# Patient Record
Sex: Male | Born: 1960 | Race: White | Hispanic: No | Marital: Married | Smoking: Never smoker
Health system: Southern US, Community
[De-identification: ages and names within clinical notes are randomized; demographics above are authoritative.]

## PROBLEM LIST (undated history)

## (undated) DIAGNOSIS — R600 Localized edema: Secondary | ICD-10-CM

## (undated) DIAGNOSIS — I10 Essential (primary) hypertension: Secondary | ICD-10-CM

## (undated) DIAGNOSIS — G4730 Sleep apnea, unspecified: Secondary | ICD-10-CM

## (undated) DIAGNOSIS — F419 Anxiety disorder, unspecified: Secondary | ICD-10-CM

## (undated) DIAGNOSIS — I4891 Unspecified atrial fibrillation: Secondary | ICD-10-CM

## (undated) DIAGNOSIS — I421 Obstructive hypertrophic cardiomyopathy: Secondary | ICD-10-CM

## (undated) DIAGNOSIS — E785 Hyperlipidemia, unspecified: Secondary | ICD-10-CM

## (undated) DIAGNOSIS — I499 Cardiac arrhythmia, unspecified: Secondary | ICD-10-CM

## (undated) DIAGNOSIS — R55 Syncope and collapse: Secondary | ICD-10-CM

## (undated) DIAGNOSIS — M109 Gout, unspecified: Secondary | ICD-10-CM

## (undated) DIAGNOSIS — R002 Palpitations: Secondary | ICD-10-CM

## (undated) HISTORY — DX: Anxiety disorder, unspecified: F41.9

## (undated) HISTORY — DX: Hyperlipidemia, unspecified: E78.5

## (undated) HISTORY — PX: TONSILLECTOMY: SUR1361

## (undated) HISTORY — DX: Syncope and collapse: R55

## (undated) HISTORY — PX: CERVICAL DISC SURGERY: SHX588

## (undated) HISTORY — DX: Gout, unspecified: M10.9

## (undated) HISTORY — DX: Sleep apnea, unspecified: G47.30

## (undated) HISTORY — DX: Obstructive hypertrophic cardiomyopathy: I42.1

## (undated) HISTORY — DX: Localized edema: R60.0

## (undated) HISTORY — DX: Palpitations: R00.2

## (undated) HISTORY — DX: Essential (primary) hypertension: I10

## (undated) HISTORY — DX: Cardiac arrhythmia, unspecified: I49.9

## (undated) HISTORY — DX: Unspecified atrial fibrillation: I48.91

---
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
## 2005-08-05 ENCOUNTER — Ambulatory Visit: Payer: Self-pay | Admitting: Cardiology

## 2005-08-27 ENCOUNTER — Encounter: Payer: Self-pay | Admitting: Internal Medicine

## 2005-08-27 ENCOUNTER — Ambulatory Visit: Payer: Self-pay

## 2005-09-16 ENCOUNTER — Ambulatory Visit: Payer: Self-pay | Admitting: Cardiology

## 2005-10-09 ENCOUNTER — Ambulatory Visit: Payer: Self-pay | Admitting: Internal Medicine

## 2008-08-17 ENCOUNTER — Encounter: Admission: RE | Admit: 2008-08-17 | Discharge: 2008-08-17 | Payer: Self-pay | Admitting: Unknown Physician Specialty

## 2008-11-03 ENCOUNTER — Encounter: Admission: RE | Admit: 2008-11-03 | Discharge: 2008-11-03 | Payer: Self-pay | Admitting: Unknown Physician Specialty

## 2009-07-02 DIAGNOSIS — R55 Syncope and collapse: Secondary | ICD-10-CM | POA: Insufficient documentation

## 2009-07-02 DIAGNOSIS — E785 Hyperlipidemia, unspecified: Secondary | ICD-10-CM | POA: Insufficient documentation

## 2009-07-02 HISTORY — DX: Syncope and collapse: R55

## 2009-07-02 HISTORY — DX: Hyperlipidemia, unspecified: E78.5

## 2009-07-03 ENCOUNTER — Ambulatory Visit: Payer: Self-pay | Admitting: Cardiology

## 2009-07-03 ENCOUNTER — Encounter: Payer: Self-pay | Admitting: Cardiology

## 2009-07-03 DIAGNOSIS — I421 Obstructive hypertrophic cardiomyopathy: Secondary | ICD-10-CM

## 2009-07-03 HISTORY — DX: Obstructive hypertrophic cardiomyopathy: I42.1

## 2009-07-31 ENCOUNTER — Ambulatory Visit: Payer: Self-pay | Admitting: Cardiology

## 2009-07-31 ENCOUNTER — Ambulatory Visit: Payer: Self-pay | Admitting: Cardiovascular Disease

## 2009-07-31 ENCOUNTER — Ambulatory Visit: Payer: Self-pay

## 2009-07-31 ENCOUNTER — Ambulatory Visit (HOSPITAL_COMMUNITY): Admission: RE | Admit: 2009-07-31 | Discharge: 2009-07-31 | Payer: Self-pay | Admitting: Cardiology

## 2009-07-31 ENCOUNTER — Encounter: Payer: Self-pay | Admitting: Cardiology

## 2009-09-18 ENCOUNTER — Ambulatory Visit: Payer: Self-pay | Admitting: Cardiology

## 2009-09-18 ENCOUNTER — Encounter: Payer: Self-pay | Admitting: Cardiology

## 2009-09-18 DIAGNOSIS — R002 Palpitations: Secondary | ICD-10-CM

## 2009-09-18 HISTORY — DX: Palpitations: R00.2

## 2009-10-15 ENCOUNTER — Telehealth (INDEPENDENT_AMBULATORY_CARE_PROVIDER_SITE_OTHER): Payer: Self-pay | Admitting: *Deleted

## 2010-05-06 NOTE — Procedures (Signed)
Summary: summary report  summary report   Imported By: Mirna Mires 08/13/2009 15:48:57  _____________________________________________________________________  External Attachment:    Type:   Image     Comment:   External Document

## 2010-05-06 NOTE — Progress Notes (Signed)
  Request Recieved from [facility] sent to [facility]  [facility]  October 15, 2009 8:53 AM

## 2010-05-06 NOTE — Assessment & Plan Note (Signed)
Summary: [facility]   Visit Type:  Follow-up   History of Present Illness: 50 year old male with hypertrophic cardiomyopathy for further evaluation. The patient has been seen in this office previously but not since June 2007. Note a Cardiolite was performed in [place] in 2006. Ejection fraction was normal as was the perfusion. His echocardiogram on Aug 27, 2005 showed marked asymmetric left hypertrophy. There is slight speckled appearance to the septal myocardium consistent with hypertrophic cardiomyopathy. There was no mitral regurgitation. 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 I did have Dr. Graciela Husbands evaluate the patient. He felt these were most likely vagal in etiology. He recommended continued beta blocker and no ICD. Since he was last seen he denies any dyspnea on exertion, orthopnea, PND, pedal edema, palpitations, chest pain or recurrent syncope.  Current Medications (verified): 1)  Metoprolol Succinate 50 Mg Xr24h-Tab (Metoprolol Succinate) .... Take One Tablet By Mouth Daily 2)  Lipitor 20 Mg Tabs (Atorvastatin Calcium) .... Take One Tablet By Mouth Daily. 3)  Aspirin 81 Mg Tbec (Aspirin) .... Take One Tablet By Mouth Daily 4)  Multivitamins  Tabs (Multiple Vitamin) .... Take 1 Tablet By Mouth Once A Day 5)  Testosterone Cypionate 100 Mg/ml Oil (Testosterone Cypionate) .Marland Kitchen.. 1 M  5th of Each Month  Allergies (verified): No Known Drug Allergies  Past History:  Past Medical History: SYNCOPE (ICD-780.2) HYPERLIPIDEMIA  Hypertrophic cardiomyopathy  Past Surgical History: Tonsillectomy Cervical Disk surgery  Family History: Reviewed history from 07/02/2009 and no changes required. Postive for CAD Father: Myocaridal infarction first at age 39 Mother CHF Siblings:DM  Social History: Reviewed history from 07/02/2009 and no changes required. Tobacco Use - No.  Alcohol Use - yes Full Time Married   Review of  Systems       Some back pain but no fevers or chills, productive cough, hemoptysis, dysphasia, odynophagia, melena, hematochezia, dysuria, hematuria, rash, seizure activity, orthopnea, PND, pedal edema, claudication. Remaining systems are negative.   Vital Signs:  Patient profile:   50 year old male Height:      70 inches Weight:      209.25 pounds BMI:     30.13 Pulse rate:   74 / minute Pulse rhythm:   regular Resp:     18 per minute BP sitting:   124 / 84  (left arm) Cuff size:   large  Vitals Entered By: Vikki Ports (July 03, 2009 2:12 PM)  Physical Exam  General:  Well developed/well nourished in NAD Skin warm/dry Patient not depressed No peripheral clubbing Back-normal HEENT-normal/normal eyelids Neck supple/normal carotid upstroke bilaterally; no bruits; no JVD; no thyromegaly chest - CTA/ normal expansion CV - RRR/normal S1 and S2; no  rubs or gallops; 2/6 systolic murmur left sternal border that increases with valsalva;  PMI nondisplaced Abdomen -NT/ND, no HSM, no mass, + bowel sounds, no bruit 2+ femoral pulses, no bruits Ext-no edema, chords, 2+ DP Neuro-grossly nonfocal     EKG  Procedure date:  07/03/2009  Findings:      Sinus rhythm at a rate of 74. Axis normal. Left ventricular hypertrophy with repolarization abnormality.  Impression & Recommendations:  Problem # 1:  HOCM (ICD-425.1) Pt is doing well from a symptomatic standpoint. I will plan to repeat his echocardiogram to assess his outflow gradient as well as left ventricular wall thickness. I will schedule him to have a stress echocardiogram both to exclude ischemia and also to make sure that he does not have  a hypotensive response to stress. Finally we'll schedule a 48 hour Holter monitor to exclude nonsustained VT. If he has significant risk factors for sudden death and we will consider referring to electrophysiologists for placement of ICD. Note he has had syncope in the past but was evaluated  by Dr. Graciela Husbands and felt this was most likely vagal. He'll continue on his beta blocker. His updated medication list for this problem includes:    Metoprolol Succinate 50 Mg Xr24h-tab (Metoprolol succinate) .Marland Kitchen... Take one tablet by mouth daily    Aspirin 81 Mg Tbec (Aspirin) .Marland Kitchen... Take one tablet by mouth daily  His updated medication list for this problem includes:    Metoprolol Succinate 50 Mg Xr24h-tab (Metoprolol succinate) .Marland Kitchen... Take one tablet by mouth daily    Aspirin 81 Mg Tbec (Aspirin) .Marland Kitchen... Take one tablet by mouth daily  Problem # 2:  SYNCOPE (ICD-780.2) Felt to be vagal in etiology previously. His updated medication list for this problem includes:    Metoprolol Succinate 50 Mg Xr24h-tab (Metoprolol succinate) .Marland Kitchen... Take one tablet by mouth daily    Aspirin 81 Mg Tbec (Aspirin) .Marland Kitchen... Take one tablet by mouth daily  Orders: Stress Echo (Stress Echo) Echocardiogram (Echo) Holter (Holter)  Problem # 3:  HYPERLIPIDEMIA (ICD-272.4) Continue statin. Lipids and liver monitored by primary care. His updated medication list for this problem includes:    Lipitor 20 Mg Tabs (Atorvastatin calcium) .Marland Kitchen... Take one tablet by mouth daily.  Patient Instructions: 1)  Your physician recommends that you schedule a follow-up appointment in: 8 WEEKS 2)  Your physician has requested that you have a stress echocardiogram. For further information please visit [email or web address]  Please follow instruction sheet as given. 3)  Your physician has requested that you have an echocardiogram.  Echocardiography is a painless test that uses sound waves to create images of your heart. It provides your doctor with information about the size and shape of your heart and how well your heart's chambers and valves are working.  This procedure takes approximately one hour. There are no restrictions for this procedure. 4)  Your physician has recommended that you wear a holter monitor.  Holter monitors are medical devices  that record the heart's electrical activity. Doctors most often use these monitors to diagnose arrhythmias. Arrhythmias are problems with the speed or rhythm of the heartbeat. The monitor is a small, portable device. You can wear one while you do your normal daily activities. This is usually used to diagnose what is causing palpitations/syncope (passing out).

## 2010-05-06 NOTE — Assessment & Plan Note (Signed)
Summary: [facility]   History of Present Illness: Pleasant  male with hypertrophic cardiomyopathy for followup.  Note a Cardiolite was performed in [place] in 2006. Ejection fraction was normal as was the perfusion.  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 I did have Dr. Graciela Husbands evaluate the patient. He felt these were most likely vagal in etiology. He recommended continued beta blocker and no ICD. I last saw him in March of 2011. We scheduled an echocardiogram which was performed in April of 2011 and revealed  severe concentric hypertrophy, consistent with hypertrophic cardiomyopathy. Systolic function was normal. . 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 Wall motion was normal; there were no regional wall motion abnormalities. There was mild left atrial enlargement. His septum was 20 mm. A Holter monitor was also performed that showed PVCs but no nonsustained ventricular tachycardia. A stress echocardiogram showed no decreased systolic blood pressure with exercise. Since he was last seen he denies any dyspnea on exertion, orthopnea, PND, pedal edema, chest pain or recurrent syncope. Note he does occasionally have palpitations by his report. These occur on a monthly basis. They're described as a brief flutter. It can last up to 2 minutes at a time. There is no associated shortness of breath, chest pain presyncope or syncope.  Current Medications (verified): 1)  Metoprolol Succinate 50 Mg Xr24h-Tab (Metoprolol Succinate) .... Take One Tablet By Mouth Daily 2)  Lipitor 20 Mg Tabs (Atorvastatin Calcium) .... Take One Tablet By Mouth Daily. 3)  Aspirin 81 Mg Tbec (Aspirin) .... Take One Tablet By Mouth Daily 4)  Multivitamins  Tabs (Multiple Vitamin) .... Take 1 Tablet By Mouth Once A Day 5)  Testosterone Cypionate 100 Mg/ml Oil  (Testosterone Cypionate) .Marland Kitchen.. 1 M  5th of Each Month  Allergies (verified): No Known Drug Allergies  Past History:  Past Medical History: Reviewed history from 07/03/2009 and no changes required. SYNCOPE (ICD-780.2) HYPERLIPIDEMIA  Hypertrophic cardiomyopathy  Past Surgical History: Reviewed history from 07/03/2009 and no changes required. Tonsillectomy Cervical Disk surgery  Social History: Reviewed history from 07/03/2009 and no changes required. Tobacco Use - No.  Alcohol Use - yes Full Time Married   Review of Systems       no fevers or chills, productive cough, hemoptysis, dysphasia, odynophagia, melena, hematochezia, dysuria, hematuria, rash, seizure activity, orthopnea, PND, pedal edema, claudication. Remaining systems are negative.   Vital Signs:  Patient profile:   50 year old male Weight:      207 pounds Pulse rate:   75 / minute Pulse rhythm:   regular BP sitting:   129 / 82  (left arm) Cuff size:   large  Vitals Entered By: Deliah Goody, RN (September 18, 2009 2:21 PM)  Physical Exam  General:  Well-developed well-nourished in no acute distress.  Skin is warm and dry.  HEENT is normal.  Neck is supple. No thyromegaly.  Chest is clear to auscultation with normal expansion.  Cardiovascular exam is regular rate and rhythm.  Abdominal exam nontender or distended. No masses palpated. Extremities show no edema. neuro grossly intact    Impression & Recommendations:  Problem # 1:  HOCM (ICD-425.1) I have reviewed the patient's symptoms. He does not have high-risk features for sudden cardiac death. He did not have a drop in systolic blood pressure with exercise, he has no family history of sudden death,  there was no nonsustained ventricular tachycardia on his Holter. He did have syncope previously but Dr. Graciela Husbands felt this was most likely vagal. He is describing palpitations. I am concerned about the possibility of VT. I will schedule a CardioNet monitor. I have  instructed him that he will need family members screened. He will continue on his beta blocker. I have instructed him on the importance of avoiding strenuous activities. His updated medication list for this problem includes:    Metoprolol Succinate 50 Mg Xr24h-tab (Metoprolol succinate) .Marland Kitchen... Take one tablet by mouth daily    Aspirin 81 Mg Tbec (Aspirin) .Marland Kitchen... Take one tablet by mouth daily  His updated medication list for this problem includes:    Metoprolol Succinate 50 Mg Xr24h-tab (Metoprolol succinate) .Marland Kitchen... Take one tablet by mouth daily    Aspirin 81 Mg Tbec (Aspirin) .Marland Kitchen... Take one tablet by mouth daily  Problem # 2:  PALPITATIONS (ICD-785.1) Schedule CardioNet monitor. His updated medication list for this problem includes:    Metoprolol Succinate 50 Mg Xr24h-tab (Metoprolol succinate) .Marland Kitchen... Take one tablet by mouth daily    Aspirin 81 Mg Tbec (Aspirin) .Marland Kitchen... Take one tablet by mouth daily  Orders: Event (Event)  Problem # 3:  HYPERLIPIDEMIA (ICD-272.4) Continue statin. His updated medication list for this problem includes:    Lipitor 20 Mg Tabs (Atorvastatin calcium) .Marland Kitchen... Take one tablet by mouth daily.  Problem # 4:  SYNCOPE (ICD-780.2) No recurrent episodes in the past 5 years. Previous evaluation by Dr. Graciela Husbands felt this was secondary to vagal event. His updated medication list for this problem includes:    Metoprolol Succinate 50 Mg Xr24h-tab (Metoprolol succinate) .Marland Kitchen... Take one tablet by mouth daily    Aspirin 81 Mg Tbec (Aspirin) .Marland Kitchen... Take one tablet by mouth daily  Patient Instructions: 1)  Your physician recommends that you schedule a follow-up appointment in: 6 MONTHS 2)  Your physician has recommended that you wear an event monitor.  Event monitors are medical devices that record the heart's electrical activity. Doctors most often use these monitors to diagnose arrhythmias. Arrhythmias are problems with the speed or rhythm of the heartbeat. The monitor is a small,  portable device. You can wear one while you do your normal daily activities. This is usually used to diagnose what is causing palpitations/syncope (passing out).

## 2010-05-07 ENCOUNTER — Ambulatory Visit: Admit: 2010-05-07 | Payer: Self-pay | Admitting: Cardiology

## 2010-05-07 ENCOUNTER — Encounter: Payer: Self-pay | Admitting: Cardiology

## 2010-05-07 ENCOUNTER — Ambulatory Visit (INDEPENDENT_AMBULATORY_CARE_PROVIDER_SITE_OTHER): Payer: BC Managed Care – PPO | Admitting: Cardiology

## 2010-05-07 DIAGNOSIS — I422 Other hypertrophic cardiomyopathy: Secondary | ICD-10-CM

## 2010-05-14 NOTE — Assessment & Plan Note (Signed)
Summary: [facility]   Visit Type:  Follow-up  CC:  No complaints.  History of Present Illness: Pleasant  male with hypertrophic cardiomyopathy for followup.  Note a Cardiolite was performed in [place] in 2006. Ejection fraction was normal as was the perfusion.  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 I did have Dr. Graciela Husbands evaluate the patient. He felt these were most likely vagal in etiology. He recommended continued beta blocker and no ICD. We scheduled an echocardiogram which was performed in April of 2011 and revealed  severe concentric hypertrophy, consistent with hypertrophic cardiomyopathy. Systolic function was normal. 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 Wall motion was normal; there were no regional wall motion abnormalities. There was mild left atrial enlargement. His septum was 20 mm. A Holter monitor was also performed that showed PVCs but no nonsustained ventricular tachycardia. A stress echocardiogram showed no decreased systolic blood pressure with exercise. I last saw him in June of 2011. Since then, hdyspnea on exertion, orthopnea, PND, pedal edema, palpitations, syncope or chest pain.  Current Medications (verified): 1)  Metoprolol Succinate 50 Mg Xr24h-Tab (Metoprolol Succinate) .... Take One Tablet By Mouth Daily 2)  Lipitor 20 Mg Tabs (Atorvastatin Calcium) .... Take One Tablet By Mouth Daily. 3)  Aspirin 81 Mg Tbec (Aspirin) .... Take One Tablet By Mouth Daily 4)  Multivitamins  Tabs (Multiple Vitamin) .... Take 1 Tablet By Mouth Once A Day 5)  Testosterone Cypionate 100 Mg/ml Oil (Testosterone Cypionate) .Marland Kitchen.. 1 M  5th of Each Month  Allergies (verified): No Known Drug Allergies  Past History:  Past Medical History: Reviewed history from 07/03/2009 and no changes required. SYNCOPE  (ICD-780.2) HYPERLIPIDEMIA  Hypertrophic cardiomyopathy  Past Surgical History: Reviewed history from 07/03/2009 and no changes required. Tonsillectomy Cervical Disk surgery  Social History: Reviewed history from 07/03/2009 and no changes required. Tobacco Use - No.  Alcohol Use - yes Full Time Married   Review of Systems       no fevers or chills, productive cough, hemoptysis, dysphasia, odynophagia, melena, hematochezia, dysuria, hematuria, rash, seizure activity, orthopnea, PND, pedal edema, claudication. Remaining systems are negative.   Vital Signs:  Patient profile:   50 year old male Height:      70 inches Weight:      205 pounds BMI:     29.52 Pulse rate:   68 / minute Pulse rhythm:   regular Resp:     18 per minute BP sitting:   130 / 88  (right arm) Cuff size:   large  Vitals Entered By: Vikki Ports (May 07, 2010 2:52 PM)  Physical Exam  General:  Well-developed well-nourished in no acute distress.  Skin is warm and dry.  HEENT is normal.  Neck is supple. No thyromegaly.  Chest is clear to auscultation with normal expansion.  Cardiovascular exam is regular rate and rhythm. heart 2/6 systolic murmur left sternal borderwith mild increase with Valsalva. Abdominal exam nontender or distended. No masses palpated. Extremities show no edema. neuro grossly intact    EKG  Procedure date:  05/07/2010  Findings:      Normal sinus rhythm at a rate of 69. Left ventricular hypertrophy with repolarization abnormality.  Impression & Recommendations:  Problem # 1:  HOCM (ICD-425.1) Plan continue beta blocker. He is not having symptoms of dyspnea and his palpitations have resolved.  He has not had risk factors for sudden cardiac death including no family history of sudden death, no decreased blood pressure with exercise, no nonsustained ventricular tachycardia on Holter. He did have syncope years ago that our electrophysiologist felt was vagally mediated. Plan  continue medical therapy. We have discussed previously the importance of avoiding strenuous exercise. His updated medication list for this problem includes:    Metoprolol Succinate 50 Mg Xr24h-tab (Metoprolol succinate) .Marland Kitchen... Take one tablet by mouth daily    Aspirin 81 Mg Tbec (Aspirin) .Marland Kitchen... Take one tablet by mouth daily  Problem # 2:  HYPERLIPIDEMIA (ICD-272.4) Continue statin. Lipids and liver monitored by his primary care. His updated medication list for this problem includes:    Lipitor 20 Mg Tabs (Atorvastatin calcium) .Marland Kitchen... Take one tablet by mouth daily.  Problem # 3:  PALPITATIONS (ICD-785.1) Resolved. Continue beta blocker. His updated medication list for this problem includes:    Metoprolol Succinate 50 Mg Xr24h-tab (Metoprolol succinate) .Marland Kitchen... Take one tablet by mouth daily    Aspirin 81 Mg Tbec (Aspirin) .Marland Kitchen... Take one tablet by mouth daily  Patient Instructions: 1)  Your physician wants you to follow-up in: ONE YEAR  You will receive a reminder letter in the mail two months in advance. If you don't receive a letter, please call our office to schedule the follow-up appointment.

## 2010-07-28 ENCOUNTER — Other Ambulatory Visit: Payer: Self-pay | Admitting: Unknown Physician Specialty

## 2010-07-28 ENCOUNTER — Ambulatory Visit
Admission: RE | Admit: 2010-07-28 | Discharge: 2010-07-28 | Disposition: A | Discharge status: Home / Self Care | Payer: BC Managed Care – PPO | Source: Ambulatory Visit | Attending: Unknown Physician Specialty | Admitting: Unknown Physician Specialty

## 2010-07-28 DIAGNOSIS — R221 Localized swelling, mass and lump, neck: Secondary | ICD-10-CM

## 2010-08-22 NOTE — Letter (Signed)
September 23, 2009     RE:  ZALMEN, WRIGHTSMAN  MRN:  [id number]  /  DOB:  1960-12-15   To whom it may concern:   Mr. Dale Rivera is a patient of mine in [place], [place].  He has  hypertrophic cardiomyopathy.  We performed a Holter monitor recently to  rule out nonsustained ventricular tachycardia to risk stratify for  sudden death.  When I saw him in followup, he was having episodes of  palpitations.  These did not occur when he had the Holter monitor on.  I  am concerned about the possibility of ventricular tachycardia.  I have  ordered a CardioNet monitor to more fully assess.   Please feel free to contact me if there are any concerns or questions  regarding this issue.    Sincerely,      Dale Rivera. Dale Som, MD, [facility]    BSC/MedQ  DD: 09/23/2009  DT: 09/23/2009  Job #: [id number]

## 2010-11-18 ENCOUNTER — Encounter: Payer: Self-pay | Admitting: Cardiology

## 2012-04-20 ENCOUNTER — Encounter: Payer: Self-pay | Admitting: Cardiology

## 2012-04-20 ENCOUNTER — Ambulatory Visit (INDEPENDENT_AMBULATORY_CARE_PROVIDER_SITE_OTHER): Payer: 59 | Admitting: Cardiology

## 2012-04-20 VITALS — BP 130/82 | HR 73 | Ht 70.0 in | Wt 198.0 lb

## 2012-04-20 DIAGNOSIS — R002 Palpitations: Secondary | ICD-10-CM

## 2012-04-20 DIAGNOSIS — I421 Obstructive hypertrophic cardiomyopathy: Secondary | ICD-10-CM

## 2012-04-20 NOTE — Assessment & Plan Note (Signed)
Plan continue beta blocker. Schedule exercise treadmill to check systolic blood pressure with exercise. Repeat echocardiogram. Previous holter showed no nonsustained ventricular tachycardia. No family history of sudden death. Previous syncope evaluated by Dr. Graciela Husbands felt to be vagal in etiology. Patient instructed that all siblings and his daughter need to be screened (daughter beginning at age 54).

## 2012-04-20 NOTE — Assessment & Plan Note (Signed)
No recurrence. Continue Toprol.

## 2012-04-20 NOTE — Assessment & Plan Note (Signed)
Continue statin. Lipids and liver monitored by primary care. 

## 2012-04-20 NOTE — Patient Instructions (Addendum)
Your physician wants you to follow-up in: ONE YEAR WITH DR Jens Som IN [place] You will receive a reminder letter in the mail two months in advance. If you don't receive a letter, please call our office to schedule the follow-up appointment.   Your physician has requested that you have an exercise tolerance test. For further information please visit [email or web address] Please also follow instruction sheet, as given.   Your physician has requested that you have an echocardiogram. Echocardiography is a painless test that uses sound waves to create images of your heart. It provides your doctor with information about the size and shape of your heart and how well your heart's chambers and valves are working. This procedure takes approximately one hour. There are no restrictions for this procedure.

## 2012-04-20 NOTE — Progress Notes (Signed)
   HPI: Pleasant  male with hypertrophic cardiomyopathy for followup.  Note a Cardiolite was performed in [place] in 2006. Ejection fraction was normal as was the perfusion.  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 I did have Dr. Graciela Husbands evaluate the patient. He felt these were most likely vagal in etiology. He recommended continued beta blocker and no ICD. We scheduled an echocardiogram which was performed in April of 2011 and revealed  severe concentric hypertrophy, consistent with hypertrophic cardiomyopathy. Systolic function was normal. 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 Wall motion was normal; there were no regional wall motion abnormalities. There was mild left atrial enlargement. His septum was 20 mm. A Holter monitor was also performed that showed PVCs but no nonsustained ventricular tachycardia. A stress echocardiogram showed no decreased systolic blood pressure with exercise. I last saw him in Feb 2012. Since then, the patient denies any dyspnea on exertion, orthopnea, PND, pedal edema, palpitations, syncope or chest pain.   Current Outpatient Prescriptions  Medication Sig Dispense Refill  . aspirin 81 MG tablet Take 81 mg by mouth daily.        Marland Kitchen atorvastatin (LIPITOR) 20 MG tablet Take 20 mg by mouth daily.        . metoprolol (TOPROL-XL) 50 MG 24 hr tablet Take 50 mg by mouth daily.        . multivitamin (THERAGRAN) per tablet Take 1 tablet by mouth daily.        Marland Kitchen testosterone cypionate (DEPOTESTOTERONE CYPIONATE) 100 MG/ML injection Inject into the muscle. Every 2 weeks         Past Medical History  Diagnosis Date  . Syncope   . HLD (hyperlipidemia)   . Cardiomyopathy     hypertrophic    Past Surgical History  Procedure Date  . Tonsillectomy   . Cervical disc surgery     History   Social History  .  Marital Status: Single    Spouse Name: N/A    Number of Children: N/A  . Years of Education: N/A   Occupational History  . Not on file.   Social History Main Topics  . Smoking status: Never Smoker   . Smokeless tobacco: Not on file     Comment: tobacco use- no   . Alcohol Use: Yes  . Drug Use: Not on file  . Sexually Active: Not on file   Other Topics Concern  . Not on file   Social History Narrative   Full time.     ROS: no fevers or chills, productive cough, hemoptysis, dysphasia, odynophagia, melena, hematochezia, dysuria, hematuria, rash, seizure activity, orthopnea, PND, pedal edema, claudication. Remaining systems are negative.  Physical Exam: Well-developed well-nourished in no acute distress.  Skin is warm and dry.  HEENT is normal.  Neck is supple.  Chest is clear to auscultation with normal expansion.  Cardiovascular exam is regular rate and rhythm. 2/6 systolic murmur left sternal border. Abdominal exam nontender or distended. No masses palpated. Extremities show no edema. neuro grossly intact  ECG sinus rhythm, biatrial enlargement, left ventricular hypertrophy, lateral T wave inversion.

## 2012-04-26 ENCOUNTER — Ambulatory Visit (HOSPITAL_COMMUNITY): Payer: 59 | Attending: Cardiology | Admitting: Radiology

## 2012-04-26 DIAGNOSIS — I421 Obstructive hypertrophic cardiomyopathy: Secondary | ICD-10-CM

## 2012-04-26 DIAGNOSIS — I517 Cardiomegaly: Secondary | ICD-10-CM | POA: Insufficient documentation

## 2012-04-26 NOTE — Progress Notes (Signed)
Echocardiogram performed.  

## 2012-05-11 ENCOUNTER — Ambulatory Visit (INDEPENDENT_AMBULATORY_CARE_PROVIDER_SITE_OTHER): Payer: 59 | Admitting: Physician Assistant

## 2012-05-11 DIAGNOSIS — I421 Obstructive hypertrophic cardiomyopathy: Secondary | ICD-10-CM

## 2012-05-11 NOTE — Progress Notes (Signed)
Exercise Treadmill Test  Pre-Exercise Testing Evaluation Rhythm: normal sinus  Rate: 78                 Test  Exercise Tolerance Test Ordering MD: Olga Millers, MD  Interpreting MD: Tereso Newcomer, PA-C  Unique Test No: 1  Treadmill:  1  Indication for ETT: HOCM  Contraindication to ETT: No   Stress Modality: exercise - treadmill  Cardiac Imaging Performed: non   Protocol: standard Bruce - maximal  Max BP:  214/84  Max MPHR (bpm):  167 85% MPR (bpm):  142  MPHR obtained (bpm):  169 % MPHR obtained:  101%  Reached 85% MPHR (min:sec):  6:17 Total Exercise Time (min-sec):  10:00  Workload in METS:  11.4 Borg Scale: 17  Reason ETT Terminated:  desired heart rate attained    ST Segment Analysis At Rest: non-specific ST segment slurring - LVH with repol abnormality (HOCM) With Exercise: significant ischemic ST depression  Other Information Arrhythmia:  No Angina during ETT:  absent (0) Quality of ETT:  non-diagnostic  ETT Interpretation:  abnormal - evidence of ST depression consistent with ischemia - likely due to LVH  Comments: Good exercise tolerance. No chest pain. Appropriate BP response to exercise. ST depression noted in V4-6 at max exercise.  Likely related to LVH. No exercised induced ventricular arrhythmias.  Recommendations: Patient with HOCM. This is a low risk stress test. Follow up with Dr. Olga Millers as directed. Signed,  Tereso Newcomer, PA-C  10:11 AM 05/11/2012

## 2012-05-21 ENCOUNTER — Other Ambulatory Visit: Payer: Self-pay

## 2012-05-26 ENCOUNTER — Telehealth: Payer: Self-pay | Admitting: Cardiology

## 2012-05-26 NOTE — Telephone Encounter (Signed)
Spoke with pt, aware of gxt report. He will follow up in one year

## 2012-05-26 NOTE — Telephone Encounter (Signed)
New problem:  Test results.  

## 2013-02-09 ENCOUNTER — Other Ambulatory Visit: Payer: Self-pay

## 2013-05-24 ENCOUNTER — Encounter: Payer: Self-pay | Admitting: Cardiology

## 2013-05-24 ENCOUNTER — Ambulatory Visit (INDEPENDENT_AMBULATORY_CARE_PROVIDER_SITE_OTHER): Payer: 59 | Admitting: Cardiology

## 2013-05-24 VITALS — BP 130/88 | HR 71 | Ht 70.0 in | Wt 201.0 lb

## 2013-05-24 DIAGNOSIS — R002 Palpitations: Secondary | ICD-10-CM

## 2013-05-24 MED ORDER — METOPROLOL SUCCINATE ER 50 MG PO TB24: ORAL_TABLET | ORAL | Status: DC

## 2013-05-24 NOTE — Assessment & Plan Note (Signed)
Continue statin. 

## 2013-05-24 NOTE — Assessment & Plan Note (Signed)
Continue beta blocker. 

## 2013-05-24 NOTE — Patient Instructions (Signed)
Your physician wants you to follow-up in: ONE YEAR WITH DR Shelda PalRENSHAW You will receive a reminder letter in the mail two months in advance. If you don't receive a letter, please call our office to schedule the follow-up appointment.   INCREASE METOPROLOL TO 75 MG = ONE AND ONE HALF OF 50 MG TABLET ONCE DAILY

## 2013-05-24 NOTE — Progress Notes (Signed)
      HPI: FU hypertrophic cardiomyopathy. Note a Cardiolite was performed in [place] in 2006. Ejection fraction was normal as was the perfusion. 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 I did have Dr. Graciela HusbandsKlein evaluate the patient. He felt these were most likely vagal in etiology. He recommended continued beta blocker and no ICD. A Holter April 2011 showed PVCs but no nonsustained ventricular tachycardia. Echo 1/14 showed normal LV function, severe asymmetric sepal hypertrophy (18mm), SAM, intracavitary gradient of 4 m/s, mild LAE. ETT 1/14 showed appropriate BP response to exercise and no arrhythmias. I last saw him in Jan 2014. Since then, the patient denies any dyspnea on exertion, orthopnea, PND, pedal edema, palpitations, syncope or chest pain.   Current Outpatient Prescriptions  Medication Sig Dispense Refill  . allopurinol (ZYLOPRIM) 100 MG tablet TAKE ONE TABLET TWICE DAILY      . aspirin 81 MG tablet Take 81 mg by mouth daily.        Marland Kitchen. atorvastatin (LIPITOR) 20 MG tablet Take 20 mg by mouth daily.        . metoprolol (TOPROL-XL) 50 MG 24 hr tablet Take 50 mg by mouth daily.        . multivitamin (THERAGRAN) per tablet Take 1 tablet by mouth daily.        Marland Kitchen. testosterone cypionate (DEPOTESTOTERONE CYPIONATE) 100 MG/ML injection Inject into the muscle. Every 2 weeks       No current facility-administered medications for this visit.     Past Medical History  Diagnosis Date  . Syncope   . HLD (hyperlipidemia)   . Cardiomyopathy     hypertrophic    Past Surgical History  Procedure Laterality Date  . Tonsillectomy    . Cervical disc surgery      History   Social History  . Marital Status: Single    Spouse Name: N/A    Number of Children: N/A  . Years of Education: N/A   Occupational History  . Not on file.   Social History Main Topics  . Smoking status: Never Smoker   . Smokeless tobacco: Not on file     Comment:  tobacco use- no   . Alcohol Use: Yes  . Drug Use: Not on file  . Sexual Activity: Not on file   Other Topics Concern  . Not on file   Social History Narrative   Full time.     ROS: no fevers or chills, productive cough, hemoptysis, dysphasia, odynophagia, melena, hematochezia, dysuria, hematuria, rash, seizure activity, orthopnea, PND, pedal edema, claudication. Remaining systems are negative.  Physical Exam: Well-developed well-nourished in no acute distress.  Skin is warm and dry.  HEENT is normal.  Neck is supple.  Chest is clear to auscultation with normal expansion.  Cardiovascular exam is regular rate and rhythm. 2/6 systolic murmur that increases with Valsalva. Abdominal exam nontender or distended. No masses palpated. Extremities show no edema. neuro grossly intact  ECG sinus rhythm at a rate of 71. Left ventricular hypertrophy with repolarization abnormality.

## 2013-05-24 NOTE — Assessment & Plan Note (Signed)
Plan continue beta blocker but increase Toprol to 75 mg daily. Previous exercise treadmill showed normal blood pressure response. Previous holter showed no nonsustained ventricular tachycardia. No family history of sudden death. Previous syncope evaluated by Dr. Graciela HusbandsKlein felt to be vagal in etiology. Patient instructed that all siblings and his daughter need to be screened (daughter beginning at age 55). I will plan repeat exercise treadmill, echocardiogram and Holter monitor when he returns in one year.

## 2013-11-15 ENCOUNTER — Ambulatory Visit (INDEPENDENT_AMBULATORY_CARE_PROVIDER_SITE_OTHER): Payer: 59 | Admitting: Cardiology

## 2013-11-15 ENCOUNTER — Encounter: Payer: Self-pay | Admitting: *Deleted

## 2013-11-15 ENCOUNTER — Encounter: Payer: Self-pay | Admitting: Cardiology

## 2013-11-15 VITALS — BP 134/80 | HR 65 | Ht 70.0 in | Wt 208.8 lb

## 2013-11-15 DIAGNOSIS — R002 Palpitations: Secondary | ICD-10-CM

## 2013-11-15 DIAGNOSIS — R600 Localized edema: Secondary | ICD-10-CM

## 2013-11-15 DIAGNOSIS — I421 Obstructive hypertrophic cardiomyopathy: Secondary | ICD-10-CM

## 2013-11-15 DIAGNOSIS — R609 Edema, unspecified: Secondary | ICD-10-CM

## 2013-11-15 HISTORY — DX: Localized edema: R60.0

## 2013-11-15 NOTE — Patient Instructions (Signed)
Your physician recommends that you schedule a follow-up appointment in: 8 WEEKS [facility]  Your physician has requested that you have an exercise tolerance test. For further information please visit [email or web address]. Please also follow instruction sheet, as given.   Your physician has requested that you have an echocardiogram. Echocardiography is a painless test that uses sound waves to create images of your heart. It provides your doctor with information about the size and shape of your heart and how well your heart's chambers and valves are working. This procedure takes approximately one hour. There are no restrictions for this procedure.   Your physician has recommended that you wear an event monitor. Event monitors are medical devices that record the heart's electrical activity. Doctors most often us these monitors to diagnose arrhythmias. Arrhythmias are problems with the speed or rhythm of the heartbeat. The monitor is a small, portable device. You can wear one while you do your normal daily activities. This is usually used to diagnose what is causing palpitations/syncope (passing out).  Your physician has requested that you have a lower extremity venous duplex. This test is an ultrasound of the veins in the legs or arms. It looks at venous blood flow that carries blood from the heart to the legs or arms. Allow one hour for a Lower Venous exam. Allow thirty minutes for an Upper Venous exam. There are no restrictions or special instructions.

## 2013-11-15 NOTE — Assessment & Plan Note (Signed)
Schedule ultrasound to exclude DVT.

## 2013-11-15 NOTE — Assessment & Plan Note (Signed)
Continue statin. 

## 2013-11-15 NOTE — Progress Notes (Signed)
      HPI: FU hypertrophic cardiomyopathy. Note a Cardiolite was performed in [place] in 2006. Ejection fraction was normal as was the perfusion. 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 I did have Dr. Graciela HusbandsKlein evaluate the patient. He felt these were most likely vagal in etiology. He recommended continued beta blocker and no ICD. A Holter April 2011 showed PVCs but no nonsustained ventricular tachycardia. Echo 1/14 showed normal LV function, severe asymmetric sepal hypertrophy (18mm), SAM, intracavitary gradient of 4 m/s, mild LAE. ETT 1/14 showed appropriate BP response to exercise and no arrhythmias. Since I last saw him, He has minimal dyspnea on exertion. No chest pain. He occasionally has brief palpitations. Approximately one week ago he had palpitations for one hour associated with diaphoresis and mild dizziness. No syncope. He also had swelling in his left lower extremity for 3 days that has resolved. No recent travel or trauma.   Current Outpatient Prescriptions  Medication Sig Dispense Refill  . allopurinol (ZYLOPRIM) 100 MG tablet TAKE ONE TABLET TWICE DAILY      . aspirin 81 MG tablet Take 81 mg by mouth daily.        Marland Kitchen. atorvastatin (LIPITOR) 20 MG tablet Take 20 mg by mouth daily.        . metoprolol succinate (TOPROL-XL) 50 MG 24 hr tablet TAKE ONE AND ONE HALF TABLET ONCE DAILY  135 tablet  3  . testosterone cypionate (DEPOTESTOTERONE CYPIONATE) 100 MG/ML injection Inject into the muscle. Every 2 weeks       No current facility-administered medications for this visit.     Past Medical History  Diagnosis Date  . Syncope   . HLD (hyperlipidemia)   . Cardiomyopathy     hypertrophic    Past Surgical History  Procedure Laterality Date  . Tonsillectomy    . Cervical disc surgery      History   Social History  . Marital Status: Single    Spouse Name: N/A    Number of Children: N/A  . Years of Education: N/A    Occupational History  . Not on file.   Social History Main Topics  . Smoking status: Never Smoker   . Smokeless tobacco: Not on file     Comment: tobacco use- no   . Alcohol Use: Yes  . Drug Use: Not on file  . Sexual Activity: Not on file   Other Topics Concern  . Not on file   Social History Narrative   Full time.     ROS: no fevers or chills, productive cough, hemoptysis, dysphasia, odynophagia, melena, hematochezia, dysuria, hematuria, rash, seizure activity, orthopnea, PND, pedal edema, claudication. Remaining systems are negative.  Physical Exam: Well-developed well-nourished in no acute distress.  Skin is warm and dry.  HEENT is normal.  Neck is supple.  Chest is clear to auscultation with normal expansion.  Cardiovascular exam is regular rate and rhythm. 2/6 systolic murmur left sternal border that increases with Valsalva. Abdominal exam nontender or distended. No masses palpated. Extremities show no edema. No chords neuro grossly intact  ECG Sinus rhythm, left ventricular hypertrophy with repolarization abnormality.

## 2013-11-15 NOTE — Assessment & Plan Note (Signed)
Patient complains of a prolonged episode of palpitations. Schedule [facility]. Question atrial fibrillation. This will also help evaluate for nonsustained ventricular tachycardia given hypertrophic cardiopathy.

## 2013-11-15 NOTE — Assessment & Plan Note (Signed)
Plan continue beta blocker. Repeat echocardiogram. Exercise treadmill to evaluate systolic blood pressure with exercise. No Family history of sudden death. Previous syncope felt vagal.

## 2013-11-21 ENCOUNTER — Encounter (INDEPENDENT_AMBULATORY_CARE_PROVIDER_SITE_OTHER): Payer: 59

## 2013-11-21 ENCOUNTER — Ambulatory Visit (HOSPITAL_BASED_OUTPATIENT_CLINIC_OR_DEPARTMENT_OTHER): Payer: 59 | Admitting: Cardiology

## 2013-11-21 ENCOUNTER — Ambulatory Visit (HOSPITAL_COMMUNITY): Payer: 59 | Attending: Cardiology

## 2013-11-21 ENCOUNTER — Encounter: Payer: Self-pay | Admitting: *Deleted

## 2013-11-21 DIAGNOSIS — I421 Obstructive hypertrophic cardiomyopathy: Secondary | ICD-10-CM

## 2013-11-21 DIAGNOSIS — M7989 Other specified soft tissue disorders: Secondary | ICD-10-CM

## 2013-11-21 DIAGNOSIS — R002 Palpitations: Secondary | ICD-10-CM

## 2013-11-21 DIAGNOSIS — R609 Edema, unspecified: Secondary | ICD-10-CM

## 2013-11-21 DIAGNOSIS — M79606 Pain in leg, unspecified: Secondary | ICD-10-CM

## 2013-11-21 DIAGNOSIS — M79609 Pain in unspecified limb: Secondary | ICD-10-CM

## 2013-11-21 NOTE — Progress Notes (Signed)
Unilateral lower venous duplex performed  

## 2013-11-21 NOTE — Progress Notes (Signed)
Patient ID: Dale CollumBrian Rivera, male   DOB: [birth year], 53 y.o.   MRN: [id number] [employer] 30 day cardiac event monitor applied to patient.

## 2013-11-21 NOTE — Progress Notes (Signed)
2D Echo completed. 11/21/2013 

## 2013-12-19 ENCOUNTER — Ambulatory Visit (INDEPENDENT_AMBULATORY_CARE_PROVIDER_SITE_OTHER): Payer: 59 | Admitting: Physician Assistant

## 2013-12-19 DIAGNOSIS — I421 Obstructive hypertrophic cardiomyopathy: Secondary | ICD-10-CM

## 2013-12-19 DIAGNOSIS — R002 Palpitations: Secondary | ICD-10-CM

## 2013-12-19 NOTE — Progress Notes (Signed)
Exercise Treadmill Test  Pre-Exercise Testing Evaluation Rhythm: normal sinus  Rate: 65 bpm     Test  Exercise Tolerance Test Ordering MD: Olga Millers, MD  Interpreting MD: Tereso Newcomer, PA-C  Unique Test No: 1  Treadmill:  1  Indication for ETT: HOCM  Contraindication to ETT: No   Stress Modality: exercise - treadmill  Cardiac Imaging Performed: non   Protocol: standard Bruce - maximal  Max BP:  191/74  Max MPHR (bpm):  165 85% MPR (bpm):  140  MPHR obtained (bpm):  150 % MPHR obtained:  90  Reached 85% MPHR (min:sec):  7:14 Total Exercise Time (min-sec):  9:00  Workload in METS:  10.1 Borg Scale: 15  Reason ETT Terminated:  desired heart rate attained    ST Segment Analysis At Rest: LVH with repolarization abnormality - TWI in 1, 2, aVL, V5-6 With Exercise: significant ischemic ST depression  Other Information Arrhythmia:  No Angina during ETT:  absent (0) Quality of ETT:  diagnostic  ETT Interpretation:  abnormal - evidence of ST depression consistent with ischemia  Comments: Good exercise capacity. No chest pain. Normal BP response to exercise. There was significant (2-3 mm) inferolateral ST depression at peak exercise (likely related to LVH). No exercise induced ventricular arrhythmias.  Recommendations: ETT done for routine FU of HOCM. There was appropriate BP response to exercise and no exercise induced ventricular arrhythmias - low risk ETT. Test not diagnostic for ischemia given baseline changes. FU with Dr. Olga Millers as planned. Signed,  Tereso Newcomer, PA-C   12/19/2013 9:39 AM

## 2014-01-02 ENCOUNTER — Telehealth: Payer: Self-pay | Admitting: *Deleted

## 2014-01-02 NOTE — Telephone Encounter (Signed)
Monitor reviewed by dr crenshaw shows sinus with pac's.  pt aware of results  

## 2014-01-10 ENCOUNTER — Encounter: Payer: Self-pay | Admitting: Cardiology

## 2014-01-10 ENCOUNTER — Ambulatory Visit (INDEPENDENT_AMBULATORY_CARE_PROVIDER_SITE_OTHER): Payer: 59 | Admitting: Cardiology

## 2014-01-10 VITALS — BP 100/70 | HR 60 | Ht 70.0 in | Wt 209.0 lb

## 2014-01-10 DIAGNOSIS — I421 Obstructive hypertrophic cardiomyopathy: Secondary | ICD-10-CM

## 2014-01-10 DIAGNOSIS — R002 Palpitations: Secondary | ICD-10-CM

## 2014-01-10 NOTE — Progress Notes (Signed)
      HPI: FU hypertrophic cardiomyopathy. Note a Cardiolite was performed in [place] in 2006. Ejection fraction was normal as was the perfusion. 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 I did have Dr. Graciela HusbandsKlein evaluate the patient. He felt these were most likely vagal in etiology. He recommended continued beta blocker and no ICD. Echocardiogram August 2015 showed normal LV function. There was asymmetric septal hypertrophy. There was grade 2 diastolic dysfunction. There was moderate left atrial enlargement. No left ventricular outflow tract gradient was noted. Exercise treadmill September 2015 showed appropriate blood pressure response to exercise and no exercise induced ventricular arrhythmias. Lower extremity ultrasound showed no DVT. Monitor in September 2015 showed sinus with pacs. Since I last saw him, the patient denies any dyspnea on exertion, orthopnea, PND, pedal edema, palpitations, syncope or chest pain.    Current Outpatient Prescriptions  Medication Sig Dispense Refill  . allopurinol (ZYLOPRIM) 100 MG tablet TAKE ONE TABLET TWICE DAILY      . aspirin 81 MG tablet Take 81 mg by mouth daily.        Marland Kitchen. atorvastatin (LIPITOR) 20 MG tablet Take 20 mg by mouth daily.        . metoprolol succinate (TOPROL-XL) 50 MG 24 hr tablet TAKE ONE AND ONE HALF TABLET ONCE DAILY  135 tablet  3  . testosterone cypionate (DEPOTESTOTERONE CYPIONATE) 100 MG/ML injection Inject into the muscle. Every 2 weeks       No current facility-administered medications for this visit.     Past Medical History  Diagnosis Date  . Syncope   . HLD (hyperlipidemia)   . Cardiomyopathy     hypertrophic    Past Surgical History  Procedure Laterality Date  . Tonsillectomy    . Cervical disc surgery      History   Social History  . Marital Status: Single    Spouse Name: N/A    Number of Children: N/A  . Years of Education: N/A   Occupational History  . Not on  file.   Social History Main Topics  . Smoking status: Never Smoker   . Smokeless tobacco: Not on file     Comment: tobacco use- no   . Alcohol Use: Yes  . Drug Use: Not on file  . Sexual Activity: Not on file   Other Topics Concern  . Not on file   Social History Narrative   Full time.     ROS: no fevers or chills, productive cough, hemoptysis, dysphasia, odynophagia, melena, hematochezia, dysuria, hematuria, rash, seizure activity, orthopnea, PND, pedal edema, claudication. Remaining systems are negative.  Physical Exam: Well-developed well-nourished in no acute distress.  Skin is warm and dry.  HEENT is normal.  Neck is supple.  Chest is clear to auscultation with normal expansion.  Cardiovascular exam is regular rate and rhythm. 2/6 systolic murmur left sternal border. Increase with Valsalva. Abdominal exam nontender or distended. No masses palpated. Extremities show no edema. neuro grossly intact

## 2014-01-10 NOTE — Assessment & Plan Note (Signed)
Monitor showed occasional PACs. Continue beta blocker. I have instructed him that if he develops significant palpitations in the future it would be beneficial to have a rhythm strip or electrocardiogram if he is able to get to urgent care.

## 2014-01-10 NOTE — Patient Instructions (Signed)
Your physician wants you to follow-up in: ONE YEAR WITH DR CRENSHAW You will receive a reminder letter in the mail two months in advance. If you don't receive a letter, please call our office to schedule the follow-up appointment.  

## 2014-01-10 NOTE — Assessment & Plan Note (Signed)
Continue statin. 

## 2014-01-10 NOTE — Assessment & Plan Note (Signed)
Plan continue beta blocker. No Family history of sudden death. Exercise treadmill showed normal increase in systolic blood pressure with exercise. Monitor did not show nonsustained ventricular tachycardia. Previous syncope felt vagal. Plan continue medical therapy.

## 2014-06-25 NOTE — Progress Notes (Signed)
      HPI: FU hypertrophic cardiomyopathy. Note a Cardiolite was performed in [place] in 2006. Ejection fraction was normal as was the perfusion. 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 I did have Dr. Graciela HusbandsKlein evaluate the patient. He felt these were most likely vagal in etiology. He recommended continued beta blocker and no ICD. Echocardiogram August 2015 showed normal LV function. There was asymmetric septal hypertrophy. There was grade 2 diastolic dysfunction. There was moderate left atrial enlargement. No left ventricular outflow tract gradient was noted. Exercise treadmill September 2015 showed appropriate blood pressure response to exercise and no exercise induced ventricular arrhythmias. Lower extremity ultrasound showed no DVT. Monitor in September 2015 showed sinus with pacs. Since I last saw him, he has some fatigue but denies dyspnea, chest pain, palpitations or syncope.  Current Outpatient Prescriptions  Medication Sig Dispense Refill  . allopurinol (ZYLOPRIM) 100 MG tablet TAKE ONE TABLET TWICE DAILY    . aspirin 81 MG tablet Take 81 mg by mouth daily.      Marland Kitchen. atorvastatin (LIPITOR) 20 MG tablet Take 20 mg by mouth daily.      . metoprolol succinate (TOPROL-XL) 50 MG 24 hr tablet TAKE ONE AND ONE HALF TABLET ONCE DAILY 135 tablet 3  . testosterone cypionate (DEPOTESTOTERONE CYPIONATE) 100 MG/ML injection Inject into the muscle. Every 2 weeks     No current facility-administered medications for this visit.     Past Medical History  Diagnosis Date  . Syncope   . HLD (hyperlipidemia)   . Cardiomyopathy     hypertrophic    Past Surgical History  Procedure Laterality Date  . Tonsillectomy    . Cervical disc surgery      History   Social History  . Marital Status: Single    Spouse Name: N/A  . Number of Children: N/A  . Years of Education: N/A   Occupational History  . Not on file.   Social History Main Topics  .  Smoking status: Never Smoker   . Smokeless tobacco: Not on file     Comment: tobacco use- no   . Alcohol Use: Yes  . Drug Use: Not on file  . Sexual Activity: Not on file   Other Topics Concern  . Not on file   Social History Narrative   Full time.     ROS: no fevers or chills, productive cough, hemoptysis, dysphasia, odynophagia, melena, hematochezia, dysuria, hematuria, rash, seizure activity, orthopnea, PND, pedal edema, claudication. Remaining systems are negative.  Physical Exam: Well-developed well-nourished in no acute distress.  Skin is warm and dry.  HEENT is normal.  Neck is supple.  Chest is clear to auscultation with normal expansion.  Cardiovascular exam is regular rate and rhythm. 2/6 systolic murmur left sternal border that increases with Valsalva. Abdominal exam nontender or distended. No masses palpated. Extremities show no edema. neuro grossly intact  ECG sinus rhythm at a rate of 68. Left ventricular hypertrophy with repolarization abnormality. Right atrial enlargement.

## 2014-06-27 ENCOUNTER — Encounter: Payer: Self-pay | Admitting: Cardiology

## 2014-06-27 ENCOUNTER — Ambulatory Visit (INDEPENDENT_AMBULATORY_CARE_PROVIDER_SITE_OTHER): Payer: 59 | Admitting: Cardiology

## 2014-06-27 VITALS — BP 140/86 | HR 68 | Ht 70.0 in | Wt 215.8 lb

## 2014-06-27 DIAGNOSIS — R002 Palpitations: Secondary | ICD-10-CM | POA: Diagnosis not present

## 2014-06-27 DIAGNOSIS — I421 Obstructive hypertrophic cardiomyopathy: Secondary | ICD-10-CM | POA: Diagnosis not present

## 2014-06-27 NOTE — Assessment & Plan Note (Signed)
Plan continue beta blocker. No Family history of sudden death. Exercise treadmill showed normal increase in systolic blood pressure with exercise. Monitor did not show nonsustained ventricular tachycardia. Previous syncope felt vagal. Plan continue medical therapy.

## 2014-06-27 NOTE — Assessment & Plan Note (Signed)
Continue statin. 

## 2014-06-27 NOTE — Patient Instructions (Signed)
Your physician wants you to follow-up in: ONE YEAR WITH DR CRENSHAW You will receive a reminder letter in the mail two months in advance. If you don't receive a letter, please call our office to schedule the follow-up appointment.  

## 2014-06-27 NOTE — Assessment & Plan Note (Signed)
Continue beta blocker. 

## 2014-06-28 ENCOUNTER — Other Ambulatory Visit: Payer: Self-pay | Admitting: Cardiology

## 2015-08-21 ENCOUNTER — Other Ambulatory Visit: Payer: Self-pay | Admitting: *Deleted

## 2015-08-21 MED ORDER — METOPROLOL SUCCINATE ER 50 MG PO TB24: ORAL_TABLET | ORAL | Status: DC

## 2015-08-21 NOTE — Telephone Encounter (Signed)
Rx request sent to pharmacy.  

## 2015-08-21 NOTE — Telephone Encounter (Signed)
Pt requests a short term rx for metoprolol be sent to [facility] and a ninety day supply to [facility].

## 2015-08-28 ENCOUNTER — Other Ambulatory Visit: Payer: Self-pay | Admitting: Cardiology

## 2015-08-28 MED ORDER — METOPROLOL SUCCINATE ER 50 MG PO TB24: ORAL_TABLET | ORAL | Status: DC

## 2015-08-28 NOTE — Telephone Encounter (Signed)
toprol Rx(s) sent to pharmacy electronically.

## 2015-09-01 ENCOUNTER — Other Ambulatory Visit: Payer: Self-pay | Admitting: Cardiology

## 2015-09-23 ENCOUNTER — Other Ambulatory Visit: Payer: Self-pay | Admitting: Cardiology

## 2015-10-12 ENCOUNTER — Other Ambulatory Visit: Payer: Self-pay | Admitting: Cardiology

## 2015-10-14 NOTE — Telephone Encounter (Signed)
metoprolol succinate (TOPROL-XL) 50 MG 24 hr tablet  Medication   Date: 09/23/2015  [facility]  Ordering/Authorizing: Lewayne BuntingBrian S Crenshaw, MD      Order Providers    Prescribing Provider Encounter Provider   Lewayne BuntingBrian S Crenshaw, MD Lewayne BuntingBrian S Crenshaw, MD    Medication Detail      Disp Refills Start End     metoprolol succinate (TOPROL-XL) 50 MG 24 hr tablet 45 tablet 1 09/23/2015     Sig: Take 1 and 1/2 tablets by mouth once a day. Needs appointment for future refills.    E-Prescribing Status: Receipt confirmed by pharmacy (09/23/2015 4:26 PM EDT)     [facility]    [facility] [place] [id number]

## 2015-10-15 ENCOUNTER — Other Ambulatory Visit: Payer: Self-pay | Admitting: Cardiology

## 2016-02-02 ENCOUNTER — Other Ambulatory Visit: Payer: Self-pay | Admitting: Cardiology

## 2016-03-18 ENCOUNTER — Other Ambulatory Visit: Payer: Self-pay | Admitting: Cardiology

## 2016-05-29 DIAGNOSIS — G8929 Other chronic pain: Secondary | ICD-10-CM | POA: Insufficient documentation

## 2016-05-29 DIAGNOSIS — N529 Male erectile dysfunction, unspecified: Secondary | ICD-10-CM | POA: Insufficient documentation

## 2016-05-29 DIAGNOSIS — M25511 Pain in right shoulder: Secondary | ICD-10-CM | POA: Insufficient documentation

## 2016-12-28 NOTE — Progress Notes (Signed)
HPI: FU hypertrophic cardiomyopathy. Note a Cardiolite was performed in [place] in 2006. Ejection fraction was normal as was the perfusion. 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 I did have Dr. Graciela Husbands evaluate the patient. He felt these were most likely vagal in etiology. He recommended continued beta blocker and no ICD. Echocardiogram August 2015 showed normal LV function. There was asymmetric septal hypertrophy. There was grade 2 diastolic dysfunction. There was moderate left atrial enlargement. No left ventricular outflow tract gradient was noted. Exercise treadmill September 2015 showed appropriate blood pressure response to exercise and no exercise induced ventricular arrhythmias. Monitor in September 2015 showed sinus with pacs. Since I last saw him, patient has noticed some increased dyspnea on exertion and he is concerned about coronary disease given family history in his father. He denies orthopnea, PND, pedal edema, palpitations, syncope or chest pain.  Current Outpatient Prescriptions  Medication Sig Dispense Refill  . allopurinol (ZYLOPRIM) 100 MG tablet TAKE ONE TABLET TWICE DAILY    . aspirin 81 MG tablet Take 81 mg by mouth daily.      Marland Kitchen atorvastatin (LIPITOR) 20 MG tablet Take 20 mg by mouth daily.      . metoprolol succinate (TOPROL-XL) 50 MG 24 hr tablet TAKE 1 AND 1/2 TABLETS BY  MOUTH ONCE A DAY 45 tablet 3  . Testosterone (ANDROGEL PUMP) 20.25 MG/ACT (1.62%) GEL Place onto the skin. 3 pumps per day     No current facility-administered medications for this visit.      Past Medical History:  Diagnosis Date  . Cardiomyopathy    hypertrophic  . HYPERLIPIDEMIA 07/02/2009   Qualifier: Diagnosis of  By: Jens Som, MD, Lyn Hollingshead   . Hypertrophic obstructive cardiomyopathy (HCC) 07/03/2009   Qualifier: Diagnosis of  By: Jens Som, MD, Lyn Hollingshead   . Lower extremity edema 11/15/2013  . Palpitations  09/18/2009   Qualifier: Diagnosis of  By: Jens Som, MD, Lyn Hollingshead   . SYNCOPE 07/02/2009   Qualifier: Diagnosis of  By: Jens Som, MD, Lyn Hollingshead     Past Surgical History:  Procedure Laterality Date  . CERVICAL DISC SURGERY    . TONSILLECTOMY      Social History   Social History  . Marital status: Single    Spouse name: N/A  . Number of children: N/A  . Years of education: N/A   Occupational History  . Not on file.   Social History Main Topics  . Smoking status: Never Smoker  . Smokeless tobacco: Not on file     Comment: tobacco use- no   . Alcohol use Yes  . Drug use: Unknown  . Sexual activity: Not on file   Other Topics Concern  . Not on file   Social History Narrative   Full time.     Family History  Problem Relation Age of Onset  . Heart failure Mother   . Coronary artery disease Other   . Diabetes Other        siblings    ROS: no fevers or chills, productive cough, hemoptysis, dysphasia, odynophagia, melena, hematochezia, dysuria, hematuria, rash, seizure activity, orthopnea, PND, pedal edema, claudication. Remaining systems are negative.  Physical Exam: Well-developed well-nourished in no acute distress.  Skin is warm and dry.  HEENT is normal.  Neck is supple.  Chest is clear to auscultation with normal expansion.  Cardiovascular exam is regular rate and rhythm.  2/6 systolic murmur left  sternal border that increases with Valsalva.  Abdominal exam nontender or distended. No masses palpated. Extremities show no edema. neuro grossly intact  ECG- Sinus rhythm at a rate of 65. Left ventricular hypertrophy with repolarization abnormality. personally reviewed  A/P  1 Hypertrophic obstructive cardiomyopathy-continue beta blocker. However I will reduce dose to 50 mg daily as he is having some issues with erectile dysfunction. Patient does not have risk factors for sudden death. He has no family history of sudden death and previous  exercise treadmill showed normal increase in systolic blood pressure with exercise. Monitor did not show nonsustained ventricular tachycardia. We will plan to repeat echocardiogram and 24-hour Holter monitor. Note he had a previous syncopal episode that was felt vagal in etiology. I will arrange an exercise nuclear study both to evaluate systolic blood pressure with exercise and to exclude ischemia.   2 palpitations-controlled. Continue beta blocker.  3 hyperlipidemia-continue statin.   4 hypertension-blood pressure is mildly elevated. I am decreasing metoprolol as outlined above. Add amlodipine 5 mg daily and follow.  5 dyspnea-patient does have a family history of coronary disease. Plan exercise nuclear study for risk stratification.  Olga Millers, MD

## 2017-01-06 ENCOUNTER — Ambulatory Visit (INDEPENDENT_AMBULATORY_CARE_PROVIDER_SITE_OTHER): Payer: 59 | Admitting: Cardiology

## 2017-01-06 ENCOUNTER — Encounter: Payer: Self-pay | Admitting: Cardiology

## 2017-01-06 VITALS — BP 130/91 | HR 65 | Ht 70.0 in | Wt 214.0 lb

## 2017-01-06 DIAGNOSIS — I10 Essential (primary) hypertension: Secondary | ICD-10-CM | POA: Diagnosis not present

## 2017-01-06 DIAGNOSIS — E7800 Pure hypercholesterolemia, unspecified: Secondary | ICD-10-CM | POA: Diagnosis not present

## 2017-01-06 DIAGNOSIS — I421 Obstructive hypertrophic cardiomyopathy: Secondary | ICD-10-CM

## 2017-01-06 DIAGNOSIS — R0600 Dyspnea, unspecified: Secondary | ICD-10-CM

## 2017-01-06 MED ORDER — METOPROLOL SUCCINATE ER 50 MG PO TB24: 50.0000 mg | ORAL_TABLET | Freq: Every day | ORAL | 3 refills | Status: DC

## 2017-01-06 MED ORDER — AMLODIPINE BESYLATE 5 MG PO TABS: 5.0000 mg | ORAL_TABLET | Freq: Every day | ORAL | 3 refills | Status: DC

## 2017-01-06 NOTE — Patient Instructions (Signed)
Medication Instructions:   DECREASE METOPROLOL TO 50 MG ONCE DAILY  START AMLODIPINE 5 MG ONCE DAILY  Testing/Procedures:  Your physician has requested that you have en exercise stress myoview. For further information please visit [email or web address] Please follow instruction sheet, as given.   Your physician has requested that you have an echocardiogram. Echocardiography is a painless test that uses sound waves to create images of your heart. It provides your doctor with information about the size and shape of your heart and how well your heart's chambers and valves are working. This procedure takes approximately one hour. There are no restrictions for this procedure.   Your physician has recommended that you wear a 24 HOUR holter monitor. Holter monitors are medical devices that record the heart's electrical activity. Doctors most often use these monitors to diagnose arrhythmias. Arrhythmias are problems with the speed or rhythm of the heartbeat. The monitor is a small, portable device. You can wear one while you do your normal daily activities. This is usually used to diagnose what is causing palpitations/syncope (passing out).    Follow-Up:  Your physician wants you to follow-up in: ONE YEAR WITH DR Shelda Pal will receive a reminder letter in the mail two months in advance. If you don't receive a letter, please call our office to schedule the follow-up appointment.     If you need a refill on your cardiac medications before your next appointment, please call your pharmacy.

## 2017-01-12 ENCOUNTER — Telehealth (HOSPITAL_COMMUNITY): Payer: Self-pay | Admitting: *Deleted

## 2017-01-12 NOTE — Telephone Encounter (Signed)
Patient given detailed instructions per Myocardial Perfusion Study Information Sheet for the test on 01/15/17 at 0845. Patient notified to arrive 15 minutes early and that it is imperative to arrive on time for appointment to keep from having the test rescheduled.  If you need to cancel or reschedule your appointment, please call the office within 24 hours of your appointment. . Patient verbalized understanding.[name], Adelene Idler

## 2017-01-15 ENCOUNTER — Ambulatory Visit (HOSPITAL_BASED_OUTPATIENT_CLINIC_OR_DEPARTMENT_OTHER): Payer: 59

## 2017-01-15 ENCOUNTER — Other Ambulatory Visit: Payer: Self-pay

## 2017-01-15 ENCOUNTER — Ambulatory Visit (HOSPITAL_COMMUNITY): Payer: 59 | Attending: Internal Medicine

## 2017-01-15 DIAGNOSIS — Z8249 Family history of ischemic heart disease and other diseases of the circulatory system: Secondary | ICD-10-CM | POA: Insufficient documentation

## 2017-01-15 DIAGNOSIS — R0609 Other forms of dyspnea: Secondary | ICD-10-CM | POA: Diagnosis not present

## 2017-01-15 DIAGNOSIS — I421 Obstructive hypertrophic cardiomyopathy: Secondary | ICD-10-CM | POA: Diagnosis not present

## 2017-01-15 DIAGNOSIS — E785 Hyperlipidemia, unspecified: Secondary | ICD-10-CM | POA: Diagnosis not present

## 2017-01-15 DIAGNOSIS — R002 Palpitations: Secondary | ICD-10-CM | POA: Insufficient documentation

## 2017-01-15 DIAGNOSIS — I119 Hypertensive heart disease without heart failure: Secondary | ICD-10-CM | POA: Insufficient documentation

## 2017-01-15 LAB — MYOCARDIAL PERFUSION IMAGING
Estimated workload: 10 METS
Exercise duration (min): 9 min
LV dias vol: 125 mL (ref 62–150)
LV sys vol: 91 mL
MPHR: 162 {beats}/min
Peak HR: 157 {beats}/min
Percent HR: 96 %
RATE: 0.28
RPE: 15
Rest HR: 64 {beats}/min
SDS: 8
SRS: 0
SSS: 8
TID: 1.15

## 2017-01-15 MED ORDER — TECHNETIUM TC 99M TETROFOSMIN IV KIT
31.7000 | PACK | Freq: Once | INTRAVENOUS | Status: AC | PRN
  Administered 2017-01-15: 31.7 via INTRAVENOUS
  Filled 2017-01-15: qty 32

## 2017-01-15 MED ORDER — TECHNETIUM TC 99M TETROFOSMIN IV KIT
10.8000 | PACK | Freq: Once | INTRAVENOUS | Status: AC | PRN
  Administered 2017-01-15: 10.8 via INTRAVENOUS
  Filled 2017-01-15: qty 11

## 2017-01-19 ENCOUNTER — Other Ambulatory Visit: Payer: Self-pay | Admitting: Cardiology

## 2017-01-19 ENCOUNTER — Ambulatory Visit (INDEPENDENT_AMBULATORY_CARE_PROVIDER_SITE_OTHER): Payer: 59

## 2017-01-19 DIAGNOSIS — R0609 Other forms of dyspnea: Secondary | ICD-10-CM

## 2017-01-19 DIAGNOSIS — I421 Obstructive hypertrophic cardiomyopathy: Secondary | ICD-10-CM

## 2017-01-19 DIAGNOSIS — R002 Palpitations: Secondary | ICD-10-CM

## 2017-08-12 ENCOUNTER — Other Ambulatory Visit: Payer: Self-pay

## 2017-08-12 DIAGNOSIS — I421 Obstructive hypertrophic cardiomyopathy: Secondary | ICD-10-CM

## 2017-08-12 MED ORDER — AMLODIPINE BESYLATE 5 MG PO TABS: 5.0000 mg | ORAL_TABLET | Freq: Every day | ORAL | 2 refills | Status: DC

## 2017-11-29 ENCOUNTER — Telehealth: Payer: Self-pay | Admitting: Cardiology

## 2017-11-29 DIAGNOSIS — I421 Obstructive hypertrophic cardiomyopathy: Secondary | ICD-10-CM

## 2017-11-29 NOTE — Telephone Encounter (Signed)
New message  Pt c/o medication issue:  1. Name of Medication: amLODipine (NORVASC) 5 MG tablet(Expired)  2. How are you currently taking this medication (dosage and times per day)? Once daily in morning  3. Are you having a reaction (difficulty breathing--STAT)?no   4. What is your medication issue? Patient states that the medication has been recalled by FDA. Please advise if he can get a refill on this or if he needs to get another medication.

## 2017-11-29 NOTE — Telephone Encounter (Signed)
Would continue amlodipine for now Dale MillersBrian Ruth Kovich

## 2017-11-30 MED ORDER — AMLODIPINE BESYLATE 5 MG PO TABS
5.0000 mg | ORAL_TABLET | Freq: Every day | ORAL | 2 refills | Status: DC
Start: 2017-11-30 — End: 2018-09-20

## 2017-11-30 NOTE — Telephone Encounter (Signed)
Spoke with pt, Aware of dr crenshaw's recommendations. Refill sent to the pharmacy electronically.  

## 2018-01-21 ENCOUNTER — Other Ambulatory Visit: Payer: Self-pay | Admitting: Cardiology

## 2018-01-21 DIAGNOSIS — I421 Obstructive hypertrophic cardiomyopathy: Secondary | ICD-10-CM

## 2018-02-15 NOTE — Progress Notes (Signed)
HPI: FU hypertrophic cardiomyopathy. 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 I did have Dr. Graciela HusbandsKlein evaluate the patient. He felt these were most likely vagal in etiology. He recommended continued beta blocker and no ICD. Last echocardiogram October 2018 showed vigorous LV function, severe basal septal hypertrophy, mild left atrial and right atrial enlargement.  There was no LVOT obstruction.  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  Nuclear study showed ejection fraction 27% but felt to be error; normal perfusion; normal systolic blood pressure response to exercise.  Holter monitor October 2018 showed sinus with PACs, PVCs and 4 beats nonsustained ventricular tachycardia.  Since I last saw him,  he denies dyspnea, chest pain, syncope.  Occasional brief palpitations not sustained.  Current Outpatient Medications  Medication Sig Dispense Refill  . allopurinol (ZYLOPRIM) 100 MG tablet TAKE ONE TABLET TWICE DAILY    . amLODipine (NORVASC) 5 MG tablet Take 1 tablet (5 mg total) by mouth daily. 90 tablet 2  . aspirin 81 MG tablet Take 81 mg by mouth daily.      Marland Kitchen. atorvastatin (LIPITOR) 20 MG tablet Take 20 mg by mouth daily.      . metoprolol succinate (TOPROL-XL) 50 MG 24 hr tablet TAKE 1 TABLET BY MOUTH  DAILY WITH OR IMMEDIATLEY  FOLLOWING A MEAL 90 tablet 3  . Testosterone (ANDROGEL PUMP) 20.25 MG/ACT (1.62%) GEL Place onto the skin. 3 pumps per day     No current facility-administered medications for this visit.      Past Medical History:  Diagnosis Date  . Cardiomyopathy    hypertrophic  . HYPERLIPIDEMIA 07/02/2009   Qualifier: Diagnosis of  By: Jens Somrenshaw, MD, Lyn HollingsheadFACC, Chantry Saunders   . 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 Lower extremity edema 11/15/2013  . Palpitations 09/18/2009   Qualifier: Diagnosis of  By: Jens Somrenshaw, MD, Lyn HollingsheadFACC, [place] Saunders   . SYNCOPE 07/02/2009   Qualifier: Diagnosis of  By: Jens Somrenshaw, MD, Lyn HollingsheadFACC, Aqeel Saunders     Past Surgical History:  Procedure Laterality Date  . CERVICAL DISC SURGERY    . TONSILLECTOMY      Social History   Socioeconomic History  . Marital status: Single    Spouse name: Not on file  . Number of children: Not on file  . Years of education: Not on file  . Highest education level: Not on file  Occupational History  . Not on file  Social Needs  . Financial resource strain: Not on file  . Food insecurity:    Worry: Not on file    Inability: Not on file  . Transportation needs:    Medical: Not on file    Non-medical: Not on file  Tobacco Use  . Smoking status: Never Smoker  . Smokeless tobacco: Never Used  . Tobacco comment: tobacco use- no   Substance and Sexual Activity  . Alcohol use: Yes  . Drug use: Not on file  . Sexual activity: Not on file  Lifestyle  . Physical activity:    Days per week: Not on file    Minutes per session: Not on file  . Stress: Not on file  Relationships  . Social connections:    Talks on phone: Not on file    Gets together: Not on file    Attends religious service: Not on file  Active member of club or organization: Not on file    Attends meetings of clubs or organizations: Not on file    Relationship status: Not on file  . Intimate partner violence:    Fear of current or ex partner: Not on file    Emotionally abused: Not on file    Physically abused: Not on file    Forced sexual activity: Not on file  Other Topics Concern  . Not on file  Social History Narrative   Full time.     Family History  Problem Relation Age of Onset  . Heart failure Mother   . Coronary artery disease Other   . Diabetes Other        siblings    ROS: no fevers or chills, productive cough, hemoptysis, dysphasia, odynophagia, melena, hematochezia, dysuria, hematuria, rash, seizure  activity, orthopnea, PND, pedal edema, claudication. Remaining systems are negative.  Physical Exam: Well-developed well-nourished in no acute distress.  Skin is warm and dry.  HEENT is normal.  Neck is supple.  Chest is clear to auscultation with normal expansion.  Cardiovascular exam is regular rate and rhythm.  2/6 systolic murmur left sternal border. Abdominal exam nontender or distended. No masses palpated. Extremities show no edema. neuro grossly intact  ECG-normal sinus rhythm at a rate of 65.  Left ventricular hypertrophy with repolarization abnormality.  Personally reviewed  A/P  1 hypertrophic cardiomyopathy-patient is not having significant symptoms.  Continue beta-blocker at present dose.  Patient does not have a family history of sudden cardiac death.  Previous treadmill showed normal systolic blood pressure response to exercise.  He did have 4 beats of nonsustained ventricular tachycardia on Holter monitor but episode were not frequent.  Risk of sudden death decreases after age 42 and he is nearing that age.  I will arrange a cardiac MRI to assess LGE uptake.  His daughter is being screened.  I also discussed that his siblings need to be screened.  2 hypertension-patient's blood pressure is controlled.  Continue present medications and follow.  3 history of palpitations-no recent symptoms.  Continue beta-blocker.  4 hyperlipidemia-continue statin.  Olga Millers, MD

## 2018-02-23 ENCOUNTER — Encounter: Payer: Self-pay | Admitting: Cardiology

## 2018-02-23 ENCOUNTER — Ambulatory Visit (INDEPENDENT_AMBULATORY_CARE_PROVIDER_SITE_OTHER): Payer: 59 | Admitting: Cardiology

## 2018-02-23 VITALS — BP 128/74 | HR 65 | Ht 70.0 in | Wt 213.8 lb

## 2018-02-23 DIAGNOSIS — I10 Essential (primary) hypertension: Secondary | ICD-10-CM

## 2018-02-23 DIAGNOSIS — E7800 Pure hypercholesterolemia, unspecified: Secondary | ICD-10-CM

## 2018-02-23 DIAGNOSIS — I421 Obstructive hypertrophic cardiomyopathy: Secondary | ICD-10-CM

## 2018-02-23 NOTE — Patient Instructions (Signed)
Medication Instructions:   NO CHANGE  Testing/Procedures:  Your physician has requested that you have a cardiac MRI. Cardiac MRI uses a computer to create images of your heart as its beating, producing both still and moving pictures of your heart and major blood vessels. For further information please visit [email or web address]. Please follow the instruction sheet given to you today for more information.    Follow-Up:  Your physician recommends that you schedule a follow-up appointment in: 12 MONTHS WITH DR CRENSHAW PLEASE GIVE OUR OFFICE A CALL 2 MONTHS PRIOR TO THAT APPOINTMENT TIME TO SCHEDULE

## 2018-03-01 ENCOUNTER — Telehealth: Payer: Self-pay | Admitting: *Deleted

## 2018-03-01 ENCOUNTER — Encounter: Payer: Self-pay | Admitting: Cardiology

## 2018-03-01 NOTE — Telephone Encounter (Signed)
Left message regarding Cardiac MRI---03/14/18 at 12 noon.  Will also mail information to patient

## 2018-03-14 ENCOUNTER — Ambulatory Visit (HOSPITAL_COMMUNITY)
Admission: RE | Admit: 2018-03-14 | Discharge: 2018-03-14 | Disposition: A | Discharge status: Home / Self Care | Payer: 59 | Source: Ambulatory Visit | Attending: Cardiology | Admitting: Cardiology

## 2018-03-14 DIAGNOSIS — I421 Obstructive hypertrophic cardiomyopathy: Secondary | ICD-10-CM

## 2018-03-14 MED ORDER — GADOBUTROL 1 MMOL/ML IV SOLN
10.0000 mL | Freq: Once | INTRAVENOUS | Status: AC | PRN
  Administered 2018-03-14: 10 mL via INTRAVENOUS

## 2018-09-09 HISTORY — PX: COLONOSCOPY: SHX174

## 2018-09-09 LAB — HM COLONOSCOPY

## 2018-09-19 ENCOUNTER — Other Ambulatory Visit: Payer: Self-pay | Admitting: Cardiology

## 2018-09-19 DIAGNOSIS — I421 Obstructive hypertrophic cardiomyopathy: Secondary | ICD-10-CM

## 2019-02-04 ENCOUNTER — Other Ambulatory Visit: Payer: Self-pay | Admitting: Cardiology

## 2019-02-04 DIAGNOSIS — I421 Obstructive hypertrophic cardiomyopathy: Secondary | ICD-10-CM

## 2019-02-08 ENCOUNTER — Other Ambulatory Visit: Payer: Self-pay

## 2019-02-08 ENCOUNTER — Encounter: Payer: Self-pay | Admitting: Family

## 2019-02-08 ENCOUNTER — Ambulatory Visit (INDEPENDENT_AMBULATORY_CARE_PROVIDER_SITE_OTHER): Payer: 59 | Admitting: Family

## 2019-02-08 DIAGNOSIS — I421 Obstructive hypertrophic cardiomyopathy: Secondary | ICD-10-CM

## 2019-02-08 MED ORDER — AMLODIPINE BESYLATE 5 MG PO TABS: 5.0000 mg | ORAL_TABLET | Freq: Every day | ORAL | 1 refills | Status: DC

## 2019-02-08 MED ORDER — METOPROLOL SUCCINATE ER 50 MG PO TB24: 50.0000 mg | ORAL_TABLET | Freq: Every day | ORAL | 1 refills | Status: DC

## 2019-02-08 NOTE — Patient Instructions (Addendum)
Medication Instructions:  No medication change today.  *If you need a refill on your cardiac medications before your next appointment, please call your pharmacy*  Lab Work: No lab work today.  If you have labs (blood work) drawn today and your tests are completely normal, you will receive your results only by: Marland Kitchen MyChart Message (if you have MyChart) OR . A paper copy in the mail If you have any lab test that is abnormal or we need to change your treatment, we will call you to review the results.  Testing/Procedures: You had an EKG today.  Follow-Up: At [facility], you and your health needs are our priority.  As part of our continuing mission to provide you with exceptional heart care, we have created designated Provider Care Teams.  These Care Teams include your primary Cardiologist (physician) and Advanced Practice Providers (APPs -  Physician Assistants and Nurse Practitioners) who all work together to provide you with the care you need, when you need it.  Your next appointment:   6 months  The format for your next appointment:   In Person  Provider:   Olga Millers, MD  Other Instructions   Hypertrophic Cardiomyopathy  Hypertrophic cardiomyopathy (HCM) is a heart condition in which part of the heart muscle gets too thick. The condition can cause a dangerous and abnormal heart rhythm. It can also weaken the heart over time. The heart is divided into four chambers. The thickening usually affects the pumping chamber on the lower left (left ventricle). HCM may also affect the valve that lets blood flow into the left ventricle (mitral valve). Symptoms often begin when a person is about 60 years old. What are the causes? This condition is usually caused by abnormal genes that control heart muscle growth. These abnormal genes are passed down through families (are inherited). What increases the risk? You are more likely to develop this condition if you have a family history of  HCM. What are the signs or symptoms? Symptoms of this condition include:  Shortness of breath, especially after exercising or lying down.  Chest pain.  Dizziness.  Fatigue.  Irregular or fast heart rate.  Fainting, especially after physical activity. How is this diagnosed? This condition is diagnosed based on:  A physical exam that involves checking for an abnormal heart sound (heart murmur).  Tests, such as: ? An electrocardiogram (ECG). This is a test that records your heart's electrical activity. ? An echocardiogram. This test can show whether your left ventricle is enlarged and whether it fills slowly. ? An exercise stress test. This test is done to collect information about how your heart functions during exercise. ? A Doppler test. This is a test that shows irregular blood flow and pressure differences inside the heart. ? A chest X-ray to see whether your heart is enlarged. ? An MRI. ? Genetic testing done on a blood sample. How is this treated? Treatment for HCM depends on how severe your symptoms are. There are several options for treatment, including:  Medicine. Medicines can be given to: ? Reduce the workload of your heart. ? Lower your blood pressure. ? Thin your blood and prevent clots.  A device. Devices that can be used to treat this condition include: ? A pacemaker. This device helps to control your heartbeat. ? A defibrillator. This device restores a normal heart rhythm.  Surgery. This may include a procedure to: ? Inject alcohol into the small blood vessels that supply your heart muscle (alcohol septal ablation).This is  done during a procedure called cardiac catheterization. The goal is to cause the muscle to become thinner. ? Remove part of the wall that divides the right and left sides of the heart (septum) with a procedure called surgical myectomy. ? Replace the mitral valve. Follow these instructions at home: Activity  Avoid strenuous exercise and  activities, such as shoveling snow. Do not participate in high intensity activities, such as competitive sports.  Get regular physical activity. Most patients can participate in low to moderate intensity exercise such as walking. Ask your health care provider what activity level is safe for you.  Do not lift anything that is heavier than 10 lb (4.5 kg), or the limit that you are told, until your health care provider says that it is safe. Lifestyle  Maintain a healthy weight. If you need help with losing weight, ask your health care provider.  Eat a heart-healthy diet.  Do not drink alcohol if: ? Your health care provider tells you not to drink. ? You are pregnant, may be pregnant, or are planning to become pregnant.  If you drink alcohol: ? Limit how much you use to:  0-1 drink a day for women.  0-2 drinks a day for men. ? Be aware of how much alcohol is in your drink. In the [place] one drink equals one 12 oz bottle of beer (355 mL), one 5 oz glass of wine (148 mL), or one 1 oz glass of hard liquor (44 mL).  Do not use any products that contain nicotine or tobacco, such as cigarettes, e-cigarettes, and chewing tobacco. If you need help quitting, ask your health care provider. General instructions  Make sure the members of your household know how to do CPR in case of an emergency.  Take over-the-counter and prescription medicines only as told by your health care provider.  Keep all follow-up visits as told by your health care provider. This is important. Contact a health care provider if:  You have new symptoms.  Your symptoms get worse. Get help right away if:  You have chest pain or shortness of breath, especially during or after sports.  You feel faint or you pass out.  You have trouble breathing even at rest.  Your feet or ankles swell.  Your heartbeat seems irregular or seems faster than normal (palpitations). These symptoms may represent a serious problem that is  an emergency. Do not wait to see if the symptoms will go away. Get medical help right away. Call your local emergency services (911 in the [place] Do not drive yourself to the hospital. Summary  Hypertrophic cardiomyopathy (HCM) is a heart condition in which part of the heart muscle gets too thick.  The condition can cause a dangerous and abnormal heart rhythm, and it can weaken the heart over time.  Avoid strenuous exercise and activities, such as shoveling snow.  Make sure the members of your household know how to do CPR in case of an emergency.  Keep all follow-up visits as told by your health care provider. This is important. This information is not intended to replace advice given to you by your health care provider. Make sure you discuss any questions you have with your health care provider. Document Released: 02/27/2004 Document Revised: 12/14/2017 Document Reviewed: 12/14/2017 Elsevier Patient Education  2020 [employer].

## 2019-02-08 NOTE — Addendum Note (Signed)
Addended by: Dariona Postma S on: 02/08/2019 11:02 AM   Modules accepted: Orders  

## 2019-02-08 NOTE — Progress Notes (Signed)
Office Visit    Patient Name: Dale Rivera Date of Encounter: 02/08/2019  Primary Care Provider:  Loleta Dicker, FNP Primary Cardiologist:  Olga Millers, MD Electrophysiologist:  None   Chief Complaint    Dale Rivera is a 58 y.o. male with a hx of hypertrophic cardiomyopathy, HTN, HLD, palpitations presents today for 6 month f/u of hypertrophic cardiomyopathy.   Past Medical History    Past Medical History:  Diagnosis Date  . Cardiomyopathy    hypertrophic  . HYPERLIPIDEMIA 07/02/2009   Qualifier: Diagnosis of  By: Jens Som, MD, Lyn Hollingshead   . Hypertrophic obstructive cardiomyopathy (HCC) 07/03/2009   Qualifier: Diagnosis of  By: Jens Som, MD, Lyn Hollingshead   . Lower extremity edema 11/15/2013  . Palpitations 09/18/2009   Qualifier: Diagnosis of  By: Jens Som, MD, Lyn Hollingshead   . SYNCOPE 07/02/2009   Qualifier: Diagnosis of  By: Jens Som, MD, Lyn Hollingshead    Past Surgical History:  Procedure Laterality Date  . CERVICAL DISC SURGERY    . TONSILLECTOMY      Allergies  No Known Allergies  History of Present Illness    Dale Rivera is a 58 y.o. male with a hx of hypertrophic cardiomyopathy, HTN, HLD, palpitations last seen 02/23/2018 by Dr. Jens Som.  October 2018 echo vigorous LV function, severe basal septal hypertrophy, mild LA and RA enlargement, no LVOT obstruction. FElt to be consistent with mid cavitary variant hypertrophic cardiomyopathy without obstruction. Nuclear study EF 27%, but felt to be error; normal perfusion; normal systolic BP response to exercise. Holter monitor October 2018 SR with PAC, PVC, 4 beats nonsustained VT. He was previously evaluated by Dr. 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 No family history of sudden cardiac death.   Cardiac MRI 03-30-18 with mid LV variant of hypertrophic cardiomyopathy with asymmetric hypertrophy of basal septal segment and mid LV  segment. EF 64%. RV normal size and function. Some evidence of scarring via mid-wall LGE in the basal anteroseptum, mid anterior and lateral walls.  He reports feeling well. Continue to work for [employer]. Tell me his daughter is a Printmaker in nursing school at [employer]. She has been screened for hypertrophic cardiomyopathy with no concerning findings.   Tells me he has had trouble with exercise regimen due to gym closures. Encouraged to try to remain active by walking or exercising at home. Endorses eating a heart healthy diet.   He denies chest pain, pressure, tightness. No near-syncope nor syncope. No palpitations, edema, SOB, DOE.   EKGs/Labs/Other Studies Reviewed:   The following studies were reviewed today:  Cardiac MRI 03-30-2018 IMPRESSION: 1. This study is suggestive of a mid LV variant of hypertrophic cardiomyopathy with asymmetric hypertrophy of the basal septal segments and the mid LV segments. EF 64%. Suspect mid-cavity gradient.   2.  Normal RV size and systolic function.   3. Delayed enhancement imaging showed mid-wall LGE in the basal anteroseptum and the mid anterior and lateral walls. This is not a coronary disease pattern. Suspect this scarring is related to hypertrophic cardiomyopathy and suggests higher risk for VT.  EKG:  EKG is ordered today.  The ekg ordered today demonstrates R with LVH repolarization abnormality. Stable compared to previous.   Recent Labs: 09/13/18 via [facility] Everywhere:   Hb 16.6, creatinine0.92, GFR 90, AT 18, ALT 29 Recent Lipid Panel 09/01/18 via [facility] Everywhere:   Total cholesterol 119, triglycerides 63, HDL 45, LDL 61  Home Medications   Current Meds  Medication Sig  . allopurinol (ZYLOPRIM) 100 MG tablet TAKE ONE TABLET TWICE DAILY  . amLODipine (NORVASC) 5 MG tablet Take 1 tablet (5 mg total) by mouth daily.  Marland Kitchen aspirin 81 MG tablet Take 81 mg by mouth daily.    Marland Kitchen atorvastatin (LIPITOR) 20 MG tablet Take 20 mg by mouth daily.    Marland Kitchen  dicyclomine (BENTYL) 20 MG tablet Take 1 tablet by mouth 3 (three) times daily as needed.  . metoprolol succinate (TOPROL-XL) 50 MG 24 hr tablet Take 1 tablet (50 mg total) by mouth daily.  . Testosterone (ANDROGEL PUMP) 20.25 MG/ACT (1.62%) GEL Place onto the skin. 3 pumps per day  . [DISCONTINUED] amLODipine (NORVASC) 5 MG tablet TAKE 1 TABLET BY MOUTH  DAILY  . [DISCONTINUED] metoprolol succinate (TOPROL-XL) 50 MG 24 hr tablet Take 1 tablet (50 mg total) by mouth daily. Please make annual appt with Dr. Stanford Breed for future refills. [phone]. 1st attempt.    Review of Systems       Review of Systems  Constitution: Negative for chills, fever and malaise/fatigue.  Cardiovascular: Negative for chest pain, dyspnea on exertion, irregular heartbeat, leg swelling, near-syncope, orthopnea, palpitations and syncope.  Respiratory: Negative for cough, shortness of breath and wheezing.   Gastrointestinal: Negative for nausea and vomiting.  Neurological: Negative for dizziness, light-headedness and weakness.   All other systems reviewed and are otherwise negative except as noted above.  Physical Exam    VS:  BP 120/62   Pulse 66   Ht 5\' 10"  (1.778 m)   Wt 212 lb (96.2 kg)   SpO2 98%   BMI 30.42 kg/m  , BMI Body mass index is 30.42 kg/m. GEN: Well nourished, well developed, in no acute distress. HEENT: normal. Neck: Supple, no JVD, carotid bruits, or masses. Cardiac: RRR, no  rubs, or gallops. 2/6 systolic murmur LSB. No clubbing, cyanosis, edema.  Radials/DP/PT 2+ and equal bilaterally.  Respiratory:  Respirations regular and unlabored, clear to auscultation bilaterally. GI: Soft, nontender, nondistended, BS + x 4. MS: No deformity or atrophy. Skin: Warm and dry, no rash. Neuro:  Strength and sensation are intact. Psych: Normal affect.  Assessment & Plan    1. Hypertrophic cardiomyopathy - No significant symptoms: no chest pain, SOB, syncope, palpitations. Continue beta blocker and  CCB. Family members have been screened. Cardiac MRI 03/14/18 mild LV variant of hypertrophic cardiomyopathy with asymmetric hypertrophy of basal septal segment and mid LV egment.  2. HTN - BP well controlled. Continue present anti-hypertensive  Regimen.  3. HLD - Lipid profile 08/2018 at goal. Continue statin.  4. Hx of palpitations - Denies palpitations. Known PVC, PAC on previous ZIO. No indication for repeat monitoring.   Disposition: Follow up in 6 month(s) with Dr. Stanford Breed.   Loel Dubonnet, NP 02/08/2019, 10:02 AM

## 2019-06-29 ENCOUNTER — Ambulatory Visit: Payer: 59 | Attending: Internal Medicine

## 2019-06-29 DIAGNOSIS — Z23 Encounter for immunization: Secondary | ICD-10-CM

## 2019-06-29 NOTE — Progress Notes (Signed)
   Covid-19 Vaccination Clinic  Name:  Dale Rivera    MRN: [id number] DOB: 1961-01-21  06/29/2019  Dale Rivera was observed post Covid-19 immunization for 15 minutes without incident. He was provided with Vaccine Information Sheet and instruction to access the V-Safe system.   Dale Rivera was instructed to call 911 with any severe reactions post vaccine: Marland Kitchen Difficulty breathing  . Swelling of face and throat  . A fast heartbeat  . A bad rash all over body  . Dizziness and weakness   Immunizations Administered    Name Date Dose VIS Date Route   Pfizer COVID-19 Vaccine 06/29/2019  8:23 AM 0.3 mL 03/17/2019 Intramuscular   Manufacturer: [employer], [employer]   Lot: [id number]   NDC: [address]-4

## 2019-07-24 ENCOUNTER — Ambulatory Visit: Payer: 59 | Attending: Internal Medicine

## 2019-07-24 DIAGNOSIS — Z23 Encounter for immunization: Secondary | ICD-10-CM

## 2019-07-24 NOTE — Progress Notes (Signed)
   Covid-19 Vaccination Clinic  Name:  Dale Rivera    MRN: [id number] DOB: 1960-08-01  07/24/2019  Dale Rivera was observed post Covid-19 immunization for 15 minutes without incident. He was provided with Vaccine Information Sheet and instruction to access the V-Safe system.   Dale Rivera was instructed to call 911 with any severe reactions post vaccine: Marland Kitchen Difficulty breathing  . Swelling of face and throat  . A fast heartbeat  . A bad rash all over body  . Dizziness and weakness   Immunizations Administered    Name Date Dose VIS Date Route   Pfizer COVID-19 Vaccine 07/24/2019  8:14 AM 0.3 mL 05/31/2018 Intramuscular   Manufacturer: [employer], [employer]   Lot: [id number]   NDC: [address]-3

## 2019-10-15 ENCOUNTER — Other Ambulatory Visit: Payer: Self-pay | Admitting: Family

## 2019-10-15 DIAGNOSIS — I421 Obstructive hypertrophic cardiomyopathy: Secondary | ICD-10-CM

## 2019-12-09 ENCOUNTER — Other Ambulatory Visit: Payer: Self-pay | Admitting: Cardiology

## 2019-12-09 DIAGNOSIS — I421 Obstructive hypertrophic cardiomyopathy: Secondary | ICD-10-CM

## 2020-01-04 DIAGNOSIS — M109 Gout, unspecified: Secondary | ICD-10-CM | POA: Insufficient documentation

## 2020-01-18 ENCOUNTER — Other Ambulatory Visit: Payer: Self-pay | Admitting: Cardiology

## 2020-01-18 DIAGNOSIS — I421 Obstructive hypertrophic cardiomyopathy: Secondary | ICD-10-CM

## 2020-01-23 ENCOUNTER — Other Ambulatory Visit: Payer: Self-pay | Admitting: Cardiology

## 2020-01-23 DIAGNOSIS — I421 Obstructive hypertrophic cardiomyopathy: Secondary | ICD-10-CM

## 2020-01-23 MED ORDER — AMLODIPINE BESYLATE 5 MG PO TABS: 5.0000 mg | ORAL_TABLET | Freq: Every day | ORAL | 0 refills | Status: DC

## 2020-01-23 MED ORDER — METOPROLOL SUCCINATE ER 50 MG PO TB24: 50.0000 mg | ORAL_TABLET | Freq: Every day | ORAL | 0 refills | Status: DC

## 2020-03-06 DIAGNOSIS — K760 Fatty (change of) liver, not elsewhere classified: Secondary | ICD-10-CM | POA: Insufficient documentation

## 2020-03-18 ENCOUNTER — Other Ambulatory Visit: Payer: Self-pay | Admitting: Cardiology

## 2020-03-18 DIAGNOSIS — I421 Obstructive hypertrophic cardiomyopathy: Secondary | ICD-10-CM

## 2020-03-20 ENCOUNTER — Other Ambulatory Visit: Payer: Self-pay

## 2020-03-20 DIAGNOSIS — I421 Obstructive hypertrophic cardiomyopathy: Secondary | ICD-10-CM

## 2020-03-20 MED ORDER — AMLODIPINE BESYLATE 5 MG PO TABS: 5.0000 mg | ORAL_TABLET | Freq: Every day | ORAL | 0 refills | Status: DC

## 2020-03-20 MED ORDER — METOPROLOL SUCCINATE ER 50 MG PO TB24: 50.0000 mg | ORAL_TABLET | Freq: Every day | ORAL | 3 refills | Status: DC

## 2020-03-20 MED ORDER — METOPROLOL SUCCINATE ER 50 MG PO TB24: 50.0000 mg | ORAL_TABLET | Freq: Every day | ORAL | 0 refills | Status: DC

## 2020-03-20 MED ORDER — AMLODIPINE BESYLATE 5 MG PO TABS: 5.0000 mg | ORAL_TABLET | Freq: Every day | ORAL | 3 refills | Status: DC

## 2020-03-20 NOTE — Telephone Encounter (Signed)
This encounter was created in error - please disregard.

## 2020-03-20 NOTE — Addendum Note (Signed)
Addended by: Joselyn Arrow on: 03/20/2020 10:55 AM   Modules accepted: Orders

## 2020-04-12 NOTE — Progress Notes (Signed)
HPI: FU hypertrophic cardiomyopathy. 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 I did have Dr. Graciela Husbands evaluate the patient. He felt these were most likely vagal in etiology. He recommended continued beta blocker and no ICD. Last echocardiogram October 2018 showed vigorous LV function, severe basal septal hypertrophy, mild left atrial and right atrial enlargement.  There was no LVOT obstruction.  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  Nuclear study showed ejection fraction 27% but felt to be error; normal perfusion; normal systolic blood pressure response to exercise.  Holter monitor October 2018 showed sinus with PACs, PVCs and 4 beats nonsustained ventricular tachycardia. Cardiac MRI December 2019 suggestive of mid LV variant hypertrophic cardiomyopathy with asymmetric hypertrophy of the basal septal segment and mid LV segments. Ejection fraction 64%.  There was delayed enhancement involving the mid wall in the basal anteroseptum and mid anterior and lateral walls suggesting higher risk of VT.  Since I last saw him,Pt denies dyspnea, CP, palpitations or syncope.   Current Outpatient Medications  Medication Sig Dispense Refill  . allopurinol (ZYLOPRIM) 100 MG tablet TAKE ONE TABLET TWICE DAILY    . amLODipine (NORVASC) 5 MG tablet TAKE 1 TABLET (5 MG TOTAL) BY MOUTH DAILY. 30 tablet 0  . aspirin 81 MG tablet Take 81 mg by mouth daily.    Marland Kitchen atorvastatin (LIPITOR) 20 MG tablet Take 20 mg by mouth daily.    . metoprolol succinate (TOPROL-XL) 50 MG 24 hr tablet TAKE 1 TABLET BY MOUTH DAILY. TAKE WITH OR IMMEDIATELY FOLLOWING A MEAL. 30 tablet 0   No current facility-administered medications for this visit.     Past Medical History:  Diagnosis Date  . Cardiomyopathy    hypertrophic  . HYPERLIPIDEMIA 07/02/2009   Qualifier: Diagnosis of  By: Jens Som, MD, Lyn Hollingshead   .  Hypertrophic obstructive cardiomyopathy (HCC) 07/03/2009   Qualifier: Diagnosis of  By: Jens Som, MD, Lyn Hollingshead   . Lower extremity edema 11/15/2013  . Palpitations 09/18/2009   Qualifier: Diagnosis of  By: Jens Som, MD, Lyn Hollingshead   . SYNCOPE 07/02/2009   Qualifier: Diagnosis of  By: Jens Som, MD, Lyn Hollingshead     Past Surgical History:  Procedure Laterality Date  . CERVICAL DISC SURGERY    . TONSILLECTOMY      Social History   Socioeconomic History  . Marital status: Single    Spouse name: Not on file  . Number of children: Not on file  . Years of education: Not on file  . Highest education level: Not on file  Occupational History  . Not on file  Tobacco Use  . Smoking status: Never Smoker  . Smokeless tobacco: Never Used  . Tobacco comment: tobacco use- no   Substance and Sexual Activity  . Alcohol use: Yes  . Drug use: Not on file  . Sexual activity: Not on file  Other Topics Concern  . Not on file  Social History Narrative   Full time.    Social Determinants of Health   Financial Resource Strain: Not on file  Food Insecurity: Not on file  Transportation Needs: Not on file  Physical Activity: Not on file  Stress: Not on file  Social Connections: Not on file  Intimate Partner Violence: Not on file    Family History  Problem Relation Age of Onset  . Heart failure Mother   . Coronary artery  disease Other   . Diabetes Other        siblings    ROS: no fevers or chills, productive cough, hemoptysis, dysphasia, odynophagia, melena, hematochezia, dysuria, hematuria, rash, seizure activity, orthopnea, PND, pedal edema, claudication. Remaining systems are negative.  Physical Exam: Well-developed well-nourished in no acute distress.  Skin is warm and dry.  HEENT is normal.  Neck is supple.  Chest is clear to auscultation with normal expansion.  Cardiovascular exam is irregular Abdominal exam nontender or distended. No masses  palpated. Extremities show no edema. neuro grossly intact  ECG-atrial fibrillation at a rate of 67, left ventricular hypertrophy with repolarization abnormalities.  Personally reviewed  A/P  1 new-onset atrial fibrillation-patient has developed new onset atrial fibrillation.  He is completely asymptomatic.  We will continue metoprolol for rate control.  Check echocardiogram and TSH.  We will also check CBC and bmet.  Add apixaban 5 mg twice daily.  Discontinue aspirin.  We discussed rhythm control versus rate control.  I will see him back in 6 to 8 weeks and if atrial fibrillation persists we will likely proceed with elective cardioversion.  If he does not hold sinus rhythm and remains asymptomatic we will likely continue rate control.  1 hypertrophic cardiomyopathy-patient remains asymptomatic.  Continue beta-blocker.  Note he does not have a family history of sudden cardiac death and previous treadmill showed normal systolic blood pressure response to exercise.  However he was previously noted to have 4 beats of nonsustained ventricular tachycardia on Holter monitor and cardiac MRI suggestive of increased risk for ventricular tachycardia.  I will have Dr. Graciela Husbands review for consideration of ICD.  Repeat echocardiogram.  We also discussed possible genetic testing for his daughter and he will consider.  2 hypertension-patient's blood pressure is controlled.  Continue present medications and follow.  3 palpitations-patient denies recent symptoms.  Continue beta-blocker at present dose.  4 hyperlipidemia-continue statin.  Olga Millers, MD

## 2020-04-14 ENCOUNTER — Other Ambulatory Visit: Payer: Self-pay | Admitting: Cardiology

## 2020-04-14 DIAGNOSIS — I421 Obstructive hypertrophic cardiomyopathy: Secondary | ICD-10-CM

## 2020-04-24 ENCOUNTER — Encounter: Payer: Self-pay | Admitting: Cardiology

## 2020-04-24 ENCOUNTER — Ambulatory Visit (INDEPENDENT_AMBULATORY_CARE_PROVIDER_SITE_OTHER): Payer: No Typology Code available for payment source | Admitting: Cardiology

## 2020-04-24 ENCOUNTER — Other Ambulatory Visit: Payer: Self-pay

## 2020-04-24 VITALS — BP 118/82 | HR 66 | Ht 70.0 in | Wt 218.1 lb

## 2020-04-24 DIAGNOSIS — I421 Obstructive hypertrophic cardiomyopathy: Secondary | ICD-10-CM

## 2020-04-24 DIAGNOSIS — I4891 Unspecified atrial fibrillation: Secondary | ICD-10-CM | POA: Diagnosis not present

## 2020-04-24 DIAGNOSIS — Z79899 Other long term (current) drug therapy: Secondary | ICD-10-CM | POA: Diagnosis not present

## 2020-04-24 DIAGNOSIS — R5383 Other fatigue: Secondary | ICD-10-CM | POA: Diagnosis not present

## 2020-04-24 MED ORDER — APIXABAN 5 MG PO TABS: 5.0000 mg | ORAL_TABLET | Freq: Two times a day (BID) | ORAL | 3 refills | Status: DC

## 2020-04-24 NOTE — Patient Instructions (Addendum)
Medication Instructions:   STOP ASPIRIN   START Eliquis 5 mg 2 times a day  *If you need a refill on your cardiac medications before your next appointment, please call your pharmacy*  Lab Work: Your physician recommends that you return for lab work TODAY:   BMET  CBC  TSH If you have labs (blood work) drawn today and your tests are completely normal, you will receive your results only by: Marland Kitchen MyChart Message (if you have MyChart) OR . A paper copy in the mail If you have any lab test that is abnormal or we need to change your treatment, we will call you to review the results.  Testing/Procedures: Your physician has requested that you have an echocardiogram. Echocardiography is a painless test that uses sound waves to create images of your heart. It provides your doctor with information about the size and shape of your heart and how well your heart's chambers and valves are working. This procedure takes approximately one hour. There are no restrictions for this procedure.   Please schedule for 3-4 weeks   Follow-Up: At [facility], you and your health needs are our priority.  As part of our continuing mission to provide you with exceptional heart care, we have created designated Provider Care Teams.  These Care Teams include your primary Cardiologist (physician) and Advanced Practice Providers (APPs -  Physician Assistants and Nurse Practitioners) who all work together to provide you with the care you need, when you need it.  Your next appointment:   6 week(s)  The format for your next appointment:   In Person  Provider:   Olga Millers, MD  Other Instructions You have been referred to Cardiac Electrophysiology

## 2020-04-25 LAB — BASIC METABOLIC PANEL
BUN/Creatinine Ratio: 20 (ref 10–24)
BUN: 22 mg/dL (ref 8–27)
CO2: 21 mmol/L (ref 20–29)
Calcium: 10 mg/dL (ref 8.6–10.2)
Chloride: 101 mmol/L (ref 96–106)
Creatinine, Ser: 1.11 mg/dL (ref 0.76–1.27)
GFR calc Af Amer: 82 mL/min/{1.73_m2} (ref >59)
GFR calc non Af Amer: 71 mL/min/{1.73_m2} (ref >59)
Glucose: 92 mg/dL (ref 65–99)
Potassium: 4.6 mmol/L (ref 3.5–5.2)
Sodium: 139 mmol/L (ref 134–144)

## 2020-04-25 LAB — CBC
Hematocrit: 48.4 % (ref 37.5–51.0)
Hemoglobin: 16.8 g/dL (ref 13.0–17.7)
MCH: 31.8 pg (ref 26.6–33.0)
MCHC: 34.7 g/dL (ref 31.5–35.7)
MCV: 92 fL (ref 79–97)
Platelets: 280 10*3/uL (ref 150–450)
RBC: 5.29 x10E6/uL (ref 4.14–5.80)
RDW: 12.4 % (ref 11.6–15.4)
WBC: 8.2 10*3/uL (ref 3.4–10.8)

## 2020-04-25 LAB — TSH: TSH: 4 u[IU]/mL (ref 0.450–4.500)

## 2020-04-29 ENCOUNTER — Encounter: Payer: Self-pay | Admitting: Internal Medicine

## 2020-05-14 DIAGNOSIS — I421 Obstructive hypertrophic cardiomyopathy: Secondary | ICD-10-CM

## 2020-05-15 ENCOUNTER — Other Ambulatory Visit: Payer: Self-pay | Admitting: Cardiology

## 2020-05-15 DIAGNOSIS — I421 Obstructive hypertrophic cardiomyopathy: Secondary | ICD-10-CM

## 2020-05-15 MED ORDER — METOPROLOL SUCCINATE ER 50 MG PO TB24: ORAL_TABLET | ORAL | 3 refills | Status: DC

## 2020-05-15 MED ORDER — ATORVASTATIN CALCIUM 20 MG PO TABS: 20.0000 mg | ORAL_TABLET | Freq: Every day | ORAL | 3 refills | Status: DC

## 2020-05-20 ENCOUNTER — Other Ambulatory Visit: Payer: Self-pay

## 2020-05-20 ENCOUNTER — Ambulatory Visit (HOSPITAL_BASED_OUTPATIENT_CLINIC_OR_DEPARTMENT_OTHER)
Admission: RE | Admit: 2020-05-20 | Discharge: 2020-05-20 | Disposition: A | Discharge status: Home / Self Care | Payer: PRIVATE HEALTH INSURANCE | Source: Ambulatory Visit | Attending: Cardiology | Admitting: Cardiology

## 2020-05-20 DIAGNOSIS — I4891 Unspecified atrial fibrillation: Secondary | ICD-10-CM | POA: Diagnosis not present

## 2020-05-20 LAB — ECHOCARDIOGRAM COMPLETE
Area-P 1/2: 3.3 cm2
S' Lateral: 1.65 cm

## 2020-05-30 ENCOUNTER — Other Ambulatory Visit: Payer: Self-pay

## 2020-05-30 ENCOUNTER — Telehealth (INDEPENDENT_AMBULATORY_CARE_PROVIDER_SITE_OTHER): Payer: No Typology Code available for payment source | Admitting: Internal Medicine

## 2020-05-30 VITALS — Ht 70.0 in | Wt 215.0 lb

## 2020-05-30 DIAGNOSIS — R002 Palpitations: Secondary | ICD-10-CM

## 2020-05-30 DIAGNOSIS — I421 Obstructive hypertrophic cardiomyopathy: Secondary | ICD-10-CM | POA: Diagnosis not present

## 2020-05-30 NOTE — Patient Instructions (Signed)
Medication Instructions:  Your physician recommends that you continue on your current medications as directed. Please refer to the Current Medication list given to you today.  *If you need a refill on your cardiac medications before your next appointment, please call your pharmacy*   Lab Work: None ordered.  If you have labs (blood work) drawn today and your tests are completely normal, you will receive your results only by: Marland Kitchen MyChart Message (if you have MyChart) OR . A paper copy in the mail If you have any lab test that is abnormal or we need to change your treatment, we will call you to review the results.   Testing/Procedures: ** Purchase an Engineer, structural as discussed by Dr Graciela Husbands  ** Referral to Dr Sidney Ace for genetics testing   Follow-Up: At [facility], you and your health needs are our priority.  As part of our continuing mission to provide you with exceptional heart care, we have created designated Provider Care Teams.  These Care Teams include your primary Cardiologist (physician) and Advanced Practice Providers (APPs -  Physician Assistants and Nurse Practitioners) who all work together to provide you with the care you need, when you need it.  We recommend signing up for the patient portal called "MyChart".  Sign up information is provided on this After Visit Summary.  MyChart is used to connect with patients for Virtual Visits (Telemedicine).  Patients are able to view lab/test results, encounter notes, upcoming appointments, etc.  Non-urgent messages can be sent to your provider as well.   To learn more about what you can do with MyChart, go to [email or web address].    Your next appointment:   Keep appointment with Dr Jens Som 06/19/2020 at 8am

## 2020-05-30 NOTE — Progress Notes (Signed)
ELECTROPHYSIOLOGY CONSULT NOTE  Patient ID: Elvia CollumBrian Dwyer, MRN: [id number], DOB/AGE: 07/01/1960 60 y.o. Admit date: (Not on file) Date of Consult: 05/30/2020  Primary Physician: Loleta DickerJudge, Erin, FNP Primary Cardiologist: Victorio PalmBCr     Elvia CollumBrian Worthley is a 60 y.o. male who is being seen today for the evaluation of HCM at the request of BCr.    HPI Elvia CollumBrian Marolf is a 60 y.o. male referred for consideration of an ICD.  He was found to have hypertrophic cardiomyopathy about 40 years ago.  He was playing basketball in college, he would have recurrent episodes of immediate post exertion syncope during college basketball, none with effort and none since.  Has had no major limitations in exercise tolerance.  He does not run, he does lift weights.  He walks vigorously.  No subsequent syncope.  Cardiac evaluation is as outlined below.  A Holter monitor demonstrated only a single 4 beat run of nonsustained ventricular tachycardia  1/22 Found to be in AFib  Without symptoms Apixoban  Started and then spontaneously reverted; or so we thinks-pulse today on his own evaluation was irregular   Family history is notable for his sister having been identified with HCM.  4 other siblings were presumably negative.  He has a single daughter, named [place], hoping to be a nurse at [employer] [facility]  No cardio genetic evaluation  DATE TEST EF   10/18 Echo   65-70 % Basal hypertrophy Mid Cavitary obliteration  12/19 cMRI  64 % LGE extensive Basal/mid-ant/anterolateral/inferolateral   2/22 Echo  55-60% 23mm septum  LVOT gradient 32   Date Cr K TSH Hgb  1/22 1.11 4.6 4.0 16.8             Past Medical History:  Diagnosis Date  . Cardiomyopathy    hypertrophic  . HYPERLIPIDEMIA 07/02/2009   Qualifier: Diagnosis of  By: Jens Somrenshaw, MD, Lyn HollingsheadFACC, Joie Saunders   . Hypertrophic obstructive cardiomyopathy (HCC) 07/03/2009   Qualifier: Diagnosis of  By: Jens Somrenshaw, MD, Lyn HollingsheadFACC, Dakwon Saunders   . Lower extremity edema 11/15/2013  .  Palpitations 09/18/2009   Qualifier: Diagnosis of  By: Jens Somrenshaw, MD, Lyn HollingsheadFACC, Ellias Saunders   . SYNCOPE 07/02/2009   Qualifier: Diagnosis of  By: Jens Somrenshaw, MD, Lyn HollingsheadFACC, Jaysten Saunders       Surgical History:  Past Surgical History:  Procedure Laterality Date  . CERVICAL DISC SURGERY    . TONSILLECTOMY       Home Meds: Current Meds  Medication Sig  . allopurinol (ZYLOPRIM) 100 MG tablet TAKE ONE TABLET TWICE DAILY  . amLODipine (NORVASC) 5 MG tablet TAKE 1 TABLET (5 MG TOTAL) BY MOUTH DAILY.  Marland Kitchen. apixaban (ELIQUIS) 5 MG TABS tablet Take 1 tablet (5 mg total) by mouth 2 (two) times daily.  Marland Kitchen. atorvastatin (LIPITOR) 20 MG tablet Take 1 tablet (20 mg total) by mouth daily.  . metoprolol succinate (TOPROL-XL) 50 MG 24 hr tablet TAKE 1 TABLET BY MOUTH DAILY. TAKE WITH OR IMMEDIATELY FOLLOWING A MEAL.    Allergies: No Known Allergies  Social History   Socioeconomic History  . Marital status: Single    Spouse name: Not on file  . Number of children: Not on file  . Years of education: Not on file  . Highest education level: Not on file  Occupational History  . Not on file  Tobacco Use  . Smoking status: Never Smoker  . Smokeless tobacco: Never Used  . Tobacco comment: tobacco use- no   Substance and Sexual Activity  .  Alcohol use: Yes  . Drug use: Not on file  . Sexual activity: Not on file  Other Topics Concern  . Not on file  Social History Narrative   Full time.    Social Determinants of Health   Financial Resource Strain: Not on file  Food Insecurity: Not on file  Transportation Needs: Not on file  Physical Activity: Not on file  Stress: Not on file  Social Connections: Not on file  Intimate Partner Violence: Not on file     Family History  Problem Relation Age of Onset  . Heart failure Mother   . Coronary artery disease Other   . Diabetes Other        siblings     ROS:  Please see the history of present illness.     All other systems reviewed and negative.     Physical Exam  Height 5\' 10"  (1.778 m), weight 215 lb (97.5 kg). General: Well developed, well nourished male in no acute distress. Head: Normocephalic, atraumatic, sclera non-icteric, no xanthomas, nares are without discharge. EENT: normal  Lymph Nodes:   Skin: COLOR normal  Neuro: Alert and oriented X 3  Grossly normal sensory and motor function . Psych:  Responds to questions appropriately with a normal affect.      Labs: Cardiac Enzymes No results for input(s): CKTOTAL, CKMB, TROPONINI in the last 72 hours. CBC Lab Results  Component Value Date   WBC 8.2 04/24/2020   HGB 16.8 04/24/2020   HCT 48.4 04/24/2020   MCV 92 04/24/2020   PLT 280 04/24/2020   PROTIME: No results for input(s): LABPROT, INR in the last 72 hours. Chemistry No results for input(s): NA, K, CL, CO2, BUN, CREATININE, CALCIUM, PROT, BILITOT, ALKPHOS, ALT, AST, GLUCOSE in the last 168 hours.  Invalid input(s): LABALBU Lipids No results found for: CHOL, HDL, LDLCALC, TRIG BNP No results found for: PROBNP Thyroid Function Tests: No results for input(s): TSH, T4TOTAL, T3FREE, THYROIDAB in the last 72 hours.  Invalid input(s): FREET3 Miscellaneous No results found for: DDIMER  Radiology/Studies:  ECHOCARDIOGRAM COMPLETE  Result Date: 05/20/2020    ECHOCARDIOGRAM REPORT   Patient Name:   BOSS DANIELSEN Date of Exam: 05/20/2020 Medical Rec #:  05/22/2020    Height:       70.0 in Accession #:    [id number]   Weight:       218.1 lb Date of Birth:  1960-08-12    BSA:          2.166 m Patient Age:    59 years     BP:           118/82 mmHg Patient Gender: M            HR:           71 bpm. Exam Location:  [place] [facility] Procedure: 2D Echo, Cardiac Doppler and Color Doppler Indications:    I48.91* Unspeicified atrial fibrillation; I42.2 HOCM  History:        Patient has prior history of Echocardiogram examinations, most                 recent 01/15/2017. Hypertrophic Cardiomyopathy,                 Arrythmias:Atrial  Fibrillation, Signs/Symptoms:Syncope; Risk                 Factors:Dyslipidemia.  Sonographer:    03/17/2017 Referring Phys: [id number] Miranda S CRENSHAW IMPRESSIONS  1. Left ventricular ejection  fraction, by estimation, is 55 to 60%. The left ventricle has normal function. The left ventricle has no regional wall motion abnormalities. There is severe concentric left ventricular hypertrophy. Increased LVOT gradient 32  mmHG with valsalva. Intracavitary obliteration is noted. No systolic anterior motion. Left ventricular diastolic parameters are indeterminate.  2. Right ventricular systolic function is normal. The right ventricular size is normal.  3. Left atrial size was mildly dilated.  4. The mitral valve is normal in structure. No evidence of mitral valve regurgitation. No evidence of mitral stenosis.  5. The aortic valve is normal in structure. Aortic valve regurgitation is not visualized. No aortic stenosis is present.  6. Unable to determine pulmonary artery systolic pressure, No TR jet spectral display. FINDINGS  Left Ventricle: Left ventricular ejection fraction, by estimation, is 55 to 60%. The left ventricle has normal function. The left ventricle has no regional wall motion abnormalities. The left ventricular internal cavity size was small. There is severe concentric left ventricular hypertrophy. Left ventricular diastolic parameters are indeterminate. Right Ventricle: The right ventricular size is normal. No increase in right ventricular wall thickness. Right ventricular systolic function is normal. Left Atrium: Left atrial size was mildly dilated. Right Atrium: Right atrial size was normal in size. Pericardium: There is no evidence of pericardial effusion. Presence of pericardial fat pad. Mitral Valve: The mitral valve is normal in structure. No evidence of mitral valve regurgitation. No evidence of mitral valve stenosis. Tricuspid Valve: The tricuspid valve is normal in structure. Tricuspid valve regurgitation  is not demonstrated. No evidence of tricuspid stenosis. Aortic Valve: The aortic valve is normal in structure. Aortic valve regurgitation is not visualized. No aortic stenosis is present. Pulmonic Valve: The pulmonic valve was normal in structure. Pulmonic valve regurgitation is not visualized. No evidence of pulmonic stenosis. Aorta: The aortic root is normal in size and structure. Venous: The inferior vena cava is normal in size with greater than 50% respiratory variability, suggesting right atrial pressure of 3 mmHg. IAS/Shunts: No atrial level shunt detected by color flow Doppler.  LEFT VENTRICLE PLAX 2D LVIDd:         2.53 cm  Diastology LVIDs:         1.65 cm  LV e' medial:    3.48 cm/s LV PW:         2.30 cm  LV E/e' medial:  18.7 LV IVS:        2.30 cm  LV e' lateral:   9.46 cm/s LVOT diam:     2.10 cm  LV E/e' lateral: 6.9 LV SV:         59 LV SV Index:   27 LVOT Area:     3.46 cm                          3D Volume EF:                         3D EF:        63 %                         LV EDV:       205 ml                         LV ESV:       75 ml  LV SV:        130 ml RIGHT VENTRICLE RV S prime:     10.80 cm/s TAPSE (M-mode): 0.9 cm LEFT ATRIUM             Index       RIGHT ATRIUM          Index LA diam:        4.00 cm 1.85 cm/m  RA Area:     8.72 cm LA Vol (A2C):   78.1 ml 36.06 ml/m RA Volume:   14.10 ml 6.51 ml/m LA Vol (A4C):   77.8 ml 35.92 ml/m LA Biplane Vol: 83.6 ml 38.60 ml/m  AORTIC VALVE LVOT Vmax:   88.10 cm/s LVOT Vmean:  59.400 cm/s LVOT VTI:    0.171 m  AORTA Ao Root diam: 2.70 cm MITRAL VALVE MV Area (PHT): 3.30 cm    SHUNTS MV Decel Time: 230 msec    Systemic VTI:  0.17 m MV E velocity: 65.10 cm/s  Systemic Diam: 2.10 cm MV A velocity: 32.60 cm/s MV E/A ratio:  2.00 Kardie Tobb DO Electronically signed by Thomasene Ripple DO Signature Date/Time: 05/20/2020/9:50:33 PM    Final     EKG: 1/22 atrial fibrillation with controlled ventricular response and prominent  inverted T waves in the anterolateral precordium Assessment and Plan:  Hypertrophic Cardiomyopathy  Atrial fibrillation   The patient has hypertrophic cardiomyopathy with newly identified atrial fibrillation and a surprisingly almost normal left atrial size.  He is on the borderline for age for risk stratification.  Probably reasonably apply to people under 65 especially given his modest degree of left ventricular dysfunction with his most recent ejection fraction in the 55% range, recalling that under 50% is considered severe dysfunction in the hypertrophic patient population.  MRI from 2018 describes significant segmental gadolinium enhancement.  I have asked for our colleagues to review his MRI to see if we can get a quantitation even qualitatively as less than 10-15 % is a important cutoff for identifying patients at low risk for cardiac arrest.  If his LGE is high and his EF being borderline I would favor further discussions regarding ICD implantation.  The patient is also so inclined. Also hope to assess by MR review the degree of left ventricular hypertrophy.  His echocardiogram 2018 described 14 mm walls, 2022 there were 23 mm and the MRI does not specify.  There may be a role for repeat MRI  By carotid palpation today he thinks his heart rhythm is still irregular.  I have advised him to get an AliveCor monitor so that we can determine from home whether he is in A. fib or not in the event that he is in atrial fibrillation I would plan A. fib clinic to facilitate cardioversion in anticipation of his visit to see Dr. Jens Som in about 3 more weeks.  Recommended that he get  AliveCor monitor 6L; this comes with a 19-month subscription for rhythm interpretation  Also recommended that he consult with Dr. Jomarie Longs to better understand the role of genetic testing.  We discussed the importance of gene probing as his remaining 4 siblings have a 50% likelihood of being gene carriers.  There may be  genetic/phenotypic discordance.  The 2 of them can also further discuss issues of insurance as relates to the family members.     Sherryl Manges

## 2020-05-30 NOTE — Addendum Note (Signed)
Addended by: Alois Cliche on: 05/30/2020 07:26 PM   Modules accepted: Orders

## 2020-06-03 ENCOUNTER — Other Ambulatory Visit: Payer: Self-pay | Admitting: *Deleted

## 2020-06-03 DIAGNOSIS — I421 Obstructive hypertrophic cardiomyopathy: Secondary | ICD-10-CM

## 2020-06-03 MED ORDER — AMLODIPINE BESYLATE 5 MG PO TABS: 5.0000 mg | ORAL_TABLET | Freq: Every day | ORAL | 3 refills | Status: DC

## 2020-06-06 NOTE — Progress Notes (Signed)
HPI: FU hypertrophic cardiomyopathy. 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 I did have Dr. Graciela Husbands evaluate the patient. He felt these were most likely vagal in etiology. He recommended continued beta blocker and no ICD.Nuclear study showed ejection fraction 27% but felt to be error; normal perfusion;normal systolic blood pressure response to exercise. Holter monitor October 2018 showed sinus with PACs, PVCs and 4 beats nonsustained ventricular tachycardia. Cardiac MRI December 2019 suggestive of mid LV variant hypertrophic cardiomyopathy with asymmetric hypertrophy of the basal septal segment and mid LV segments. Ejection fraction 64%.  There was delayed enhancement involving the mid wall in the basal anteroseptum and mid anterior and lateral walls suggesting higher risk of VT. Echo repeated 2/22 showed normal LV function, severe LVH, LVOT gradient of 32 mmHg with valsalva, mild LAE. Also with atrial fibrillation. Seen by SK and evaluation for ICD in progress. Since I last saw him,the patient denies any dyspnea on exertion, orthopnea, PND, pedal edema, palpitations, syncope or chest pain.   Current Outpatient Medications  Medication Sig Dispense Refill  . allopurinol (ZYLOPRIM) 100 MG tablet TAKE ONE TABLET TWICE DAILY    . amLODipine (NORVASC) 5 MG tablet TAKE 1 TABLET (5 MG TOTAL) BY MOUTH DAILY. 30 tablet 11  . apixaban (ELIQUIS) 5 MG TABS tablet Take 1 tablet (5 mg total) by mouth 2 (two) times daily. 180 tablet 3  . atorvastatin (LIPITOR) 20 MG tablet Take 1 tablet (20 mg total) by mouth daily. 90 tablet 3  . metoprolol succinate (TOPROL-XL) 50 MG 24 hr tablet TAKE 1 TABLET BY MOUTH DAILY. TAKE WITH OR IMMEDIATELY FOLLOWING A MEAL. 90 tablet 3   No current facility-administered medications for this visit.     Past Medical History:  Diagnosis Date  . Cardiomyopathy    hypertrophic  . HYPERLIPIDEMIA 07/02/2009   Qualifier:  Diagnosis of  By: Jens Som, MD, Lyn Hollingshead   . Hypertrophic obstructive cardiomyopathy (HCC) 07/03/2009   Qualifier: Diagnosis of  By: Jens Som, MD, Lyn Hollingshead   . Lower extremity edema 11/15/2013  . Palpitations 09/18/2009   Qualifier: Diagnosis of  By: Jens Som, MD, Lyn Hollingshead   . SYNCOPE 07/02/2009   Qualifier: Diagnosis of  By: Jens Som, MD, Lyn Hollingshead     Past Surgical History:  Procedure Laterality Date  . CERVICAL DISC SURGERY    . TONSILLECTOMY      Social History   Socioeconomic History  . Marital status: Single    Spouse name: Not on file  . Number of children: Not on file  . Years of education: Not on file  . Highest education level: Not on file  Occupational History  . Not on file  Tobacco Use  . Smoking status: Never Smoker  . Smokeless tobacco: Never Used  . Tobacco comment: tobacco use- no   Substance and Sexual Activity  . Alcohol use: Yes  . Drug use: Not on file  . Sexual activity: Not on file  Other Topics Concern  . Not on file  Social History Narrative   Full time.    Social Determinants of Health   Financial Resource Strain: Not on file  Food Insecurity: Not on file  Transportation Needs: Not on file  Physical Activity: Not on file  Stress: Not on file  Social Connections: Not on file  Intimate Partner Violence: Not on file    Family History  Problem Relation Age of Onset  .  Heart failure Mother   . Coronary artery disease Other   . Diabetes Other        siblings    ROS: no fevers or chills, productive cough, hemoptysis, dysphasia, odynophagia, melena, hematochezia, dysuria, hematuria, rash, seizure activity, orthopnea, PND, pedal edema, claudication. Remaining systems are negative.  Physical Exam: Well-developed well-nourished in no acute distress.  Skin is warm and dry.  HEENT is normal.  Neck is supple.  Chest is clear to auscultation with normal expansion.  Cardiovascular exam is regular  rate and rhythm.  Abdominal exam nontender or distended. No masses palpated. Extremities show no edema. neuro grossly intact  ECG-normal sinus rhythm at a rate of 71, left ventricular hypertrophy with repolarization abnormality.  Personally reviewed  A/P  1 paroxysmal atrial fibrillation-patient is back in sinus rhythm.  Will increase metoprolol for rate control if atrial fibrillation recurs (75 mg daily).  Continue apixaban.  2 hypertrophic cardiomyopathy-patient remains asymptomatic today. Continue beta-blocker but increase dose as outlined above.. Is presently being evaluated by Dr. Graciela Husbands for potential ICD. He is also considering genetic testing.  3 hypertension-blood pressure controlled. Continue present medical regimen.  4 hyperlipidemia-continue statin.  5 history of palpitations-continue beta-blocker.  Olga Millers, MD

## 2020-06-16 ENCOUNTER — Other Ambulatory Visit: Payer: Self-pay | Admitting: Cardiology

## 2020-06-16 DIAGNOSIS — I421 Obstructive hypertrophic cardiomyopathy: Secondary | ICD-10-CM

## 2020-06-19 ENCOUNTER — Encounter: Payer: Self-pay | Admitting: General Practice

## 2020-06-19 ENCOUNTER — Ambulatory Visit: Payer: PRIVATE HEALTH INSURANCE | Admitting: Cardiology

## 2020-06-19 ENCOUNTER — Encounter: Payer: Self-pay | Admitting: Cardiology

## 2020-06-19 ENCOUNTER — Other Ambulatory Visit: Payer: Self-pay

## 2020-06-19 VITALS — BP 132/76 | HR 71 | Ht 70.0 in | Wt 222.0 lb

## 2020-06-19 DIAGNOSIS — R002 Palpitations: Secondary | ICD-10-CM | POA: Diagnosis not present

## 2020-06-19 DIAGNOSIS — E7800 Pure hypercholesterolemia, unspecified: Secondary | ICD-10-CM | POA: Diagnosis not present

## 2020-06-19 DIAGNOSIS — I421 Obstructive hypertrophic cardiomyopathy: Secondary | ICD-10-CM

## 2020-06-19 DIAGNOSIS — I4891 Unspecified atrial fibrillation: Secondary | ICD-10-CM | POA: Diagnosis not present

## 2020-06-19 MED ORDER — METOPROLOL SUCCINATE ER 25 MG PO TB24: 75.0000 mg | ORAL_TABLET | Freq: Every day | ORAL | 3 refills | Status: DC

## 2020-06-19 NOTE — Patient Instructions (Signed)
Medication Instructions:   INCREASE METOPROLOL TO 75 MG ONCE DAILY= 3 OF THE 25 MG TABLETS ONCE DAILY  *If you need a refill on your cardiac medications before your next appointment, please call your pharmacy*   Follow-Up: At [facility], you and your health needs are our priority.  As part of our continuing mission to provide you with exceptional heart care, we have created designated Provider Care Teams.  These Care Teams include your primary Cardiologist (physician) and Advanced Practice Providers (APPs -  Physician Assistants and Nurse Practitioners) who all work together to provide you with the care you need, when you need it.  We recommend signing up for the patient portal called "MyChart".  Sign up information is provided on this After Visit Summary.  MyChart is used to connect with patients for Virtual Visits (Telemedicine).  Patients are able to view lab/test results, encounter notes, upcoming appointments, etc.  Non-urgent messages can be sent to your provider as well.   To learn more about what you can do with MyChart, go to [email or web address].    Your next appointment:   6 month(s)  The format for your next appointment:   In Person  Provider:   Olga Millers, MD

## 2020-06-20 NOTE — Telephone Encounter (Signed)
Pt was seen by Dr Jens Som 06/19/2020 and advised of - "paroxysmal atrial fibrillation-patient is back in sinus rhythm.  Will increase metoprolol for rate control if atrial fibrillation recurs (75 mg daily).  Continue apixaban."

## 2020-09-26 ENCOUNTER — Ambulatory Visit (INDEPENDENT_AMBULATORY_CARE_PROVIDER_SITE_OTHER): Payer: PRIVATE HEALTH INSURANCE

## 2020-09-26 ENCOUNTER — Ambulatory Visit (INDEPENDENT_AMBULATORY_CARE_PROVIDER_SITE_OTHER): Payer: PRIVATE HEALTH INSURANCE | Admitting: Medical-Surgical

## 2020-09-26 ENCOUNTER — Encounter: Payer: Self-pay | Admitting: Medical-Surgical

## 2020-09-26 ENCOUNTER — Other Ambulatory Visit: Payer: Self-pay

## 2020-09-26 VITALS — BP 125/81 | HR 72 | Temp 98.0°F | Ht 69.0 in | Wt 221.5 lb

## 2020-09-26 DIAGNOSIS — F419 Anxiety disorder, unspecified: Secondary | ICD-10-CM

## 2020-09-26 DIAGNOSIS — Z1329 Encounter for screening for other suspected endocrine disorder: Secondary | ICD-10-CM

## 2020-09-26 DIAGNOSIS — Z7689 Persons encountering health services in other specified circumstances: Secondary | ICD-10-CM | POA: Diagnosis not present

## 2020-09-26 DIAGNOSIS — E349 Endocrine disorder, unspecified: Secondary | ICD-10-CM | POA: Insufficient documentation

## 2020-09-26 DIAGNOSIS — Z114 Encounter for screening for human immunodeficiency virus [HIV]: Secondary | ICD-10-CM

## 2020-09-26 DIAGNOSIS — Z0000 Encounter for general adult medical examination without abnormal findings: Secondary | ICD-10-CM

## 2020-09-26 DIAGNOSIS — K5790 Diverticulosis of intestine, part unspecified, without perforation or abscess without bleeding: Secondary | ICD-10-CM

## 2020-09-26 DIAGNOSIS — I4891 Unspecified atrial fibrillation: Secondary | ICD-10-CM

## 2020-09-26 DIAGNOSIS — M1A9XX0 Chronic gout, unspecified, without tophus (tophi): Secondary | ICD-10-CM

## 2020-09-26 DIAGNOSIS — M79642 Pain in left hand: Secondary | ICD-10-CM | POA: Diagnosis not present

## 2020-09-26 DIAGNOSIS — I480 Paroxysmal atrial fibrillation: Secondary | ICD-10-CM | POA: Insufficient documentation

## 2020-09-26 DIAGNOSIS — Z1159 Encounter for screening for other viral diseases: Secondary | ICD-10-CM | POA: Diagnosis not present

## 2020-09-26 DIAGNOSIS — G479 Sleep disorder, unspecified: Secondary | ICD-10-CM

## 2020-09-26 DIAGNOSIS — E291 Testicular hypofunction: Secondary | ICD-10-CM

## 2020-09-26 MED ORDER — BUSPIRONE HCL 5 MG PO TABS: 5.0000 mg | ORAL_TABLET | Freq: Two times a day (BID) | ORAL | 0 refills | Status: AC | PRN

## 2020-09-26 MED ORDER — ALLOPURINOL 100 MG PO TABS: 100.0000 mg | ORAL_TABLET | Freq: Two times a day (BID) | ORAL | 3 refills | Status: DC

## 2020-09-26 NOTE — Progress Notes (Signed)
New Patient Office Visit  Subjective:  Patient ID: Dale Rivera, male    DOB: 1960/10/05  Age: 60 y.o. MRN: [id number]  CC:  Chief Complaint  Patient presents with   Establish Care    HPI Dale Rivera presents to establish care.  He is a pleasant 60 year old male who is followed by Dr. Jens Som for cardiomyopathy.  He has most of his prescription medications filled by Dr. Jens Som but notes that his allopurinol is managed by his PCP.  Gout-history of gout affecting the right first MTP joint.  Has not had any flares in at least 6 months or more.  Taking allopurinol 100 mg twice daily, tolerating well without side effects.  History of diverticulosis with recent episode of diverticulitis.  This was managed by oral antibiotics but he notes he had to have 2 separate prescriptions for it.  He does continue to have bowel issues and has a notable pattern of a couple of good days and then a day of diarrhea.  Has difficulty with excessive bloating as well as flatulence.  Notes his symptoms are worse when he eats drinks beer or has anything that contains lactose.  Previously followed by GI and was instructed at the last visit to start a probiotic.  Unfortunately he did not do this so is not sure if this has been helpful or not.  Trouble sleeping-he notes that he falls asleep without difficulty but does have some trouble with sleep maintenance.  Attributes some of this to anxiety related to his job but notes that his cardiologist mentioned a field is sometimes connected to sleep apnea.  He knows that he snores at times, especially when he is significantly exhausted but is not sure about other times.  He has never awakened himself snoring and has never been told he stops breathing in his sleep.  He does take antihypertensive medications but has never had significant hypertension.  Is interested in having a sleep study if at all possible.  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  Notes that this is  often anxiety provoking.  Most notably has anxiety prior to meetings with higher ups.  Is interested in the medication that he can take on an as-needed basis to help with anxiety so he does not struggle so much with presentations.  Left hand pain-reports an old softball injury many years ago where he had one of his left hand metacarpals and 3-4 places.  Over the years has had issues where pain recurs and notes this has most recently happened about 2 months ago.  He was playing golf and noted that his left hand started to hurt.  He is afraid he is reinjured or refractured the area and would like to look into that today.   Past Medical History:  Diagnosis Date   Atrial fibrillation (HCC)    Cardiomyopathy    hypertrophic   Gout    HYPERLIPIDEMIA 07/02/2009   Qualifier: Diagnosis of  By: Jens Som, MD, Lyn Hollingshead    Hypertension    Hypertrophic obstructive cardiomyopathy (HCC) 07/03/2009   Qualifier: Diagnosis of  By: Jens Som, MD, Lyn Hollingshead    Lower extremity edema 11/15/2013   Palpitations 09/18/2009   Qualifier: Diagnosis of  By: Jens Som, MD, Lyn Hollingshead    SYNCOPE 07/02/2009   Qualifier: Diagnosis of  By: Jens Som, MD, Lyn Hollingshead     Past Surgical History:  Procedure Laterality Date   CERVICAL DISC SURGERY     TONSILLECTOMY  Family History  Problem Relation Age of Onset   Hypertension Mother    Heart failure Mother    Heart attack Father    Hypertension Sister    Coronary artery disease Other    Diabetes Other        siblings    Social History   Socioeconomic History   Marital status: Married    Spouse name: Not on file   Number of children: Not on file   Years of education: Not on file   Highest education level: Not on file  Occupational History   Not on file  Tobacco Use   Smoking status: Never   Smokeless tobacco: Never   Tobacco comments:    tobacco use- no   Vaping Use   Vaping Use: Never used  Substance and  Sexual Activity   Alcohol use: Yes    Alcohol/week: 2.0 standard drinks    Types: 2 Standard drinks or equivalent per week   Drug use: Never   Sexual activity: Yes  Other Topics Concern   Not on file  Social History Narrative   Full time.    Social Determinants of Health   Financial Resource Strain: Not on file  Food Insecurity: Not on file  Transportation Needs: Not on file  Physical Activity: Not on file  Stress: Not on file  Social Connections: Not on file  Intimate Partner Violence: Not on file    ROS Review of Systems  Constitutional:  Negative for chills, fatigue, fever and unexpected weight change.  Respiratory:  Negative for cough, chest tightness, shortness of breath and wheezing.   Cardiovascular:  Negative for chest pain, palpitations and leg swelling.  Gastrointestinal:  Positive for abdominal pain, constipation and diarrhea. Negative for nausea and vomiting.  Genitourinary:  Negative for dysuria, frequency, hematuria and urgency.  Neurological:  Negative for dizziness, light-headedness and headaches.  Psychiatric/Behavioral:  Positive for sleep disturbance. Negative for dysphoric mood, self-injury and suicidal ideas. The patient is nervous/anxious.    Objective:   Today's Vitals: BP 125/81   Pulse 72   Temp 98 F (36.7 C)   Ht 5\' 9"  (1.753 m)   Wt 221 lb 8 oz (100.5 kg)   SpO2 94%   BMI 32.71 kg/m   Physical Exam Vitals and nursing note reviewed.  Constitutional:      General: He is not in acute distress.    Appearance: Normal appearance. He is not ill-appearing.  HENT:     Head: Normocephalic and atraumatic.  Cardiovascular:     Rate and Rhythm: Normal rate and regular rhythm.     Pulses: Normal pulses.     Heart sounds: Normal heart sounds. No murmur heard.   No friction rub. No gallop.  Pulmonary:     Effort: Pulmonary effort is normal. No respiratory distress.     Breath sounds: Normal breath sounds.  Abdominal:     General: Bowel sounds  are normal. There is no distension.     Palpations: Abdomen is soft. There is no mass.     Tenderness: There is no abdominal tenderness. There is no guarding or rebound.  Skin:    General: Skin is warm and dry.  Neurological:     Mental Status: He is alert and oriented to person, place, and time.  Psychiatric:        Mood and Affect: Mood normal.        Behavior: Behavior normal.        Thought Content: Thought content  normal.        Judgment: Judgment normal.    Assessment & Plan:   1. Encounter to establish care Reviewed available information and discussed healthcare concerns with patient.  2. Need for hepatitis C screening test Discussed screening recommendations.  Patient has no concerns and no risk factors for exposure so deferring today.  3. Screening for HIV (human immunodeficiency virus) Discussed screening recommendations.  Patient has no concerns and no risk factors for exposure so deferring today.  4. Diverticulosis No left lower quadrant tenderness today.  Checking CBC to evaluate for leukocytosis.  Reviewed possible contributors to his symptoms.  Would recommend getting in with GI but he would like to hold off on referral today.  Recommend starting a probiotic as discussed with his last GI provider.  Also provided information on the FODMAP diet and advised to do food trials to see if he can identify what is causing his symptoms.  5. Hypogonadism in male Checking testosterone today. - Testosterone  6. Anxiety Since his anxiety is more situational, starting BuSpar 5-15 mg twice daily as needed.  7. Left hand pain Left hand x-rays today.  Recommend avoiding activities that exacerbate symptoms.  Okay to use Tylenol/ibuprofen and other conservative measures for discomfort. - DG Hand Complete Left; Future  8. Chronic gout involving toe of right foot without tophus, unspecified cause Checking CBC and uric acid today.  Continue allopurinol 100 mg twice daily.  Refills  sent to pharmacy. - COMPLETE METABOLIC PANEL WITH GFR - allopurinol (ZYLOPRIM) 100 MG tablet; Take 1 tablet (100 mg total) by mouth 2 (two) times daily. TAKE ONE TABLET TWICE DAILY  Dispense: 180 tablet; Refill: 3 - Uric acid  9. Preventative health care Checking CBC with differential, CMP, and lipid panel today. - CBC with Differential/Platelet - COMPLETE METABOLIC PANEL WITH GFR - Lipid panel  10. Thyroid disorder screen Checking TSH today. - TSH  11. Trouble in sleeping Since he does have some risk factors for sleep apnea, ordering home sleep study today.  Advised patient to watch for this coming in the mail. - Home sleep test  12. Atrial fibrillation, unspecified type [facility]) Managed by cardiology.  Getting a home sleep study today. - Home sleep test    Outpatient Encounter Medications as of 09/26/2020  Medication Sig   amLODipine (NORVASC) 5 MG tablet TAKE 1 TABLET (5 MG TOTAL) BY MOUTH DAILY.   apixaban (ELIQUIS) 5 MG TABS tablet Take 1 tablet (5 mg total) by mouth 2 (two) times daily.   atorvastatin (LIPITOR) 20 MG tablet Take 1 tablet (20 mg total) by mouth daily.   busPIRone (BUSPAR) 5 MG tablet Take 1-3 tablets (5-15 mg total) by mouth 2 (two) times daily as needed.   metoprolol succinate (TOPROL-XL) 25 MG 24 hr tablet Take 3 tablets (75 mg total) by mouth daily. TAKE WITH OR IMMEDIATELY FOLLOWING A MEAL.   [DISCONTINUED] allopurinol (ZYLOPRIM) 100 MG tablet TAKE ONE TABLET TWICE DAILY   allopurinol (ZYLOPRIM) 100 MG tablet Take 1 tablet (100 mg total) by mouth 2 (two) times daily. TAKE ONE TABLET TWICE DAILY   No facility-administered encounter medications on file as of 09/26/2020.    Follow-up: Return in about 6 months (around 03/28/2021) for gout/anxiety/GI issues follow up.   Thayer Ohm, DNP, APRN, FNP-BC [facility]

## 2020-09-26 NOTE — Patient Instructions (Signed)
Low-FODMAP Eating Plan °FODMAP stands for fermentable oligosaccharides, disaccharides, monosaccharides, and polyols. These are sugars that are hard for some people to digest. A low-FODMAP eating plan may help some people who have irritable bowel syndrome (IBS) and certain other bowel (intestinal) diseases to manage their symptoms. °This meal plan can be complicated to follow. Work with a diet and nutrition specialist (dietitian) to make a low-FODMAP eating plan that is right for you. A dietitian can help make sure that you get enough nutrition from this diet. °What are tips for following this plan? °Reading food labels °Check labels for hidden FODMAPs such as: °High-fructose syrup. °Honey. °Agave. °Natural fruit flavors. °Onion or garlic powder. °Choose low-FODMAP foods that contain 3-4 grams of fiber per serving. °Check food labels for serving sizes. Eat only one serving at a time to make sure FODMAP levels stay low. °Shopping °Shop with a list of foods that are recommended on this diet and make a meal plan. °Meal planning °Follow a low-FODMAP eating plan for up to 6 weeks, or as told by your health care provider or dietitian. °To follow the eating plan: °Eliminate high-FODMAP foods from your diet completely. Choose only low-FODMAP foods to eat. You will do this for 2-6 weeks. °Gradually reintroduce high-FODMAP foods into your diet one at a time. Most people should wait a few days before introducing the next new high-FODMAP food into their meal plan. Your dietitian can recommend how quickly you may reintroduce foods. °Keep a daily record of what and how much you eat and drink. Make note of any symptoms that you have after eating. °Review your daily record with a dietitian regularly to identify which foods you can eat and which foods you should avoid. °General tips °Drink enough fluid each day to keep your urine pale yellow. °Avoid processed foods. These often have added sugar and may be high in FODMAPs. °Avoid most  dairy products, whole grains, and sweeteners. °Work with a dietitian to make sure you get enough fiber in your diet. °Avoid high FODMAP foods at meals to manage symptoms. °Recommended foods °Fruits °Bananas, oranges, tangerines, lemons, limes, blueberries, raspberries, strawberries, grapes, cantaloupe, honeydew melon, kiwi, papaya, passion fruit, and pineapple. Limited amounts of dried cranberries, banana chips, and shredded coconut. °Vegetables °Eggplant, zucchini, cucumber, peppers, green beans, bean sprouts, lettuce, arugula, kale, Swiss chard, spinach, collard greens, bok choy, summer squash, potato, and tomato. Limited amounts of corn, carrot, and sweet potato. Green parts of scallions. °Grains °Gluten-free grains, such as rice, oats, buckwheat, quinoa, corn, polenta, and millet. Gluten-free pasta, bread, or cereal. Rice noodles. Corn tortillas. °Meats and other proteins °Unseasoned beef, pork, poultry, or fish. Eggs. Bacon. Tofu (firm) and tempeh. Limited amounts of nuts and seeds, such as almonds, walnuts, brazil nuts, pecans, peanuts, nut butters, pumpkin seeds, chia seeds, and sunflower seeds. °Dairy °Lactose-free milk, yogurt, and kefir. Lactose-free cottage cheese and ice cream. Non-dairy milks, such as almond, coconut, hemp, and rice milk. Non-dairy yogurt. Limited amounts of goat cheese, brie, mozzarella, parmesan, swiss, and other hard cheeses. °Fats and oils °Butter-free spreads. Vegetable oils, such as olive, canola, and sunflower oil. °Seasoning and other foods °Artificial sweeteners with names that do not end in "ol," such as aspartame, saccharine, and stevia. Maple syrup, white table sugar, raw sugar, brown sugar, and molasses. Mayonnaise, soy sauce, and tamari. Fresh basil, coriander, parsley, rosemary, and thyme. °Beverages °Water and mineral water. Sugar-sweetened soft drinks. Small amounts of orange juice or cranberry juice. Black and green tea. Most dry wines. Coffee. °  The items listed above  may not be a complete list of foods and beverages you can eat. Contact a dietitian for more information. °Foods to avoid °Fruits °Fresh, dried, and juiced forms of apple, pear, watermelon, peach, plum, cherries, apricots, blackberries, boysenberries, figs, nectarines, and mango. Avocado. °Vegetables °Chicory root, artichoke, asparagus, cabbage, snow peas, Brussels sprouts, broccoli, sugar snap peas, mushrooms, celery, and cauliflower. Onions, garlic, leeks, and the white part of scallions. °Grains °Wheat, including kamut, durum, and semolina. Barley and bulgur. Couscous. Wheat-based cereals. Wheat noodles, bread, crackers, and pastries. °Meats and other proteins °Fried or fatty meat. Sausage. Cashews and pistachios. Soybeans, baked beans, black beans, chickpeas, kidney beans, fava beans, navy beans, lentils, black-eyed peas, and split peas. °Dairy °Milk, yogurt, ice cream, and soft cheese. Cream and sour cream. Milk-based sauces. Custard. Buttermilk. Soy milk. °Seasoning and other foods °Any sugar-free gum or candy. Foods that contain artificial sweeteners such as sorbitol, mannitol, isomalt, or xylitol. Foods that contain honey, high-fructose corn syrup, or agave. Bouillon, vegetable stock, beef stock, and chicken stock. Garlic and onion powder. Condiments made with onion, such as hummus, chutney, pickles, relish, salad dressing, and salsa. Tomato paste. °Beverages °Chicory-based drinks. Coffee substitutes. Chamomile tea. Fennel tea. Sweet or fortified wines such as port or sherry. Diet soft drinks made with isomalt, mannitol, maltitol, sorbitol, or xylitol. Apple, pear, and mango juice. Juices with high-fructose corn syrup. °The items listed above may not be a complete list of foods and beverages you should avoid. Contact a dietitian for more information. °Summary °FODMAP stands for fermentable oligosaccharides, disaccharides, monosaccharides, and polyols. These are sugars that are hard for some people to  digest. °A low-FODMAP eating plan is a short-term diet that helps to ease symptoms of certain bowel diseases. °The eating plan usually lasts up to 6 weeks. After that, high-FODMAP foods are reintroduced gradually and one at a time. This can help you find out which foods may be causing symptoms. °A low-FODMAP eating plan can be complicated. It is best to work with a dietitian who has experience with this type of plan. °This information is not intended to replace advice given to you by your health care provider. Make sure you discuss any questions you have with your health care provider. °Document Revised: 08/10/2019 Document Reviewed: 08/10/2019 °Elsevier Patient Education © 2022 [employer] Inc. ° °

## 2020-09-27 LAB — COMPLETE METABOLIC PANEL WITH GFR
AG Ratio: 2.1 (calc) (ref 1.0–2.5)
ALT: 26 U/L (ref 9–46)
AST: 20 U/L (ref 10–35)
Albumin: 4.6 g/dL (ref 3.6–5.1)
Alkaline phosphatase (APISO): 60 U/L (ref 35–144)
BUN: 16 mg/dL (ref 7–25)
CO2: 28 mmol/L (ref 20–32)
Calcium: 10 mg/dL (ref 8.6–10.3)
Chloride: 106 mmol/L (ref 98–110)
Creat: 1.15 mg/dL (ref 0.70–1.25)
GFR, Est African American: 79 mL/min/{1.73_m2} (ref >=60)
GFR, Est Non African American: 68 mL/min/{1.73_m2} (ref >=60)
Globulin: 2.2 g/dL (calc) (ref 1.9–3.7)
Glucose, Bld: 105 mg/dL — ABNORMAL HIGH (ref 65–99)
Potassium: 4.8 mmol/L (ref 3.5–5.3)
Sodium: 142 mmol/L (ref 135–146)
Total Bilirubin: 0.8 mg/dL (ref 0.2–1.2)
Total Protein: 6.8 g/dL (ref 6.1–8.1)

## 2020-09-27 LAB — CBC WITH DIFFERENTIAL/PLATELET
Absolute Monocytes: 631 cells/uL (ref 200–950)
Basophils Absolute: 41 cells/uL (ref 0–200)
Basophils Relative: 0.5 %
Eosinophils Absolute: 139 cells/uL (ref 15–500)
Eosinophils Relative: 1.7 %
HCT: 46.7 % (ref 38.5–50.0)
Hemoglobin: 16.6 g/dL (ref 13.2–17.1)
Lymphs Abs: 2632 cells/uL (ref 850–3900)
MCH: 32.9 pg (ref 27.0–33.0)
MCHC: 35.5 g/dL (ref 32.0–36.0)
MCV: 92.5 fL (ref 80.0–100.0)
MPV: 10.5 fL (ref 7.5–12.5)
Monocytes Relative: 7.7 %
Neutro Abs: 4756 cells/uL (ref 1500–7800)
Neutrophils Relative %: 58 %
Platelets: 268 10*3/uL (ref 140–400)
RBC: 5.05 10*6/uL (ref 4.20–5.80)
RDW: 12.4 % (ref 11.0–15.0)
Total Lymphocyte: 32.1 %
WBC: 8.2 10*3/uL (ref 3.8–10.8)

## 2020-09-27 LAB — LIPID PANEL
Cholesterol: 156 mg/dL (ref <200)
HDL: 51 mg/dL (ref >=40)
LDL Cholesterol (Calc): 89 mg/dL (calc)
Non-HDL Cholesterol (Calc): 105 mg/dL (calc) (ref <130)
Total CHOL/HDL Ratio: 3.1 (calc) (ref <5.0)
Triglycerides: 74 mg/dL (ref <150)

## 2020-09-27 LAB — TSH: TSH: 2.73 mIU/L (ref 0.40–4.50)

## 2020-09-27 LAB — TESTOSTERONE: Testosterone: 525 ng/dL (ref 250–827)

## 2020-09-27 LAB — URIC ACID: Uric Acid, Serum: 5.3 mg/dL (ref 4.0–8.0)

## 2020-09-30 ENCOUNTER — Other Ambulatory Visit: Payer: Self-pay

## 2020-09-30 ENCOUNTER — Telehealth: Payer: Self-pay

## 2020-09-30 DIAGNOSIS — I4891 Unspecified atrial fibrillation: Secondary | ICD-10-CM

## 2020-09-30 DIAGNOSIS — G479 Sleep disorder, unspecified: Secondary | ICD-10-CM

## 2020-09-30 NOTE — Telephone Encounter (Signed)
Dale Rivera from [facility] called and said that the order for the sleep study was put in as completed so they are not able to schedule the appt. They are requesting the order be re-entered and marked as a future order so that it will drop back into their work schedule que. Order re-entered and I called and LVM letting them know this was done and to call back with any further questions or concerns.

## 2020-10-16 ENCOUNTER — Ambulatory Visit (HOSPITAL_BASED_OUTPATIENT_CLINIC_OR_DEPARTMENT_OTHER): Payer: PRIVATE HEALTH INSURANCE | Attending: Medical-Surgical | Admitting: Internal Medicine

## 2020-10-16 ENCOUNTER — Other Ambulatory Visit: Payer: Self-pay

## 2020-10-16 VITALS — Ht 70.0 in | Wt 218.0 lb

## 2020-10-16 DIAGNOSIS — G479 Sleep disorder, unspecified: Secondary | ICD-10-CM | POA: Diagnosis present

## 2020-10-16 DIAGNOSIS — I4891 Unspecified atrial fibrillation: Secondary | ICD-10-CM | POA: Diagnosis not present

## 2020-10-16 DIAGNOSIS — G4733 Obstructive sleep apnea (adult) (pediatric): Secondary | ICD-10-CM | POA: Diagnosis not present

## 2020-10-19 DIAGNOSIS — G479 Sleep disorder, unspecified: Secondary | ICD-10-CM

## 2020-10-19 NOTE — Procedures (Signed)
   Patient Name: Dale Rivera, Dale Rivera Date: 10/16/2020 Gender: Male D.O.B: Dec 27, 1960 Age (years): 46 Referring Provider: Christen Butter NP Height (inches): 70 Interpreting Physician: Jetty Duhamel MD, ABSM Weight (lbs): 218 RPSGT: [place] BMI: 31 MRN: [id number] Neck Size:  CLINICAL INFORMATION Sleep Study Type: HST Indication for sleep study: DIMS, OS Epworth Sleepiness Score: 2  SLEEP STUDY TECHNIQUE A multi-channel overnight portable sleep study was performed. The channels recorded were: nasal airflow, thoracic respiratory movement, and oxygen saturation with a pulse oximetry. Snoring was also monitored.  MEDICATIONS Patient self administered medications include: none reported.  SLEEP ARCHITECTURE Patient was studied for 488.9 minutes. The sleep efficiency was 100.0 % and the patient was supine for 36.1%. The arousal index was 0.0 per hour.  RESPIRATORY PARAMETERS The overall AHI was 48.6 per hour, with a central apnea index of 0 per hour. The oxygen nadir was 74% during sleep.  CARDIAC DATA Mean heart rate during sleep was 64.8 bpm.  IMPRESSIONS - Severe obstructive sleep apnea occurred during this study (AHI = 48.6/h). - Severe oxygen desaturation was noted during this study (Min O2 = 74%). Mean O2 saturation 92% - Patient snored.  DIAGNOSIS - Obstructive Sleep Apnea (G47.33)  RECOMMENDATIONS - Suggest CPAP titration sleep study or autopap. Management consultation or other options would be based on clinical judgment. - Be careful with alcohol, sedatives and other CNS depressants that may worsen sleep apnea and disrupt normal sleep architecture. - Sleep hygiene should be reviewed to assess factors that may improve sleep quality. - Weight management and regular exercise should be initiated or continued.  [Electronically signed] 10/19/2020 02:56 PM  Jetty Duhamel MD, ABSM Diplomate, American Board of Sleep Medicine   NPI: [id number]                           Jetty Duhamel Diplomate, American Board of Sleep Medicine  ELECTRONICALLY SIGNED ON:  10/19/2020, 2:54 PM [facility] PH: [phone]   FX: [phone] ACCREDITED BY THE AMERICAN ACADEMY OF SLEEP MEDICINE

## 2020-10-28 ENCOUNTER — Encounter: Payer: Self-pay | Admitting: Medical-Surgical

## 2020-12-04 NOTE — Progress Notes (Signed)
HPI: FU hypertrophic cardiomyopathy. 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 I did have Dr. Graciela Husbands evaluate the patient. He felt these were most likely vagal in etiology. He recommended continued beta blocker and no ICD. Nuclear study showed ejection fraction 27% but felt to be error; normal perfusion; normal systolic blood pressure response to exercise.  Holter monitor October 2018 showed sinus with PACs, PVCs and 4 beats nonsustained ventricular tachycardia. Cardiac MRI December 2019 suggestive of mid LV variant hypertrophic cardiomyopathy with asymmetric hypertrophy of the basal septal segment and mid LV segments. Ejection fraction 64%.  There was delayed enhancement involving the mid wall in the basal anteroseptum and mid anterior and lateral walls suggesting higher risk of VT. Echo repeated 2/22 showed normal LV function, severe LVH, LVOT gradient of 32 mmHg with valsalva, mild LAE. Also with atrial fibrillation. Seen by SK and evaluation for ICD in progress. Since I last saw him, he went back into atrial fibrillation approximately 5 days ago.  He has some fatigue with this.  Mild dyspnea on exertion but no orthopnea, PND, pedal edema, exertional chest pain, palpitations or syncope.  Current Outpatient Medications  Medication Sig Dispense Refill   allopurinol (ZYLOPRIM) 100 MG tablet Take 1 tablet (100 mg total) by mouth 2 (two) times daily. TAKE ONE TABLET TWICE DAILY 180 tablet 3   AMBULATORY NON FORMULARY MEDICATION Continuous positive airway pressure (CPAP) machine set on AutoPAP (4-20 cmH2O), with all supplemental supplies as needed. 1 each 0   amLODipine (NORVASC) 5 MG tablet TAKE 1 TABLET (5 MG TOTAL) BY MOUTH DAILY. 30 tablet 11   apixaban (ELIQUIS) 5 MG TABS tablet Take 1 tablet (5 mg total) by mouth 2 (two) times daily. 180 tablet 3   atorvastatin (LIPITOR) 20 MG tablet Take 1 tablet (20 mg total) by mouth daily. 90 tablet 3    busPIRone (BUSPAR) 5 MG tablet Take 1-3 tablets (5-15 mg total) by mouth 2 (two) times daily as needed. 60 tablet 0   metoprolol succinate (TOPROL-XL) 25 MG 24 hr tablet Take 3 tablets (75 mg total) by mouth daily. TAKE WITH OR IMMEDIATELY FOLLOWING A MEAL. 240 tablet 3   No current facility-administered medications for this visit.     Past Medical History:  Diagnosis Date   Atrial fibrillation (HCC)    Cardiomyopathy    hypertrophic   Gout    HYPERLIPIDEMIA 07/02/2009   Qualifier: Diagnosis of  By: Jens Som, MD, Lyn Hollingshead    Hypertension    Hypertrophic obstructive cardiomyopathy (HCC) 07/03/2009   Qualifier: Diagnosis of  By: Jens Som, MD, Lyn Hollingshead    Lower extremity edema 11/15/2013   Palpitations 09/18/2009   Qualifier: Diagnosis of  By: Jens Som, MD, Lyn Hollingshead    SYNCOPE 07/02/2009   Qualifier: Diagnosis of  By: Jens Som, MD, Lyn Hollingshead     Past Surgical History:  Procedure Laterality Date   CERVICAL DISC SURGERY     TONSILLECTOMY      Social History   Socioeconomic History   Marital status: Married    Spouse name: Not on file   Number of children: Not on file   Years of education: Not on file   Highest education level: Not on file  Occupational History   Not on file  Tobacco Use   Smoking status: Never   Smokeless tobacco: Never   Tobacco comments:    tobacco use- no   Vaping Use  Vaping Use: Never used  Substance and Sexual Activity   Alcohol use: Yes    Alcohol/week: 2.0 standard drinks    Types: 2 Standard drinks or equivalent per week   Drug use: Never   Sexual activity: Yes  Other Topics Concern   Not on file  Social History Narrative   Full time.    Social Determinants of Health   Financial Resource Strain: Not on file  Food Insecurity: Not on file  Transportation Needs: Not on file  Physical Activity: Not on file  Stress: Not on file  Social Connections: Not on file  Intimate Partner Violence:  Not on file    Family History  Problem Relation Age of Onset   Hypertension Mother    Heart failure Mother    Heart attack Father    Hypertension Sister    Coronary artery disease Other    Diabetes Other        siblings    ROS: no fevers or chills, productive cough, hemoptysis, dysphasia, odynophagia, melena, hematochezia, dysuria, hematuria, rash, seizure activity, orthopnea, PND, pedal edema, claudication. Remaining systems are negative.  Physical Exam: Well-developed well-nourished in no acute distress.  Skin is warm and dry.  HEENT is normal.  Neck is supple.  Chest is clear to auscultation with normal expansion.  Cardiovascular exam is irregular Abdominal exam nontender or distended. No masses palpated. Extremities show no edema. neuro grossly intact  ECG-atrial fibrillation at a rate of 76, left ventricular hypertrophy with repolarization abnormality.  Personally reviewed  A/P  1 paroxysmal atrial fibrillation-Continue beta-blocker and apixaban.  Back in atrial fibrillation.  He does have some fatigue with this and I think maintaining sinus rhythm would be optimal.  I will refer to the atrial fibrillation clinic for suggestions concerning antiarrhythmic therapy.  Given his hypertrophic cardiomyopathy disopyramide may be best option.  Note he has missed no doses of apixaban.  2 hypertrophic cardiomyopathy-patient remains asymptomatic.  Continue beta-blocker.  Previously evaluated by Dr. Graciela Husbands for ICD.  We will asked Dr. Graciela Husbands to review as final decision pending.  3 hypertension-blood pressure controlled.  Continue present medications and follow.  4 hyperlipidemia-continue statin.  5 palpitations-controlled.  Continue beta-blocker.  6 family history of coronary artery disease-patient's father had a myocardial infarction in his late 70s.  We will arrange calcium score for risk stratification.  Olga Millers, MD

## 2020-12-13 ENCOUNTER — Encounter: Payer: Self-pay | Admitting: Medical-Surgical

## 2020-12-16 MED ORDER — AMBULATORY NON FORMULARY MEDICATION: 0 refills | Status: AC

## 2020-12-18 ENCOUNTER — Other Ambulatory Visit: Payer: Self-pay

## 2020-12-18 ENCOUNTER — Encounter: Payer: Self-pay | Admitting: Cardiology

## 2020-12-18 ENCOUNTER — Encounter: Payer: Self-pay | Admitting: *Deleted

## 2020-12-18 ENCOUNTER — Ambulatory Visit (INDEPENDENT_AMBULATORY_CARE_PROVIDER_SITE_OTHER): Payer: PRIVATE HEALTH INSURANCE | Admitting: Cardiology

## 2020-12-18 VITALS — BP 118/72 | HR 76 | Ht 70.0 in | Wt 224.0 lb

## 2020-12-18 DIAGNOSIS — I421 Obstructive hypertrophic cardiomyopathy: Secondary | ICD-10-CM

## 2020-12-18 DIAGNOSIS — R002 Palpitations: Secondary | ICD-10-CM

## 2020-12-18 DIAGNOSIS — I4891 Unspecified atrial fibrillation: Secondary | ICD-10-CM

## 2020-12-18 DIAGNOSIS — E7800 Pure hypercholesterolemia, unspecified: Secondary | ICD-10-CM

## 2020-12-18 NOTE — Patient Instructions (Signed)
Medication Instructions:   Your physician recommends that you continue on your current medications as directed. Please refer to the Current Medication list given to you today.  *If you need a refill on your cardiac medications before your next appointment, please call your pharmacy*   Lab Work:  -None  If you have labs (blood work) drawn today and your tests are completely normal, you will receive your results only by: MyChart Message (if you have MyChart) OR A paper copy in the mail If you have any lab test that is abnormal or we need to change your treatment, we will call you to review the results.   Testing/Procedures: You will be called to set up appointment for calcium scoring.  AFIB CLINIC INFORMATION: Your appointment is scheduled on: Tuesday, September 20 at 10:30 am. . Please arrive 15 minutes early for check-in. The AFib Clinic is located in the Heart and Vascular Specialty Clinics at [facility]. Parking instructions/directions: Government social research officer C ([address]). When you pull in to Entrance C, there is an underground parking garage to your right. The code to enter the garage is 4455. Take the elevators to the first floor. Follow the signs to the Heart and Vascular Specialty Clinics. You will see registration at the end of the hallway.  Phone number: [phone]   Follow-Up: At [facility], you and your health needs are our priority.  As part of our continuing mission to provide you with exceptional heart care, we have created designated Provider Care Teams.  These Care Teams include your primary Cardiologist (physician) and Advanced Practice Providers (APPs -  Physician Assistants and Nurse Practitioners) who all work together to provide you with the care you need, when you need it.  We recommend signing up for the patient portal called "MyChart".  Sign up information is provided on this After Visit Summary.  MyChart is used to connect with patients for Virtual  Visits (Telemedicine).  Patients are able to view lab/test results, encounter notes, upcoming appointments, etc.  Non-urgent messages can be sent to your provider as well.   To learn more about what you can do with MyChart, go to [email or web address].    Your next appointment:   6 month(s)  The format for your next appointment:   In Person  Provider:   Dr. Jens Som   Other Instructions Dr. Jens Som will reach out to Dr.Klein.

## 2020-12-19 ENCOUNTER — Telehealth: Payer: Self-pay

## 2020-12-19 NOTE — Telephone Encounter (Signed)
Diane from [facility] called stating that she needs additional information to be able to process the order for patient's CPAP.   Attempted to call; phone rang until busy signal.

## 2020-12-24 ENCOUNTER — Encounter (HOSPITAL_COMMUNITY): Payer: Self-pay | Admitting: Physician Assistant

## 2020-12-24 ENCOUNTER — Other Ambulatory Visit: Payer: Self-pay

## 2020-12-24 ENCOUNTER — Ambulatory Visit (HOSPITAL_COMMUNITY)
Admission: RE | Admit: 2020-12-24 | Discharge: 2020-12-24 | Disposition: A | Discharge status: Home / Self Care | Payer: PRIVATE HEALTH INSURANCE | Source: Ambulatory Visit | Attending: Physician Assistant | Admitting: Physician Assistant

## 2020-12-24 VITALS — BP 130/80 | HR 71 | Ht 70.0 in | Wt 224.4 lb

## 2020-12-24 DIAGNOSIS — E785 Hyperlipidemia, unspecified: Secondary | ICD-10-CM | POA: Diagnosis not present

## 2020-12-24 DIAGNOSIS — Z79899 Other long term (current) drug therapy: Secondary | ICD-10-CM | POA: Diagnosis not present

## 2020-12-24 DIAGNOSIS — I10 Essential (primary) hypertension: Secondary | ICD-10-CM | POA: Diagnosis not present

## 2020-12-24 DIAGNOSIS — Z8249 Family history of ischemic heart disease and other diseases of the circulatory system: Secondary | ICD-10-CM | POA: Insufficient documentation

## 2020-12-24 DIAGNOSIS — G4733 Obstructive sleep apnea (adult) (pediatric): Secondary | ICD-10-CM | POA: Insufficient documentation

## 2020-12-24 DIAGNOSIS — Z7901 Long term (current) use of anticoagulants: Secondary | ICD-10-CM | POA: Insufficient documentation

## 2020-12-24 DIAGNOSIS — I480 Paroxysmal atrial fibrillation: Secondary | ICD-10-CM | POA: Diagnosis present

## 2020-12-24 DIAGNOSIS — E669 Obesity, unspecified: Secondary | ICD-10-CM | POA: Diagnosis not present

## 2020-12-24 DIAGNOSIS — I421 Obstructive hypertrophic cardiomyopathy: Secondary | ICD-10-CM | POA: Diagnosis not present

## 2020-12-24 DIAGNOSIS — Z7182 Exercise counseling: Secondary | ICD-10-CM | POA: Insufficient documentation

## 2020-12-24 DIAGNOSIS — Z6832 Body mass index (BMI) 32.0-32.9, adult: Secondary | ICD-10-CM | POA: Insufficient documentation

## 2020-12-24 NOTE — Progress Notes (Addendum)
Primary Care Physician: Christen Butter, NP Primary Cardiologist: Dr Jens Som Primary Electrophysiologist: Dr Graciela Husbands Referring Physician: Dr Jens Som   Dale Rivera is a 60 y.o. male with a history of hypertrophic CM, HTN, HLD, atrial fibrillation who presents for consultation in the [facility]. The patient was initially diagnosed with atrial fibrillation 04/24/20 after presenting to Dr Ludwig Clarks office for a routine follow up. Patient was asymptomatic. Patient is on Eliquis for a CHADS2VASC score of 1. He reports that his afib is paroxysmal. He has a Optician, dispensing device which he checks daily. At his visit 12/18/20 he had been in afib for several days and did have symptoms of increased fatigue. Of note, he has recently been diagnosed with OSA and is waiting for a CPAP machine.   Today, he denies symptoms of palpitations, chest pain, shortness of breath, orthopnea, PND, lower extremity edema, dizziness, presyncope, syncope, bleeding, or neurologic sequela. The patient is tolerating medications without difficulties and is otherwise without complaint today.    Atrial Fibrillation Risk Factors:  he does have symptoms or diagnosis of sleep apnea. he is waiting for CPAP to arrive. he does not have a history of rheumatic fever. he does not have a history of alcohol use. The patient does have a history of early familial atrial fibrillation or other arrhythmias. Mother  he has a BMI of Body mass index is 32.2 kg/m.Marland Kitchen Filed Weights   12/24/20 1037  Weight: 101.8 kg    Family History  Problem Relation Age of Onset   Hypertension Mother    Heart failure Mother    Heart attack Father    Hypertension Sister    Coronary artery disease Other    Diabetes Other        siblings     Atrial Fibrillation Management history:  Previous antiarrhythmic drugs: none Previous cardioversions: none Previous ablations: none CHADS2VASC score: 1 Anticoagulation history:  Eliquis   Past Medical History:  Diagnosis Date   Atrial fibrillation (HCC)    Cardiomyopathy    hypertrophic   Gout    HYPERLIPIDEMIA 07/02/2009   Qualifier: Diagnosis of  By: Jens Som, MD, Lyn Hollingshead    Hypertension    Hypertrophic obstructive cardiomyopathy (HCC) 07/03/2009   Qualifier: Diagnosis of  By: Jens Som, MD, Lyn Hollingshead    Lower extremity edema 11/15/2013   Palpitations 09/18/2009   Qualifier: Diagnosis of  By: Jens Som, MD, Lyn Hollingshead    SYNCOPE 07/02/2009   Qualifier: Diagnosis of  By: Jens Som, MD, Lyn Hollingshead    Past Surgical History:  Procedure Laterality Date   CERVICAL DISC SURGERY     TONSILLECTOMY      Current Outpatient Medications  Medication Sig Dispense Refill   acetaminophen (TYLENOL) 650 MG CR tablet Take 650 mg by mouth every 8 (eight) hours as needed for pain.     allopurinol (ZYLOPRIM) 100 MG tablet Take 1 tablet (100 mg total) by mouth 2 (two) times daily. TAKE ONE TABLET TWICE DAILY 180 tablet 3   AMBULATORY NON FORMULARY MEDICATION Continuous positive airway pressure (CPAP) machine set on AutoPAP (4-20 cmH2O), with all supplemental supplies as needed. 1 each 0   amLODipine (NORVASC) 5 MG tablet TAKE 1 TABLET (5 MG TOTAL) BY MOUTH DAILY. 30 tablet 11   apixaban (ELIQUIS) 5 MG TABS tablet Take 1 tablet (5 mg total) by mouth 2 (two) times daily. 180 tablet 3   atorvastatin (LIPITOR) 20 MG tablet Take 1 tablet (20 mg total) by mouth  daily. 90 tablet 3   busPIRone (BUSPAR) 5 MG tablet Take 1-3 tablets (5-15 mg total) by mouth 2 (two) times daily as needed. 60 tablet 0   metoprolol succinate (TOPROL-XL) 25 MG 24 hr tablet Take 3 tablets (75 mg total) by mouth daily. TAKE WITH OR IMMEDIATELY FOLLOWING A MEAL. 240 tablet 3   No current facility-administered medications for this encounter.    No Known Allergies  Social History   Socioeconomic History   Marital status: Married    Spouse name: Not on file    Number of children: Not on file   Years of education: Not on file   Highest education level: Not on file  Occupational History   Not on file  Tobacco Use   Smoking status: Never   Smokeless tobacco: Never   Tobacco comments:    Never smoke (12/24/2020)  Vaping Use   Vaping Use: Never used  Substance and Sexual Activity   Alcohol use: Yes    Alcohol/week: 3.0 - 4.0 standard drinks    Types: 1 - 2 Glasses of wine, 2 Cans of beer per week   Drug use: Never   Sexual activity: Yes  Other Topics Concern   Not on file  Social History Narrative   Full time.    Social Determinants of Health   Financial Resource Strain: Not on file  Food Insecurity: Not on file  Transportation Needs: Not on file  Physical Activity: Not on file  Stress: Not on file  Social Connections: Not on file  Intimate Partner Violence: Not on file     ROS- All systems are reviewed and negative except as per the HPI above.  Physical Exam: Vitals:   12/24/20 1037  BP: 130/80  Pulse: 71  Weight: 101.8 kg  Height: 5\' 10"  (1.778 m)    GEN- The patient is a well appearing obese male, alert and oriented x 3 today.   Head- normocephalic, atraumatic Eyes-  Sclera clear, conjunctiva pink Ears- hearing intact Oropharynx- clear Neck- supple  Lungs- Clear to ausculation bilaterally, normal work of breathing Heart- Regular rate and rhythm, no murmurs, rubs or gallops  GI- soft, NT, ND, + BS Extremities- no clubbing, cyanosis, or edema MS- no significant deformity or atrophy Skin- no rash or lesion Psych- euthymic mood, full affect Neuro- strength and sensation are intact  Wt Readings from Last 3 Encounters:  12/24/20 101.8 kg  12/18/20 101.6 kg  10/16/20 98.9 kg    EKG today demonstrates  SR, 1st degree AV block, LVH with repol changes, NO STEMI Vent. rate 71 BPM PR interval 212 ms QRS duration 94 ms QT/QTcB 420/456 ms  Echo 05/20/20 demonstrated  1. Left ventricular ejection fraction, by  estimation, is 55 to 60%. The  left ventricle has normal function. The left ventricle has no regional  wall motion abnormalities. There is severe concentric left ventricular  hypertrophy. Increased LVOT gradient 32  mmHG with valsalva. Intracavitary obliteration is noted. No systolic anterior motion. Left ventricular diastolic parameters are indeterminate.   2. Right ventricular systolic function is normal. The right ventricular  size is normal.   3. Left atrial size was mildly dilated.   4. The mitral valve is normal in structure. No evidence of mitral valve  regurgitation. No evidence of mitral stenosis.   5. The aortic valve is normal in structure. Aortic valve regurgitation is  not visualized. No aortic stenosis is present.   6. Unable to determine pulmonary artery systolic pressure, No TR jet  spectral display.   Epic records are reviewed at length today  CHA2DS2-VASc Score = 1  The patient's score is based upon: CHF History: 0 HTN History: 1 Diabetes History: 0 Stroke History: 0 Vascular Disease History: 0 Age Score: 0 Gender Score: 0      ASSESSMENT AND PLAN: 1. Paroxysmal Atrial Fibrillation (ICD10:  I48.0) The patient's CHA2DS2-VASc score is 1, indicating a 0.6% annual risk of stroke.   General education about afib provided and questions answered. We also discussed his stroke risk and the risks and benefits of anticoagulation. We also discussed therapeutic options for rhythm control including sotalol, disopyramide, amiodarone, and ablation.  At this time, patient did not feel his symptoms warranted any changes in therapy. Will have him continue to monitor AF burden on his Kardia device and if his burden increases, will consider one of the above. Continue Eliquis 5 mg BID. Anticoagulation indicated given HCM even with low CV score.   2. Obesity Body mass index is 32.2 kg/m. Lifestyle modification was discussed at length including regular exercise and weight  reduction.  3. Obstructive sleep apnea The importance of adequate treatment of sleep apnea was discussed today in order to improve our ability to maintain sinus rhythm long term. Patient waiting on CPAP.  4. HCM Asymptomatic Continue Toprol 75 mg daily  5. HTN Stable, no changes today.   Follow up in the AF clinic in 3-4 months, sooner if needed.    Jorja Loa PA-C Afib Clinic [facility] [address] [place], [place] [address] [phone] 12/24/2020 1:25 PM

## 2021-01-20 ENCOUNTER — Other Ambulatory Visit: Payer: Self-pay

## 2021-01-20 MED ORDER — APIXABAN 5 MG PO TABS: 5.0000 mg | ORAL_TABLET | Freq: Two times a day (BID) | ORAL | 1 refills | Status: DC

## 2021-01-20 NOTE — Telephone Encounter (Signed)
Prescription refill request for Eliquis received. Indication:afib Last office visit:fenton 12/24/20 Scr: 1.15 09/26/20 Age: 49m Weight:101.6kg

## 2021-01-23 ENCOUNTER — Telehealth: Payer: Self-pay

## 2021-01-23 NOTE — Telephone Encounter (Signed)
Sleep study notes faxed to [facility] with confirmation.

## 2021-01-28 ENCOUNTER — Encounter: Payer: Self-pay | Admitting: Medical-Surgical

## 2021-01-30 ENCOUNTER — Telehealth: Payer: Self-pay

## 2021-01-30 NOTE — Telephone Encounter (Signed)
Sleep study notes and demographic sheet faxed again to [facility] with confirmation.

## 2021-01-31 ENCOUNTER — Telehealth: Payer: Self-pay

## 2021-01-31 NOTE — Telephone Encounter (Signed)
Spoke to Diane from [facility] and she confirmed that the correct notes were faxed over. She states that "they looked like normal notes and not sleep study results." Informed Diane the sleep study report is on page 1 of notes faxed and she confirmed she had the report. Additional notes from 09/26/20 have been faxed over as requested.

## 2021-02-03 DIAGNOSIS — Z136 Encounter for screening for cardiovascular disorders: Secondary | ICD-10-CM

## 2021-02-12 ENCOUNTER — Other Ambulatory Visit: Payer: Self-pay

## 2021-02-12 ENCOUNTER — Ambulatory Visit (HOSPITAL_BASED_OUTPATIENT_CLINIC_OR_DEPARTMENT_OTHER)
Admission: RE | Admit: 2021-02-12 | Discharge: 2021-02-12 | Disposition: A | Discharge status: Home / Self Care | Payer: Self-pay | Source: Ambulatory Visit | Attending: Cardiology | Admitting: Cardiology

## 2021-02-12 DIAGNOSIS — Z136 Encounter for screening for cardiovascular disorders: Secondary | ICD-10-CM

## 2021-02-14 DIAGNOSIS — I2510 Atherosclerotic heart disease of native coronary artery without angina pectoris: Secondary | ICD-10-CM

## 2021-02-14 DIAGNOSIS — Z136 Encounter for screening for cardiovascular disorders: Secondary | ICD-10-CM

## 2021-02-14 DIAGNOSIS — E7800 Pure hypercholesterolemia, unspecified: Secondary | ICD-10-CM

## 2021-02-17 MED ORDER — ATORVASTATIN CALCIUM 20 MG PO TABS: 20.0000 mg | ORAL_TABLET | Freq: Every day | ORAL | 3 refills | Status: DC

## 2021-02-17 NOTE — Telephone Encounter (Signed)
Calcium score is elevated.  Increase Lipitor to 80 mg daily.  Check lipids and liver in 8 weeks.  Schedule stress nuclear study for risk stratification.  Olga Millers

## 2021-02-24 ENCOUNTER — Other Ambulatory Visit: Payer: Self-pay | Admitting: *Deleted

## 2021-02-24 DIAGNOSIS — I2510 Atherosclerotic heart disease of native coronary artery without angina pectoris: Secondary | ICD-10-CM

## 2021-02-24 NOTE — Progress Notes (Signed)
card

## 2021-02-25 ENCOUNTER — Encounter (HOSPITAL_COMMUNITY): Payer: Self-pay | Admitting: Cardiology

## 2021-03-03 ENCOUNTER — Telehealth (HOSPITAL_COMMUNITY): Payer: Self-pay | Admitting: *Deleted

## 2021-03-03 ENCOUNTER — Encounter: Payer: Self-pay | Admitting: Cardiology

## 2021-03-03 NOTE — Telephone Encounter (Signed)
Patient given detailed instructions per Myocardial Perfusion Study Information Sheet for the test on 03/10/21 Patient notified to arrive 15 minutes early and that it is imperative to arrive on time for appointment to keep from having the test rescheduled.  If you need to cancel or reschedule your appointment, please call the office within 24 hours of your appointment. . Patient verbalized understanding. Dale Rivera Jacqueline   

## 2021-03-05 ENCOUNTER — Encounter: Payer: Self-pay | Admitting: Medical-Surgical

## 2021-03-10 ENCOUNTER — Ambulatory Visit (HOSPITAL_COMMUNITY): Payer: PRIVATE HEALTH INSURANCE | Attending: Cardiovascular Disease

## 2021-03-10 ENCOUNTER — Other Ambulatory Visit: Payer: Self-pay

## 2021-03-10 DIAGNOSIS — I2510 Atherosclerotic heart disease of native coronary artery without angina pectoris: Secondary | ICD-10-CM | POA: Insufficient documentation

## 2021-03-10 LAB — MYOCARDIAL PERFUSION IMAGING
LV dias vol: 94 mL (ref 62–150)
LV sys vol: 63 mL
Nuc Stress EF: 33 %
Peak HR: 96 {beats}/min
Rest HR: 67 {beats}/min
Rest Nuclear Isotope Dose: 10.1 mCi
SDS: 3
SRS: 1
SSS: 4
ST Depression (mm): 0 mm
Stress Nuclear Isotope Dose: 32 mCi
TID: 1.1

## 2021-03-10 MED ORDER — TECHNETIUM TC 99M TETROFOSMIN IV KIT
32.0000 | PACK | Freq: Once | INTRAVENOUS | Status: AC | PRN
  Administered 2021-03-10: 32 via INTRAVENOUS
  Filled 2021-03-10: qty 32

## 2021-03-10 MED ORDER — TECHNETIUM TC 99M TETROFOSMIN IV KIT
10.1000 | PACK | Freq: Once | INTRAVENOUS | Status: AC | PRN
  Administered 2021-03-10: 10.1 via INTRAVENOUS
  Filled 2021-03-10: qty 11

## 2021-03-10 MED ORDER — REGADENOSON 0.4 MG/5ML IV SOLN
0.4000 mg | Freq: Once | INTRAVENOUS | Status: AC
  Administered 2021-03-10: 0.4 mg via INTRAVENOUS

## 2021-03-14 ENCOUNTER — Other Ambulatory Visit: Payer: Self-pay | Admitting: Cardiology

## 2021-03-14 DIAGNOSIS — I421 Obstructive hypertrophic cardiomyopathy: Secondary | ICD-10-CM

## 2021-04-02 ENCOUNTER — Ambulatory Visit (INDEPENDENT_AMBULATORY_CARE_PROVIDER_SITE_OTHER): Payer: PRIVATE HEALTH INSURANCE | Admitting: Medical-Surgical

## 2021-04-02 ENCOUNTER — Encounter: Payer: Self-pay | Admitting: Medical-Surgical

## 2021-04-02 ENCOUNTER — Other Ambulatory Visit: Payer: Self-pay

## 2021-04-02 VITALS — BP 124/78 | HR 78 | Resp 20 | Ht 70.0 in | Wt 223.7 lb

## 2021-04-02 DIAGNOSIS — E7800 Pure hypercholesterolemia, unspecified: Secondary | ICD-10-CM

## 2021-04-02 DIAGNOSIS — M722 Plantar fascial fibromatosis: Secondary | ICD-10-CM

## 2021-04-02 DIAGNOSIS — I421 Obstructive hypertrophic cardiomyopathy: Secondary | ICD-10-CM | POA: Diagnosis not present

## 2021-04-02 DIAGNOSIS — R140 Abdominal distension (gaseous): Secondary | ICD-10-CM

## 2021-04-02 DIAGNOSIS — R600 Localized edema: Secondary | ICD-10-CM

## 2021-04-02 DIAGNOSIS — R197 Diarrhea, unspecified: Secondary | ICD-10-CM

## 2021-04-02 DIAGNOSIS — F419 Anxiety disorder, unspecified: Secondary | ICD-10-CM

## 2021-04-02 DIAGNOSIS — R109 Unspecified abdominal pain: Secondary | ICD-10-CM

## 2021-04-02 DIAGNOSIS — I480 Paroxysmal atrial fibrillation: Secondary | ICD-10-CM | POA: Diagnosis not present

## 2021-04-02 DIAGNOSIS — G479 Sleep disorder, unspecified: Secondary | ICD-10-CM | POA: Insufficient documentation

## 2021-04-02 DIAGNOSIS — M1A9XX0 Chronic gout, unspecified, without tophus (tophi): Secondary | ICD-10-CM | POA: Diagnosis not present

## 2021-04-02 DIAGNOSIS — N529 Male erectile dysfunction, unspecified: Secondary | ICD-10-CM

## 2021-04-02 MED ORDER — SILDENAFIL CITRATE 20 MG PO TABS
20.0000 mg | ORAL_TABLET | ORAL | 11 refills | Status: DC | PRN
Start: 2021-04-02 — End: 2021-04-09

## 2021-04-02 NOTE — Progress Notes (Signed)
°  HPI with pertinent ROS:   CC: 1-month follow-up  HPI: Very pleasant 60 year old male presenting today for follow-up on:  Gout-taking allopurinol 100 mg twice daily as prescribed, tolerating well without side effects.  No gout flares since her last visit.  Compliant with recommended dietary limitations.  Erectile dysfunction-has had decreased libido for quite a while.  Uses sildenafil 40 mg daily as needed but does not use this frequently.  Would like a refill of the medication today.  Recent testosterone levels normal.  Trouble sleeping-is doing much better since he got his CPAP 3 weeks ago.  He is still getting used to the CPAP and using a nasal pillow.  Unfortunately, he has a small sore on his nare that has developed.  He is Vaseline or Neosporin topically during the day.  Anxiety-using BuSpar 5-15 mg daily as needed for anxiety.  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  GI issues-having diarrhea several times weekly that is considered watery and sometimes just loose.  Having abdominal cramping and bloating as notes this is whenever he eats almost anything.  No nausea, vomiting, melena, or hematochezia.  Colonoscopy up-to-date.  No longer has a relationship with GI and is interested in referral for further evaluation.  I reviewed the past medical history, family history, social history, surgical history, and allergies today and no changes were needed.  Please see the problem list section below in epic for further details.   Physical exam:   General: Well Developed, well nourished, and in no acute distress.  Neuro: Alert and oriented x3.  HEENT: Normocephalic, atraumatic.  Skin: Warm and dry. Cardiac: Regular rate and rhythm, no murmurs rubs or gallops, no lower extremity edema.  Respiratory: Clear to auscultation bilaterally. Not using accessory muscles, speaking in full sentences.  Impression and Recommendations:    1. Paroxysmal  atrial fibrillation (HCC) 2. Hypertrophic obstructive cardiomyopathy (HCC) Managed by cardiology.  Compliant with medications.  3. Chronic gout involving toe of right foot without tophus, unspecified cause Continue allopurinol 100 mg twice daily.  Symptoms well controlled.  4. Pure hypercholesterolemia Managed by cardiology.  Continue atorvastatin 20 mg twice daily.  5. Lower extremity edema Resolved.  6. Trouble in sleeping Continue CPAP while sleeping.  Recommend evaluating for a different size of nasal pillow due to excess pressure on nasal tissues.  Okay to use Vaseline or topical Neosporin on small sore at the edge of his nostril for the next few days.  7. Anxiety Continue BuSpar 5-15 mg as needed.  8. Erectile dysfunction, unspecified erectile dysfunction type Continue sildenafil 20-100 mg daily as needed.  9. Plantar fasciitis of left foot Discussed conservative measures.  Printed exercises provided.  Heel lifts pads provided.  10. Bloating 11. Abdominal cramping 12. Diarrhea, unspecified type Referring to GI for further evaluation per patient request. - Ambulatory referral to Gastroenterology  Return in about 6 months (around 10/01/2021) for annual physical exam or sooner if needed. ___________________________________________ Thayer Ohm, DNP, APRN, FNP-BC Primary Care and Sports Medicine [facility]

## 2021-04-07 ENCOUNTER — Encounter: Payer: Self-pay | Admitting: Medical-Surgical

## 2021-04-09 MED ORDER — SILDENAFIL CITRATE 20 MG PO TABS: 20.0000 mg | ORAL_TABLET | ORAL | 11 refills | Status: DC | PRN

## 2021-04-24 ENCOUNTER — Ambulatory Visit (HOSPITAL_COMMUNITY)
Admission: RE | Admit: 2021-04-24 | Discharge: 2021-04-24 | Disposition: A | Discharge status: Home / Self Care | Payer: PRIVATE HEALTH INSURANCE | Source: Ambulatory Visit | Attending: Physician Assistant | Admitting: Physician Assistant

## 2021-04-24 ENCOUNTER — Other Ambulatory Visit: Payer: Self-pay

## 2021-04-24 ENCOUNTER — Encounter (HOSPITAL_COMMUNITY): Payer: Self-pay | Admitting: Physician Assistant

## 2021-04-24 VITALS — BP 134/88 | HR 75 | Ht 70.0 in | Wt 222.0 lb

## 2021-04-24 DIAGNOSIS — Z7182 Exercise counseling: Secondary | ICD-10-CM | POA: Insufficient documentation

## 2021-04-24 DIAGNOSIS — E785 Hyperlipidemia, unspecified: Secondary | ICD-10-CM | POA: Diagnosis not present

## 2021-04-24 DIAGNOSIS — Z6831 Body mass index (BMI) 31.0-31.9, adult: Secondary | ICD-10-CM | POA: Insufficient documentation

## 2021-04-24 DIAGNOSIS — I421 Obstructive hypertrophic cardiomyopathy: Secondary | ICD-10-CM | POA: Diagnosis not present

## 2021-04-24 DIAGNOSIS — I10 Essential (primary) hypertension: Secondary | ICD-10-CM | POA: Diagnosis not present

## 2021-04-24 DIAGNOSIS — Z79899 Other long term (current) drug therapy: Secondary | ICD-10-CM | POA: Insufficient documentation

## 2021-04-24 DIAGNOSIS — G4733 Obstructive sleep apnea (adult) (pediatric): Secondary | ICD-10-CM | POA: Insufficient documentation

## 2021-04-24 DIAGNOSIS — Z8249 Family history of ischemic heart disease and other diseases of the circulatory system: Secondary | ICD-10-CM | POA: Insufficient documentation

## 2021-04-24 DIAGNOSIS — Z7901 Long term (current) use of anticoagulants: Secondary | ICD-10-CM | POA: Insufficient documentation

## 2021-04-24 DIAGNOSIS — I4819 Other persistent atrial fibrillation: Secondary | ICD-10-CM | POA: Diagnosis present

## 2021-04-24 DIAGNOSIS — E669 Obesity, unspecified: Secondary | ICD-10-CM | POA: Diagnosis not present

## 2021-04-24 DIAGNOSIS — Z9989 Dependence on other enabling machines and devices: Secondary | ICD-10-CM | POA: Diagnosis not present

## 2021-04-24 LAB — CBC
HCT: 49.8 % (ref 39.0–52.0)
Hemoglobin: 17.1 g/dL — ABNORMAL HIGH (ref 13.0–17.0)
MCH: 32.5 pg (ref 26.0–34.0)
MCHC: 34.3 g/dL (ref 30.0–36.0)
MCV: 94.7 fL (ref 80.0–100.0)
Platelets: 248 10*3/uL (ref 150–400)
RBC: 5.26 MIL/uL (ref 4.22–5.81)
RDW: 12.1 % (ref 11.5–15.5)
WBC: 7.9 10*3/uL (ref 4.0–10.5)
nRBC: 0 % (ref 0.0–0.2)

## 2021-04-24 LAB — COMPREHENSIVE METABOLIC PANEL
ALT: 35 U/L (ref 0–44)
AST: 27 U/L (ref 15–41)
Albumin: 4.1 g/dL (ref 3.5–5.0)
Alkaline Phosphatase: 69 U/L (ref 38–126)
Anion gap: 9 (ref 5–15)
BUN: 13 mg/dL (ref 8–23)
CO2: 24 mmol/L (ref 22–32)
Calcium: 9.3 mg/dL (ref 8.9–10.3)
Chloride: 108 mmol/L (ref 98–111)
Creatinine, Ser: 1.19 mg/dL (ref 0.61–1.24)
GFR, Estimated: >60 mL/min (ref >60)
Glucose, Bld: 122 mg/dL — ABNORMAL HIGH (ref 70–99)
Potassium: 5.1 mmol/L (ref 3.5–5.1)
Sodium: 141 mmol/L (ref 135–145)
Total Bilirubin: 1.2 mg/dL (ref 0.3–1.2)
Total Protein: 6.6 g/dL (ref 6.5–8.1)

## 2021-04-24 LAB — LIPID PANEL
Cholesterol: 117 mg/dL (ref 0–200)
HDL: 44 mg/dL (ref >40)
LDL Cholesterol: 62 mg/dL (ref 0–99)
Total CHOL/HDL Ratio: 2.7 RATIO
Triglycerides: 53 mg/dL (ref <150)
VLDL: 11 mg/dL (ref 0–40)

## 2021-04-24 NOTE — H&P (View-Only) (Signed)
° ° °Primary Care Physician: Jessup, Joy, NP °Primary Cardiologist: Dr Crenshaw °Primary Electrophysiologist: Dr Klein °Referring Physician: Dr Crenshaw ° ° °Dale Rivera is a 60 y.o. male with a history of hypertrophic CM, HTN, HLD, atrial fibrillation who presents for follow up in the [facility]. The patient was initially diagnosed with atrial fibrillation 04/24/20 after presenting to Dr Crenshaw's office for a routine follow up. Patient was asymptomatic. Patient is on Eliquis for a CHADS2VASC score of 1. He reports that his afib is paroxysmal. He has a Kardia device which he checks daily. At his visit 12/18/20 he had been in afib for several days and did have symptoms of increased fatigue. Of note, he has recently been diagnosed with OSA and is waiting for a CPAP machine.  ° °On follow up today, patient reports that he has been in persistent afib for the past 3-4 months, not long after his last visit. He does note dyspnea on exertion. His Kardia has shown only rate controlled afib. He denies any missed doses of anticoagulation.  ° °Today, he denies symptoms of palpitations, chest pain, orthopnea, PND, lower extremity edema, dizziness, presyncope, syncope, bleeding, or neurologic sequela. The patient is tolerating medications without difficulties and is otherwise without complaint today.  ° ° °Atrial Fibrillation Risk Factors: ° °he does have symptoms or diagnosis of sleep apnea. °he is is compliant with CPAP. °he does not have a history of rheumatic fever. °he does not have a history of alcohol use. °The patient does have a history of early familial atrial fibrillation or other arrhythmias. Mother ° °he has a BMI of Body mass index is 31.85 kg/m².. °Filed Weights  ° 04/24/21 0824  °Weight: 100.7 kg  ° ° °Family History  °Problem Relation Age of Onset  ° Hypertension Mother   ° Heart failure Mother   ° Heart attack Father   ° Hypertension Sister   ° Coronary artery disease Other   °  Diabetes Other   °     siblings  ° ° ° °Atrial Fibrillation Management history: ° °Previous antiarrhythmic drugs: none °Previous cardioversions: none °Previous ablations: none °CHADS2VASC score: 1 °Anticoagulation history: Eliquis ° ° °Past Medical History:  °Diagnosis Date  ° Atrial fibrillation (HCC)   ° Cardiomyopathy   ° hypertrophic  ° Gout   ° HYPERLIPIDEMIA 07/02/2009  ° Qualifier: Diagnosis of  By: Crenshaw, MD, FACC, Christino Saunders   ° Hypertension   ° Hypertrophic obstructive cardiomyopathy (HCC) 07/03/2009  ° Qualifier: Diagnosis of  By: Crenshaw, MD, FACC, Lunden Saunders   ° Lower extremity edema 11/15/2013  ° Palpitations 09/18/2009  ° Qualifier: Diagnosis of  By: Crenshaw, MD, FACC, Chozen Saunders   ° SYNCOPE 07/02/2009  ° Qualifier: Diagnosis of  By: Crenshaw, MD, FACC, Mervil Saunders   ° °Past Surgical History:  °Procedure Laterality Date  ° CERVICAL DISC SURGERY    ° TONSILLECTOMY    ° ° °Current Outpatient Medications  °Medication Sig Dispense Refill  ° acetaminophen (TYLENOL) 650 MG CR tablet Take 650 mg by mouth every 8 (eight) hours as needed for pain.    ° allopurinol (ZYLOPRIM) 100 MG tablet Take 1 tablet (100 mg total) by mouth 2 (two) times daily. TAKE ONE TABLET TWICE DAILY 180 tablet 3  ° AMBULATORY NON FORMULARY MEDICATION Continuous positive airway pressure (CPAP) machine set on AutoPAP (4-20 cmH2O), with all supplemental supplies as needed. 1 each 0  ° amLODipine (NORVASC) 5 MG tablet TAKE 1 TABLET (5 MG TOTAL)   BY MOUTH DAILY. 30 tablet 11  ° apixaban (ELIQUIS) 5 MG TABS tablet Take 1 tablet (5 mg total) by mouth 2 (two) times daily. 180 tablet 1  ° atorvastatin (LIPITOR) 20 MG tablet Take 1 tablet (20 mg total) by mouth daily. (Patient taking differently: Take 20 mg by mouth in the morning and at bedtime.) 90 tablet 3  ° busPIRone (BUSPAR) 5 MG tablet Take 1-3 tablets (5-15 mg total) by mouth 2 (two) times daily as needed. 60 tablet 0  ° metoprolol succinate (TOPROL-XL) 25 MG 24 hr  tablet TAKE 3 TABLETS BY MOUTH  DAILY WITH OR IMMEDIATELY  FOLLOWING A MEAL 270 tablet 0  ° sildenafil (REVATIO) 20 MG tablet Take 1-5 tablets (20-100 mg total) by mouth as needed. 50 tablet 11  ° °No current facility-administered medications for this encounter.  ° ° °No Known Allergies ° °Social History  ° °Socioeconomic History  ° Marital status: Married  °  Spouse name: Not on file  ° Number of children: Not on file  ° Years of education: Not on file  ° Highest education level: Not on file  °Occupational History  ° Not on file  °Tobacco Use  ° Smoking status: Never  ° Smokeless tobacco: Never  ° Tobacco comments:  °  Never smoke (12/24/2020)  °Vaping Use  ° Vaping Use: Never used  °Substance and Sexual Activity  ° Alcohol use: Yes  °  Alcohol/week: 2.0 standard drinks  °  Types: 2 Cans of beer per week  °  Comment: 2 beers once a week 04/24/2021  ° Drug use: Never  ° Sexual activity: Yes  °Other Topics Concern  ° Not on file  °Social History Narrative  ° Full time.   ° °Social Determinants of Health  ° °Financial Resource Strain: Not on file  °Food Insecurity: Not on file  °Transportation Needs: Not on file  °Physical Activity: Not on file  °Stress: Not on file  °Social Connections: Not on file  °Intimate Partner Violence: Not on file  ° ° ° °ROS- All systems are reviewed and negative except as per the HPI above. ° °Physical Exam: °Vitals:  ° 04/24/21 0824  °BP: 134/88  °Pulse: 75  °Weight: 100.7 kg  °Height: 5' 10" (1.778 m)  ° °GEN- The patient is a well appearing obese male, alert and oriented x 3 today.   °HEENT-head normocephalic, atraumatic, sclera clear, conjunctiva pink, hearing intact, trachea midline. °Lungs- Clear to ausculation bilaterally, normal work of breathing °Heart- irregular rate and rhythm, no murmurs, rubs or gallops  °GI- soft, NT, ND, + BS °Extremities- no clubbing, cyanosis, or edema °MS- no significant deformity or atrophy °Skin- no rash or lesion °Psych- euthymic mood, full  affect °Neuro- strength and sensation are intact ° ° °Wt Readings from Last 3 Encounters:  °04/24/21 100.7 kg  °04/02/21 101.5 kg  °03/10/21 101.6 kg  ° ° °EKG today demonstrates  °Afib, LVH °Vent. rate 75 BPM °PR interval * ms °QRS duration 88 ms °QT/QTcB 436/486 ms ° °Echo 05/20/20 demonstrated  °1. Left ventricular ejection fraction, by estimation, is 55 to 60%. The  °left ventricle has normal function. The left ventricle has no regional  °wall motion abnormalities. There is severe concentric left ventricular  °hypertrophy. Increased LVOT gradient 32  mmHG with valsalva. Intracavitary obliteration is noted. No systolic anterior motion. Left ventricular diastolic parameters are indeterminate.  ° 2. Right ventricular systolic function is normal. The right ventricular  °size is normal.  ° 3.   Left atrial size was mildly dilated.   4. The mitral valve is normal in structure. No evidence of mitral valve regurgitation. No evidence of mitral stenosis.   5. The aortic valve is normal in structure. Aortic valve regurgitation is not visualized. No aortic stenosis is present.   6. Unable to determine pulmonary artery systolic pressure, No TR jet spectral display.   Epic records are reviewed at length today  CHA2DS2-VASc Score = 1  The patient's score is based upon: CHF History: 0 HTN History: 1 Diabetes History: 0 Stroke History: 0 Vascular Disease History: 0 Age Score: 0 Gender Score: 0       ASSESSMENT AND PLAN: 1. Persistent atrial fibrillation  The patient's CHA2DS2-VASc score is 1, indicating a 0.6% annual risk of stroke.   Patient now appears to be persistent.  We again discussed therapeutic options for rhythm control including DCCV, sotalol, disopyramide, amiodarone, and ablation.  Short term, will arrange for DCCV now that he is persistent. Check cmet/cbc today. Long term, patient is interested in afib ablation for rhythm control. Will refer to ablating EP. Kardia for home monitoring.   Continue Eliquis 5 mg BID. Anticoagulation indicated given HCM even with low CV score.   2. Obesity Body mass index is 31.85 kg/m. Lifestyle modification was discussed and encouraged including regular physical activity and weight reduction.  3. Obstructive sleep apnea Patient reports compliance with CPAP therapy.  4. HCM Continue Toprol 75 mg daily  5. HTN Stable, no changes today.   Follow up in the AF clinic post DCCV. Follow up with EP for ablation consideration.    [facility] [address] [place], [place] [address] [phone] 04/24/2021 8:29 AM

## 2021-04-24 NOTE — Patient Instructions (Signed)
Cardioversion scheduled for Thursday, February 2nd  - Arrive at the [facility] and go to admitting at 730AM  - Do not eat or drink anything after midnight the night prior to your procedure.  - Take all your morning medication (except diabetic medications) with a sip of water prior to arrival.  - You will not be able to drive home after your procedure.  - Do NOT miss any doses of your blood thinner - if you should miss a dose please notify our office immediately.  - If you feel as if you go back into normal rhythm prior to scheduled cardioversion, please notify our office immediately. If your procedure is canceled in the cardioversion suite you will be charged a cancellation fee. Patients will be asked to: to mask in public and hand hygiene (no longer quarantine) in the 3 days prior to surgery, to report if any COVID-19-like illness or household contacts to COVID-19 to determine need for testing

## 2021-04-24 NOTE — Progress Notes (Signed)
Primary Care Physician: Samuel Bouche, NP Primary Cardiologist: Dr Stanford Breed Primary Electrophysiologist: Dr Caryl Comes Referring Physician: Dr Kirk Ruths Dale Rivera is a 61 y.o. male with a history of hypertrophic CM, HTN, HLD, atrial fibrillation who presents for follow up in the [facility]. The patient was initially diagnosed with atrial fibrillation 04/24/20 after presenting to Dr Jacalyn Lefevre office for a routine follow up. Patient was asymptomatic. Patient is on Eliquis for a CHADS2VASC score of 1. He reports that his afib is paroxysmal. He has a Investment banker, operational device which he checks daily. At his visit 12/18/20 he had been in afib for several days and did have symptoms of increased fatigue. Of note, he has recently been diagnosed with OSA and is waiting for a CPAP machine.   On follow up today, patient reports that he has been in persistent afib for the past 3-4 months, not long after his last visit. He does note dyspnea on exertion. His Dale Rivera has shown only rate controlled afib. He denies any missed doses of anticoagulation.   Today, he denies symptoms of palpitations, chest pain, orthopnea, PND, lower extremity edema, dizziness, presyncope, syncope, bleeding, or neurologic sequela. The patient is tolerating medications without difficulties and is otherwise without complaint today.    Atrial Fibrillation Risk Factors:  he does have symptoms or diagnosis of sleep apnea. he is is compliant with CPAP. he does not have a history of rheumatic fever. he does not have a history of alcohol use. The patient does have a history of early familial atrial fibrillation or other arrhythmias. Mother  he has a BMI of Body mass index is 31.85 kg/m.Marland Kitchen Filed Weights   04/24/21 0824  Weight: 100.7 kg    Family History  Problem Relation Age of Onset   Hypertension Mother    Heart failure Mother    Heart attack Father    Hypertension Sister    Coronary artery disease Other     Diabetes Other        siblings     Atrial Fibrillation Management history:  Previous antiarrhythmic drugs: none Previous cardioversions: none Previous ablations: none CHADS2VASC score: 1 Anticoagulation history: Eliquis   Past Medical History:  Diagnosis Date   Atrial fibrillation ([place])    Cardiomyopathy    hypertrophic   Gout    HYPERLIPIDEMIA 07/02/2009   Qualifier: Diagnosis of  By: Stanford Breed, MD, Kandyce Rud    Hypertension    Hypertrophic obstructive cardiomyopathy ([place]) 07/03/2009   Qualifier: Diagnosis of  By: Stanford Breed, MD, Kandyce Rud    Lower extremity edema 11/15/2013   Palpitations 09/18/2009   Qualifier: Diagnosis of  By: Stanford Breed, MD, FACC, [place] 07/02/2009   Qualifier: Diagnosis of  By: Stanford Breed, MD, Kandyce Rud    Past Surgical History:  Procedure Laterality Date   CERVICAL DISC SURGERY     TONSILLECTOMY      Current Outpatient Medications  Medication Sig Dispense Refill   acetaminophen (TYLENOL) 650 MG CR tablet Take 650 mg by mouth every 8 (eight) hours as needed for pain.     allopurinol (ZYLOPRIM) 100 MG tablet Take 1 tablet (100 mg total) by mouth 2 (two) times daily. TAKE ONE TABLET TWICE DAILY 180 tablet 3   AMBULATORY NON FORMULARY MEDICATION Continuous positive airway pressure (CPAP) machine set on AutoPAP (4-20 cmH2O), with all supplemental supplies as needed. 1 each 0   amLODipine (NORVASC) 5 MG tablet TAKE 1 TABLET (5 MG TOTAL)  BY MOUTH DAILY. 30 tablet 11   apixaban (ELIQUIS) 5 MG TABS tablet Take 1 tablet (5 mg total) by mouth 2 (two) times daily. 180 tablet 1   atorvastatin (LIPITOR) 20 MG tablet Take 1 tablet (20 mg total) by mouth daily. (Patient taking differently: Take 20 mg by mouth in the morning and at bedtime.) 90 tablet 3   busPIRone (BUSPAR) 5 MG tablet Take 1-3 tablets (5-15 mg total) by mouth 2 (two) times daily as needed. 60 tablet 0   metoprolol succinate (TOPROL-XL) 25 MG 24 hr  tablet TAKE 3 TABLETS BY MOUTH  DAILY WITH OR IMMEDIATELY  FOLLOWING A MEAL 270 tablet 0   sildenafil (REVATIO) 20 MG tablet Take 1-5 tablets (20-100 mg total) by mouth as needed. 50 tablet 11   No current facility-administered medications for this encounter.    No Known Allergies  Social History   Socioeconomic History   Marital status: Married    Spouse name: Not on file   Number of children: Not on file   Years of education: Not on file   Highest education level: Not on file  Occupational History   Not on file  Tobacco Use   Smoking status: Never   Smokeless tobacco: Never   Tobacco comments:    Never smoke (12/24/2020)  Vaping Use   Vaping Use: Never used  Substance and Sexual Activity   Alcohol use: Yes    Alcohol/week: 2.0 standard drinks    Types: 2 Cans of beer per week    Comment: 2 beers once a week 04/24/2021   Drug use: Never   Sexual activity: Yes  Other Topics Concern   Not on file  Social History Narrative   Full time.    Social Determinants of Health   Financial Resource Strain: Not on file  Food Insecurity: Not on file  Transportation Needs: Not on file  Physical Activity: Not on file  Stress: Not on file  Social Connections: Not on file  Intimate Partner Violence: Not on file     ROS- All systems are reviewed and negative except as per the HPI above.  Physical Exam: Vitals:   04/24/21 0824  BP: 134/88  Pulse: 75  Weight: 100.7 kg  Height: 5\' 10"  (1.778 m)   GEN- The patient is a well appearing obese male, alert and oriented x 3 today.   HEENT-head normocephalic, atraumatic, sclera clear, conjunctiva pink, hearing intact, trachea midline. Lungs- Clear to ausculation bilaterally, normal work of breathing Heart- irregular rate and rhythm, no murmurs, rubs or gallops  GI- soft, NT, ND, + BS Extremities- no clubbing, cyanosis, or edema MS- no significant deformity or atrophy Skin- no rash or lesion Psych- euthymic mood, full  affect Neuro- strength and sensation are intact   Wt Readings from Last 3 Encounters:  04/24/21 100.7 kg  04/02/21 101.5 kg  03/10/21 101.6 kg    EKG today demonstrates  Afib, LVH Vent. rate 75 BPM PR interval * ms QRS duration 88 ms QT/QTcB 436/486 ms  Echo 05/20/20 demonstrated  1. Left ventricular ejection fraction, by estimation, is 55 to 60%. The  left ventricle has normal function. The left ventricle has no regional  wall motion abnormalities. There is severe concentric left ventricular  hypertrophy. Increased LVOT gradient 32  mmHG with valsalva. Intracavitary obliteration is noted. No systolic anterior motion. Left ventricular diastolic parameters are indeterminate.   2. Right ventricular systolic function is normal. The right ventricular  size is normal.   3.  Left atrial size was mildly dilated.   4. The mitral valve is normal in structure. No evidence of mitral valve regurgitation. No evidence of mitral stenosis.   5. The aortic valve is normal in structure. Aortic valve regurgitation is not visualized. No aortic stenosis is present.   6. Unable to determine pulmonary artery systolic pressure, No TR jet spectral display.   Epic records are reviewed at length today  CHA2DS2-VASc Score = 1  The patient's score is based upon: CHF History: 0 HTN History: 1 Diabetes History: 0 Stroke History: 0 Vascular Disease History: 0 Age Score: 0 Gender Score: 0       ASSESSMENT AND PLAN: 1. Persistent atrial fibrillation  The patient's CHA2DS2-VASc score is 1, indicating a 0.6% annual risk of stroke.   Patient now appears to be persistent.  We again discussed therapeutic options for rhythm control including DCCV, sotalol, disopyramide, amiodarone, and ablation.  Short term, will arrange for DCCV now that he is persistent. Check cmet/cbc today. Long term, patient is interested in afib ablation for rhythm control. Will refer to ablating EP. Kardia for home monitoring.   Continue Eliquis 5 mg BID. Anticoagulation indicated given HCM even with low CV score.   2. Obesity Body mass index is 31.85 kg/m. Lifestyle modification was discussed and encouraged including regular physical activity and weight reduction.  3. Obstructive sleep apnea Patient reports compliance with CPAP therapy.  4. HCM Continue Toprol 75 mg daily  5. HTN Stable, no changes today.   Follow up in the AF clinic post DCCV. Follow up with EP for ablation consideration.    [facility] [address] [place], [place] [address] [phone] 04/24/2021 8:29 AM

## 2021-04-28 ENCOUNTER — Ambulatory Visit: Payer: PRIVATE HEALTH INSURANCE | Admitting: Gastroenterology

## 2021-04-30 ENCOUNTER — Encounter (HOSPITAL_COMMUNITY): Payer: Self-pay | Admitting: Cardiovascular Disease

## 2021-04-30 NOTE — Progress Notes (Signed)
Attempted to obtain medical history via telephone, unable to reach at this time. I left a voicemail to return pre surgical testing department's phone call.  

## 2021-05-01 ENCOUNTER — Encounter: Payer: Self-pay | Admitting: Cardiology

## 2021-05-01 DIAGNOSIS — E7800 Pure hypercholesterolemia, unspecified: Secondary | ICD-10-CM

## 2021-05-08 ENCOUNTER — Ambulatory Visit (HOSPITAL_COMMUNITY)
Admission: RE | Admit: 2021-05-08 | Discharge: 2021-05-08 | Disposition: A | Discharge status: Home / Self Care | Payer: PRIVATE HEALTH INSURANCE | Attending: Cardiovascular Disease | Admitting: Cardiovascular Disease

## 2021-05-08 ENCOUNTER — Ambulatory Visit (HOSPITAL_COMMUNITY): Payer: PRIVATE HEALTH INSURANCE | Admitting: Anesthesiology

## 2021-05-08 ENCOUNTER — Other Ambulatory Visit: Payer: Self-pay

## 2021-05-08 ENCOUNTER — Encounter (HOSPITAL_COMMUNITY): Payer: Self-pay | Admitting: Cardiovascular Disease

## 2021-05-08 ENCOUNTER — Encounter (HOSPITAL_COMMUNITY)
Admission: RE | Disposition: A | Discharge status: Home / Self Care | Payer: Self-pay | Source: Home / Self Care | Attending: Cardiovascular Disease

## 2021-05-08 DIAGNOSIS — Z7901 Long term (current) use of anticoagulants: Secondary | ICD-10-CM | POA: Diagnosis not present

## 2021-05-08 DIAGNOSIS — I422 Other hypertrophic cardiomyopathy: Secondary | ICD-10-CM | POA: Insufficient documentation

## 2021-05-08 DIAGNOSIS — I4819 Other persistent atrial fibrillation: Secondary | ICD-10-CM | POA: Insufficient documentation

## 2021-05-08 DIAGNOSIS — E669 Obesity, unspecified: Secondary | ICD-10-CM | POA: Insufficient documentation

## 2021-05-08 DIAGNOSIS — Z6831 Body mass index (BMI) 31.0-31.9, adult: Secondary | ICD-10-CM | POA: Diagnosis not present

## 2021-05-08 DIAGNOSIS — I10 Essential (primary) hypertension: Secondary | ICD-10-CM | POA: Diagnosis not present

## 2021-05-08 DIAGNOSIS — E785 Hyperlipidemia, unspecified: Secondary | ICD-10-CM | POA: Diagnosis not present

## 2021-05-08 DIAGNOSIS — F419 Anxiety disorder, unspecified: Secondary | ICD-10-CM | POA: Diagnosis not present

## 2021-05-08 DIAGNOSIS — G4733 Obstructive sleep apnea (adult) (pediatric): Secondary | ICD-10-CM | POA: Insufficient documentation

## 2021-05-08 HISTORY — PX: CARDIOVERSION: SHX1299

## 2021-05-08 SURGERY — CARDIOVERSION
Anesthesia: General

## 2021-05-08 MED ORDER — PROPOFOL 10 MG/ML IV BOLUS
INTRAVENOUS | Status: DC | PRN
  Administered 2021-05-08: 60 mg via INTRAVENOUS

## 2021-05-08 MED ORDER — LIDOCAINE 2% (20 MG/ML) 5 ML SYRINGE
INTRAMUSCULAR | Status: DC | PRN
  Administered 2021-05-08: 40 mg via INTRAVENOUS

## 2021-05-08 MED ORDER — SODIUM CHLORIDE 0.9 % IV SOLN: INTRAVENOUS | Status: DC

## 2021-05-08 NOTE — Anesthesia Procedure Notes (Signed)
Procedure Name: General with mask airway Date/Time: 05/08/2021 8:16 AM Performed by: Tressia Miners, CRNA Pre-anesthesia Checklist: Patient identified, Emergency Drugs available, Suction available and Patient being monitored Patient Re-evaluated:Patient Re-evaluated prior to induction Oxygen Delivery Method: Ambu bag Preoxygenation: Pre-oxygenation with 100% oxygen Induction Type: IV induction

## 2021-05-08 NOTE — Anesthesia Preprocedure Evaluation (Addendum)
Anesthesia Evaluation  Patient identified by MRN, date of birth, ID band Patient awake    Reviewed: Allergy & Precautions, NPO status , Patient's Chart, lab work & pertinent test results  Airway Mallampati: I  TM Distance: >3 FB Neck ROM: Full    Dental  (+) Teeth Intact   Pulmonary neg pulmonary ROS,    breath sounds clear to auscultation       Cardiovascular hypertension, Pt. on medications and Pt. on home beta blockers + dysrhythmias  Rhythm:Irregular Rate:Normal  Echo: 1. Left ventricular ejection fraction, by estimation, is 55 to 60%. The  left ventricle has normal function. The left ventricle has no regional  wall motion abnormalities. There is severe concentric left ventricular  hypertrophy. Increased LVOT gradient 32  mmHG with valsalva. Intracavitary obliteration is noted. No systolic  anterior motion. Left ventricular diastolic parameters are indeterminate.  2. Right ventricular systolic function is normal. The right ventricular  size is normal.  3. Left atrial size was mildly dilated.  4. The mitral valve is normal in structure. No evidence of mitral valve  regurgitation. No evidence of mitral stenosis.  5. The aortic valve is normal in structure. Aortic valve regurgitation is  not visualized. No aortic stenosis is present.  6. Unable to determine pulmonary artery systolic pressure, No TR jet  spectral display.    Neuro/Psych Anxiety negative neurological ROS     GI/Hepatic negative GI ROS, Neg liver ROS,   Endo/Other  negative endocrine ROS  Renal/GU negative Renal ROS     Musculoskeletal negative musculoskeletal ROS (+)   Abdominal Normal abdominal exam  (+)   Peds  Hematology negative hematology ROS (+)   Anesthesia Other Findings   Reproductive/Obstetrics                            Anesthesia Physical Anesthesia Plan  ASA: 3  Anesthesia Plan: General    Post-op Pain Management:    Induction: Intravenous  PONV Risk Score and Plan: 0 and Propofol infusion  Airway Management Planned: Natural Airway and Simple Face Mask  Additional Equipment: None  Intra-op Plan:   Post-operative Plan:   Informed Consent: I have reviewed the patients History and Physical, chart, labs and discussed the procedure including the risks, benefits and alternatives for the proposed anesthesia with the patient or authorized representative who has indicated his/her understanding and acceptance.       Plan Discussed with: CRNA  Anesthesia Plan Comments:        Anesthesia Quick Evaluation

## 2021-05-08 NOTE — Transfer of Care (Signed)
Immediate Anesthesia Transfer of Care Note  Patient: Dale Rivera  Procedure(s) Performed: CARDIOVERSION  Patient Location: Endoscopy Unit  Anesthesia Type:General  Level of Consciousness: awake and alert   Airway & Oxygen Therapy: Patient Spontanous Breathing  Post-op Assessment: Report given to RN and Post -op Vital signs reviewed and stable  Post vital signs: Reviewed and stable  Last Vitals:  Vitals Value Taken Time  BP 135/84 05/08/21 08:24  Temp    Pulse 67 05/08/21 08:24  Resp 20 05/08/21 08:24  SpO2 100 05/08/21 08:24    Last Pain:  Vitals:   05/08/21 0729  TempSrc: Temporal  PainSc: 0-No pain      Patients Stated Pain Goal: 8 (05/08/21 0729)  Complications: No notable events documented.

## 2021-05-08 NOTE — Discharge Instructions (Signed)

## 2021-05-08 NOTE — Anesthesia Postprocedure Evaluation (Signed)
Anesthesia Post Note  Patient: Dale Rivera  Procedure(s) Performed: CARDIOVERSION     Patient location during evaluation: PACU Anesthesia Type: General Level of consciousness: awake and alert Pain management: pain level controlled Vital Signs Assessment: post-procedure vital signs reviewed and stable Respiratory status: spontaneous breathing, nonlabored ventilation, respiratory function stable and patient connected to nasal cannula oxygen Cardiovascular status: blood pressure returned to baseline and stable Postop Assessment: no apparent nausea or vomiting Anesthetic complications: no   No notable events documented.  Last Vitals:  Vitals:   05/08/21 0836 05/08/21 0845  BP: 125/84 137/82  Pulse: 61 61  Resp: 17 (!) 21  Temp:    SpO2: 96% 97%    Last Pain:  Vitals:   05/08/21 0845  TempSrc:   PainSc: 0-No pain                 Shelton Silvas

## 2021-05-08 NOTE — Interval H&P Note (Signed)
History and Physical Interval Note:  05/08/2021 8:03 AM  Dale Rivera  has presented today for surgery, with the diagnosis of AFIB.  The various methods of treatment have been discussed with the patient and family. After consideration of risks, benefits and other options for treatment, the patient has consented to  Procedure(s): CARDIOVERSION (N/A) as a surgical intervention.  The patient's history has been reviewed, patient examined, no change in status, stable for surgery.  I have reviewed the patient's chart and labs.  Questions were answered to the patient's satisfaction.     Chilton Si, MD

## 2021-05-08 NOTE — CV Procedure (Signed)
Electrical Cardioversion Procedure Note Wilver Tignor [id number] 1960/05/07  Procedure: Electrical Cardioversion Indications:  Atrial Fibrillation  Procedure Details Consent: Risks of procedure as well as the alternatives and risks of each were explained to the (patient/caregiver).  Consent for procedure obtained. Time Out: Verified patient identification, verified procedure, site/side was marked, verified correct patient position, special equipment/implants available, medications/allergies/relevent history reviewed, required imaging and test results available.  Performed  Patient placed on cardiac monitor, pulse oximetry, supplemental oxygen as necessary.  Sedation given: propofol Pacer pads placed anterior and posterior chest.  Cardioverted 1 time(s).  Cardioverted at 150J.  Evaluation Findings: Post procedure EKG shows: NSR Complications: None Patient did tolerate procedure well.   Chilton Si, MD 05/08/2021, 8:24 AM

## 2021-05-09 ENCOUNTER — Encounter (HOSPITAL_COMMUNITY): Payer: Self-pay | Admitting: Cardiovascular Disease

## 2021-05-12 MED ORDER — ATORVASTATIN CALCIUM 40 MG PO TABS: 40.0000 mg | ORAL_TABLET | Freq: Every day | ORAL | 3 refills | Status: DC

## 2021-05-15 ENCOUNTER — Ambulatory Visit (HOSPITAL_COMMUNITY): Payer: PRIVATE HEALTH INSURANCE | Admitting: Physician Assistant

## 2021-05-15 ENCOUNTER — Encounter: Payer: Self-pay | Admitting: Cardiology

## 2021-05-26 ENCOUNTER — Encounter: Payer: Self-pay | Admitting: Medical-Surgical

## 2021-05-26 ENCOUNTER — Encounter: Payer: Self-pay | Admitting: Cardiology

## 2021-05-27 ENCOUNTER — Telehealth: Payer: Self-pay

## 2021-05-27 ENCOUNTER — Other Ambulatory Visit (INDEPENDENT_AMBULATORY_CARE_PROVIDER_SITE_OTHER): Payer: PRIVATE HEALTH INSURANCE

## 2021-05-27 ENCOUNTER — Other Ambulatory Visit: Payer: Self-pay

## 2021-05-27 ENCOUNTER — Encounter: Payer: Self-pay | Admitting: Gastroenterology

## 2021-05-27 ENCOUNTER — Ambulatory Visit (INDEPENDENT_AMBULATORY_CARE_PROVIDER_SITE_OTHER): Payer: PRIVATE HEALTH INSURANCE | Admitting: Gastroenterology

## 2021-05-27 VITALS — BP 128/78 | HR 63 | Ht 70.0 in | Wt 227.2 lb

## 2021-05-27 DIAGNOSIS — R140 Abdominal distension (gaseous): Secondary | ICD-10-CM | POA: Diagnosis not present

## 2021-05-27 DIAGNOSIS — R195 Other fecal abnormalities: Secondary | ICD-10-CM

## 2021-05-27 DIAGNOSIS — Z7901 Long term (current) use of anticoagulants: Secondary | ICD-10-CM

## 2021-05-27 DIAGNOSIS — R197 Diarrhea, unspecified: Secondary | ICD-10-CM

## 2021-05-27 DIAGNOSIS — I4821 Permanent atrial fibrillation: Secondary | ICD-10-CM

## 2021-05-27 DIAGNOSIS — Z86010 Personal history of colonic polyps: Secondary | ICD-10-CM

## 2021-05-27 LAB — C-REACTIVE PROTEIN: CRP: <1 mg/dL (ref 0.5–20.0)

## 2021-05-27 LAB — SEDIMENTATION RATE: Sed Rate: 1 mm/hr (ref 0–20)

## 2021-05-27 NOTE — Telephone Encounter (Signed)
1st attempt to reach pt, left a message for pt to call back.

## 2021-05-27 NOTE — Progress Notes (Signed)
Chief Complaint: Abdominal bloating, diarrhea, change in bowel habits   Referring Provider:     Samuel Bouche, NP    HPI:     Dale Rivera is a 61 y.o. male with a history of hypertrophic cardiomyopathy, atrial fibrillation s/p cardioversion 05/2021 (on Eliquis), HTN, HLD, OSA (on CPAP), diverticulosis with prior diverticulitis, referred to [facility] for evaluation of abdominal bloating, diarrhea, change in bowel habits.  Sxs present for last several months. Worse with beer (drinks rarely), but more recently with anything he eats. Also with loose, non-bloody stools. No nocturnal stools. Has sxs at least 1 day/week, with up to 3-4 loose stools on those days. Has gained 15# over last 6 months.  No hematochezia or melena.  Last colonoscopy was 09/09/2018 at [facility] and notable for diverticulosis, internal hemorrhoids, with recommendation to repeat in 5 years due to prior history of polyps.  Biopsies negative for MC.  Patient reports a history of colon polyps 5 years prior (2015).  Last seen at [facility] in 11/2019 for evaluation of diarrhea and abdominal pain.  CT in 11/2019 with moderate diverticulitis, treated with Cipro/Flagyl. He reports having a few episodes of diverticulitis in the past, but that was the last episode.    No prior EGD.  Has f/u with Cardiology next week.   No known family history of CRC, GI malignancy, liver disease, pancreatic disease, or IBD.    CBC Latest Ref Rng & Units 04/24/2021 09/26/2020 04/24/2020  WBC 4.0 - 10.5 K/uL 7.9 8.2 8.2  Hemoglobin 13.0 - 17.0 g/dL 17.1(H) 16.6 16.8  Hematocrit 39.0 - 52.0 % 49.8 46.7 48.4  Platelets 150 - 400 K/uL 248 268 280   CMP Latest Ref Rng & Units 04/24/2021 09/26/2020 04/24/2020  Glucose 70 - 99 mg/dL 122(H) 105(H) 92  BUN 8 - 23 mg/dL _0 Creatinine 0.61 - 1.24 mg/dL 1.19 1.15 1.11  Sodium 135 - 145 mmol/L 141 142 139  Potassium 3.5 - 5.1 mmol/L 5.1 4.8 4.6  Chloride 98 - 111 mmol/L 108  106 101  CO2 22 - 32 mmol/L _1 Calcium 8.9 - 10.3 mg/dL 9.3 10.0 10.0  Total Protein 6.5 - 8.1 g/dL 6.6 6.8 -  Total Bilirubin 0.3 - 1.2 mg/dL 1.2 0.8 -  Alkaline Phos 38 - 126 U/L 69 - -  AST 15 - 41 U/L 27 20 -  ALT 0 - 44 U/L 35 26 -      Past Medical History:  Diagnosis Date   Atrial fibrillation (HCC)    Cardiomyopathy    hypertrophic   Gout    HYPERLIPIDEMIA 07/02/2009   Qualifier: Diagnosis of  By: Stanford Breed, MD, Kandyce Rud    Hypertension    Hypertrophic obstructive cardiomyopathy ([place]) 07/03/2009   Qualifier: Diagnosis of  By: Stanford Breed, MD, Kandyce Rud    Lower extremity edema 11/15/2013   Palpitations 09/18/2009   Qualifier: Diagnosis of  By: Stanford Breed, MD, FACC, [place] 07/02/2009   Qualifier: Diagnosis of  By: Stanford Breed, MD, Kandyce Rud      Past Surgical History:  Procedure Laterality Date   CARDIOVERSION N/A 05/08/2021   Procedure: CARDIOVERSION;  Surgeon: Skeet Latch, MD;  Location: [facility] ENDOSCOPY;  Service: Cardiovascular;  Laterality: N/A;   CERVICAL DISC SURGERY     COLONOSCOPY  09/09/2018   TONSILLECTOMY     Family History  Problem Relation  Age of Onset   Hypertension Mother    Heart failure Mother    Heart attack Father    Hypertension Sister    Coronary artery disease Other    Diabetes Other        siblings   Colon cancer Neg Hx    Rectal cancer Neg Hx    Stomach cancer Neg Hx    Esophageal cancer Neg Hx    Pancreatic cancer Neg Hx    Liver cancer Neg Hx    Social History   Tobacco Use   Smoking status: Never   Smokeless tobacco: Never   Tobacco comments:    Never smoke (12/24/2020)  Vaping Use   Vaping Use: Never used  Substance Use Topics   Alcohol use: Yes    Alcohol/week: 2.0 standard drinks    Types: 2 Cans of beer per week    Comment: 2 beers once a week 04/24/2021   Drug use: Never   Current Outpatient Medications  Medication Sig Dispense Refill   acetaminophen  (TYLENOL) 650 MG CR tablet Take 650 mg by mouth every 8 (eight) hours as needed for pain.     allopurinol (ZYLOPRIM) 100 MG tablet Take 1 tablet (100 mg total) by mouth 2 (two) times daily. TAKE ONE TABLET TWICE DAILY 180 tablet 3   AMBULATORY NON FORMULARY MEDICATION Continuous positive airway pressure (CPAP) machine set on AutoPAP (4-20 cmH2O), with all supplemental supplies as needed. 1 each 0   amLODipine (NORVASC) 5 MG tablet TAKE 1 TABLET (5 MG TOTAL) BY MOUTH DAILY. 30 tablet 11   apixaban (ELIQUIS) 5 MG TABS tablet Take 1 tablet (5 mg total) by mouth 2 (two) times daily. 180 tablet 1   atorvastatin (LIPITOR) 40 MG tablet Take 1 tablet (40 mg total) by mouth daily. 90 tablet 3   busPIRone (BUSPAR) 5 MG tablet Take 1-3 tablets (5-15 mg total) by mouth 2 (two) times daily as needed. 60 tablet 0   metoprolol succinate (TOPROL-XL) 25 MG 24 hr tablet TAKE 3 TABLETS BY MOUTH  DAILY WITH OR IMMEDIATELY  FOLLOWING A MEAL 270 tablet 0   sildenafil (REVATIO) 20 MG tablet Take 1-5 tablets (20-100 mg total) by mouth as needed. 50 tablet 11   No current facility-administered medications for this visit.   No Known Allergies   Review of Systems: All systems reviewed and negative except where noted in HPI.     Physical Exam:    Wt Readings from Last 3 Encounters:  05/27/21 227 lb 4 oz (103.1 kg)  05/08/21 220 lb (99.8 kg)  04/24/21 222 lb (100.7 kg)    BP 128/78    Pulse 63    Ht _0  (1.778 m)    Wt 227 lb 4 oz (103.1 kg)    SpO2 96%    BMI 32.61 kg/m  Constitutional:  Pleasant, in no acute distress. Psychiatric: Normal mood and affect. Behavior is normal. Cardiovascular: Normal rate, regular rhythm. No edema Pulmonary/chest: Effort normal and breath sounds normal. No wheezing, rales or rhonchi. Abdominal: Soft, nondistended, nontender.  Neurological: Alert and oriented to person place and time. Skin: Skin is warm and dry. No rashes noted.   ASSESSMENT AND PLAN;   1) Abdominal  bloating 2) Change in bowel habits 3) Diarrhea  - tTG IgA, total IgA - EGD wth bxs - GI PCR, calprotectin, fecal pancreatic elastase - ESR, CRP - Low FODMAP diet.  Provided with handout and detailed instructions today - If above evaluation unrevealing and  no change in symptoms, can plan for repeat colonoscopy with biopsies  4) History of colon polyps - Is otherwise up-to-date on CRC screening/polyp surveillance, with repeat due in 2025.  As above, if work-up unrevealing, plan to perform earlier for evaluation of mucosal/luminal pathology  5) Atrial fibrillation 6) Chronic anticoagulation - Hold Eliquis 2 days before procedure - will instruct when and how to resume after procedure. Low but real risk of cardiovascular event such as heart attack, stroke, embolism, thrombosis or ischemia/infarct of other organs off Eliquis explained and need to seek urgent help if this occurs. The patient consents to proceed. Will communicate by phone or EMR with patient's prescribing provider to confirm that holding Eliquis is reasonable in this case - Has follow-up in cardiology next week  7) Diverticulosis with history of diverticulitis - Symptoms do not seem c/w diverticulitis.  Last episode was 2021  The indications, risks, and benefits of EGD were explained to the patient in detail. Risks include but are not limited to bleeding, perforation, adverse reaction to medications, and cardiopulmonary compromise. Sequelae include but are not limited to the possibility of surgery, hospitalization, and mortality. The patient verbalized understanding and wished to proceed. All questions answered, referred to scheduler. Further recommendations pending results of the exam.       Lavena Bullion, DO, FACG  05/27/2021, 9:57 AM   Samuel Bouche, NP

## 2021-05-27 NOTE — Telephone Encounter (Signed)
[facility] Pre-operative Risk Assessment     Request for surgical clearance:     Endoscopy Procedure  What type of surgery is being performed?     EGD  When is this surgery scheduled?    TBA  What type of clearance is required ?   Pharmacy  Are there any medications that need to be held prior to surgery and how long? Eliquis 24 hour hold  Practice name and name of physician performing surgery?      [facility]  What is your office phone and fax number?      Phone- [phone]  [phone]  Anesthesia type (None, local, MAC, general) ?       MAC

## 2021-05-27 NOTE — Telephone Encounter (Signed)
° °  Patient Name: Dale Rivera  DOB: 1960/04/28 MRN: [id number]  Primary Cardiologist: Olga Millers, MD  Chart reviewed as part of pre-operative protocol coverage.  Patient had recent cardioversion procedure 05/08/21 but per subsequent notes has since gone back into atrial fibrillation and is pending appt with Dr. Lalla Brothers 06/02/21 to discuss next steps. At this time he may not yet hold anticoagulation - at bare minimum must hold anticoagulation for 4 weeks post DCCV, so not until after 3/3. However, we need to await plans at upcoming OV 06/02/21 before advising further on clearance for holding thereafter.  I will route this message to callback to notify patient to please mention timing of colonoscopy at upcoming OV with Dr. Lalla Brothers. Will also cc to GI team so they're aware.  Preop team will follow peripherally and review OV when available.   Laurann Montana, PA-C 05/27/2021, 2:20 PM

## 2021-05-27 NOTE — Patient Instructions (Addendum)
If you are age 63 or older, your body mass index should be between 23-30. Your Body mass index is 32.61 kg/m. If this is out of the aforementioned range listed, please consider follow up with your Primary Care Provider.  If you are age 61 or younger, your body mass index should be between 19-25. Your Body mass index is 32.61 kg/m. If this is out of the aformentioned range listed, please consider follow up with your Primary Care Provider.   ________________________________________________________  The [facility] providers would like to encourage you to use [facility] to communicate with providers for non-urgent requests or questions.  Due to long hold times on the telephone, sending your provider a message by [facility] may be a faster and more efficient way to get a response.  Please allow 48 business hours for a response.  Please remember that this is for non-urgent requests.  _______________________________________________________  Please go to the lab on the 2nd floor [address] before you leave the office today.   It has been recommended to you by your physician that you have a(n) EGD completed. Per your request, we did not schedule the procedure(s) today. Please contact our office at [phone] should you decide to have the procedure completed. You have been given blank instructions.  You will be contacted by our office prior to your procedure for directions on holding your Eliquis.  If you do not hear from our office 1 week prior to your scheduled procedure, please call [phone] to discuss.   Low FODMAP Diet: (Fermentable Oligosaccharides, Disaccharides, Monosaccharides, and Polyols) These are short chain carbohydrates and sugar alcohols that are poorly absorbed by the body, resulting in multiple abdominal symptoms, including changes in bowel habits, abdominal pain/discomfort, bloating, abdominal distension, gas, etc.

## 2021-05-28 ENCOUNTER — Other Ambulatory Visit: Payer: PRIVATE HEALTH INSURANCE

## 2021-05-28 DIAGNOSIS — R197 Diarrhea, unspecified: Secondary | ICD-10-CM

## 2021-05-28 DIAGNOSIS — R195 Other fecal abnormalities: Secondary | ICD-10-CM

## 2021-05-28 DIAGNOSIS — R140 Abdominal distension (gaseous): Secondary | ICD-10-CM

## 2021-05-28 LAB — TISSUE TRANSGLUTAMINASE, IGA: (tTG) Ab, IgA: <1 U/mL

## 2021-05-28 LAB — IGA: Immunoglobulin A: 157 mg/dL (ref 70–320)

## 2021-05-28 NOTE — Telephone Encounter (Signed)
Left message for the pt to call back and ask to s/w the pre op team so that we may go over the recommendations. Dr.

## 2021-05-28 NOTE — Telephone Encounter (Signed)
Patient was returning call 

## 2021-05-28 NOTE — Telephone Encounter (Signed)
Pt is agreeable to hold off on gi procedure as well until he has been seen by Dr. Lalla Brothers. Pt will keep his appt 06/02/21 @ 8:15 with Dr. Lalla Brothers.

## 2021-05-28 NOTE — Telephone Encounter (Signed)
Addendum: Dr. Barron Alvine, Verlin Dike, DO is agreeable to waiting until the pt has been seen by his cardiologist.

## 2021-05-30 LAB — CALPROTECTIN, FECAL: Calprotectin, Fecal: <16 ug/g (ref 0–120)

## 2021-05-31 LAB — GI PROFILE, STOOL, PCR

## 2021-06-02 ENCOUNTER — Ambulatory Visit (INDEPENDENT_AMBULATORY_CARE_PROVIDER_SITE_OTHER): Payer: PRIVATE HEALTH INSURANCE | Admitting: Cardiology

## 2021-06-02 ENCOUNTER — Encounter: Payer: Self-pay | Admitting: *Deleted

## 2021-06-02 ENCOUNTER — Other Ambulatory Visit: Payer: Self-pay

## 2021-06-02 ENCOUNTER — Encounter: Payer: Self-pay | Admitting: Cardiology

## 2021-06-02 VITALS — BP 126/76 | HR 68 | Ht 70.0 in | Wt 226.0 lb

## 2021-06-02 DIAGNOSIS — I4819 Other persistent atrial fibrillation: Secondary | ICD-10-CM | POA: Diagnosis not present

## 2021-06-02 DIAGNOSIS — I421 Obstructive hypertrophic cardiomyopathy: Secondary | ICD-10-CM | POA: Diagnosis not present

## 2021-06-02 NOTE — Patient Instructions (Addendum)
Medication Instructions:  Your physician recommends that you continue on your current medications as directed. Please refer to the Current Medication list given to you today. *If you need a refill on your cardiac medications before your next appointment, please call your pharmacy*  Lab Work: CBC If you have labs (blood work) drawn today and your tests are completely normal, you will receive your results only by: MyChart Message (if you have MyChart) OR A paper copy in the mail If you have any lab test that is abnormal or we need to change your treatment, we will call you to review the results.  Testing/Procedures: Your physician has requested that you have a cardiac MRI. Cardiac MRI uses a computer to create images of your heart as its beating, producing both still and moving pictures of your heart and major blood vessels. For further information please visit [email or web address]. Please follow the instruction sheet given to you today for more information.   Follow-Up: At [facility], you and your health needs are our priority.  As part of our continuing mission to provide you with exceptional heart care, we have created designated Provider Care Teams.  These Care Teams include your primary Cardiologist (physician) and Advanced Practice Providers (APPs -  Physician Assistants and Nurse Practitioners) who all work together to provide you with the care you need, when you need it.  Your physician wants you to follow-up in: 4 weeks with Steffanie Dunn, MD   We recommend signing up for the patient portal called "MyChart".  Sign up information is provided on this After Visit Summary.  MyChart is used to connect with patients for Virtual Visits (Telemedicine).  Patients are able to view lab/test results, encounter notes, upcoming appointments, etc.  Non-urgent messages can be sent to your provider as well.   To learn more about what you can do with MyChart, go to [email or web address].    Any  Other Special Instructions Will Be Listed Below (If Applicable).

## 2021-06-02 NOTE — Progress Notes (Signed)
Electrophysiology Office Note:    Date:  06/02/2021   ID:  Dale Rivera, DOB 03-18-1961, MRN [id number]  PCP:  Samuel Bouche, NP  [facility] Cardiologist:  Kirk Ruths, MD  [facility] Electrophysiologist:  Vickie Epley, MD   Referring MD: Samuel Bouche, NP   Chief Complaint: Atrial fibrillation  History of Present Illness:    Dale Rivera is a 61 y.o. male who presents for an evaluation of atrial fibrillation at the request of Adline Peals, . Their medical history includes atrial fibrillation, hypertrophic obstructive cardiomyopathy, hypertension, hyperlipidemia, and gout.  Overall, he appears well. He reports that he felt better for about 3 days after his cardioversion 05/08/2021. Then he was able to tell when he reverted to atrial fibrillation.  He confirms prior syncopal episodes while playing basketball. He had 3 episodes that suddenly occurred after a pause in the game. He typically felt a prodrome of dizziness for a few seconds, right after he was running heavily. Then after passing out he suffered injuries from falling to the floor each time.   Typically he walks and lifts weights for exercise. Often he enjoys golf as well. He states he can no longer run and play basketball like he could before.  He is the oldest of 6 siblings, and only 1 sister has HCM. She has not had any syncopal episodes. His daughter is healthy, has not yet been genetically screened.  He denies any chest pain, shortness of breath, or peripheral edema. No lightheadedness, headaches, orthopnea, or PND.     Past Medical History:  Diagnosis Date   Atrial fibrillation ([place])    Cardiomyopathy    hypertrophic   Gout    HYPERLIPIDEMIA 07/02/2009   Qualifier: Diagnosis of  By: Stanford Breed, MD, Kandyce Rud    Hypertension    Hypertrophic obstructive cardiomyopathy ([place]) 07/03/2009   Qualifier: Diagnosis of  By: Stanford Breed, MD, Kandyce Rud    Lower extremity edema 11/15/2013    Palpitations 09/18/2009   Qualifier: Diagnosis of  By: Stanford Breed, MD, FACC, [place] 07/02/2009   Qualifier: Diagnosis of  By: Stanford Breed, MD, Kandyce Rud     Past Surgical History:  Procedure Laterality Date   CARDIOVERSION N/A 05/08/2021   Procedure: CARDIOVERSION;  Surgeon: Skeet Latch, MD;  Location: [facility] ENDOSCOPY;  Service: Cardiovascular;  Laterality: N/A;   CERVICAL DISC SURGERY     COLONOSCOPY  09/09/2018   TONSILLECTOMY      Current Medications: Current Meds  Medication Sig   acetaminophen (TYLENOL) 650 MG CR tablet Take 650 mg by mouth every 8 (eight) hours as needed for pain.   allopurinol (ZYLOPRIM) 100 MG tablet Take 1 tablet (100 mg total) by mouth 2 (two) times daily. TAKE ONE TABLET TWICE DAILY   AMBULATORY NON FORMULARY MEDICATION Continuous positive airway pressure (CPAP) machine set on AutoPAP (4-20 cmH2O), with all supplemental supplies as needed.   amLODipine (NORVASC) 5 MG tablet TAKE 1 TABLET (5 MG TOTAL) BY MOUTH DAILY.   apixaban (ELIQUIS) 5 MG TABS tablet Take 1 tablet (5 mg total) by mouth 2 (two) times daily.   atorvastatin (LIPITOR) 40 MG tablet Take 1 tablet (40 mg total) by mouth daily.   busPIRone (BUSPAR) 5 MG tablet Take 1-3 tablets (5-15 mg total) by mouth 2 (two) times daily as needed.   metoprolol succinate (TOPROL-XL) 25 MG 24 hr tablet TAKE 3 TABLETS BY MOUTH  DAILY WITH OR IMMEDIATELY  FOLLOWING A MEAL   sildenafil (REVATIO)  20 MG tablet Take 1-5 tablets (20-100 mg total) by mouth as needed.     Allergies:   Patient has no known allergies.   Social History   Socioeconomic History   Marital status: Married    Spouse name: Not on file   Number of children: 1   Years of education: Not on file   Highest education level: Not on file  Occupational History   Not on file  Tobacco Use   Smoking status: Never   Smokeless tobacco: Never   Tobacco comments:    Never smoke (12/24/2020)  Vaping Use   Vaping Use:  Never used  Substance and Sexual Activity   Alcohol use: Yes    Alcohol/week: 2.0 standard drinks    Types: 2 Cans of beer per week    Comment: 2 beers once a week 04/24/2021   Drug use: Never   Sexual activity: Yes  Other Topics Concern   Not on file  Social History Narrative   Full time.    Social Determinants of Health   Financial Resource Strain: Not on file  Food Insecurity: Not on file  Transportation Needs: Not on file  Physical Activity: Not on file  Stress: Not on file  Social Connections: Not on file     Family History: The patient's family history includes Coronary artery disease in an other family member; Diabetes in an other family member; Heart attack in his father; Heart failure in his mother; Hypertension in his mother and sister. There is no history of Colon cancer, Rectal cancer, Stomach cancer, Esophageal cancer, Pancreatic cancer, or Liver cancer.  ROS:   Please see the history of present illness.    (+) Remote syncopal episodes All other systems reviewed and are negative.  EKGs/Labs/Other Studies Reviewed:    The following studies were reviewed today:  03/10/2021 Lexiscan Myoview:   The study is normal. The study is low risk.   No ST deviation was noted.   LV perfusion is normal. There is no evidence of ischemia. There is no evidence of infarction.   Left ventricular function is abnormal. Nuclear stress EF: 33 %. The left ventricular ejection fraction is moderately decreased (30-44%). End diastolic cavity size is normal. End systolic cavity size is normal.   Prior study available for comparison from 01/15/2017.   Normal study without evidence of ischemia or infarction.  LVEF visually is normal but was computed at 33% due to cavity obliteration. Would confirm with an echocardiogram.  This is a low-risk study based on perfusion. LVEF likely inaccurate.  05/20/2020 Echo:  1. Left ventricular ejection fraction, by estimation, is 55 to 60%. The  left  ventricle has normal function. The left ventricle has no regional  wall motion abnormalities. There is severe concentric left ventricular  hypertrophy. Increased LVOT gradient 32   mmHG with valsalva. Intracavitary obliteration is noted. No systolic  anterior motion. Left ventricular diastolic parameters are indeterminate.   2. Right ventricular systolic function is normal. The right ventricular  size is normal.   3. Left atrial size was mildly dilated.   4. The mitral valve is normal in structure. No evidence of mitral valve  regurgitation. No evidence of mitral stenosis.   5. The aortic valve is normal in structure. Aortic valve regurgitation is  not visualized. No aortic stenosis is present.   6. Unable to determine pulmonary artery systolic pressure, No TR jet  spectral display.   02/12/2021 Cardiac CT: FINDINGS: Coronary Calcium Score:   Left main:  0   Left anterior descending artery: 308   Left circumflex artery: 17.8   Right coronary artery: 113   Total: 439   Percentile: 86   Pericardium: Normal.   Ascending Aorta: Normal caliber; aortic atherosclerosis.   Non-cardiac: See separate report from [facility].   IMPRESSION: Coronary calcium score of 439. This was 23 percentile for age-, race-, and sex-matched controls.   Aortic atherosclerosis  03/14/2018 Cardiac MRI IMPRESSION: 1. This study is suggestive of a mid LV variant of hypertrophic cardiomyopathy with asymmetric hypertrophy of the basal septal segments and the mid LV segments. EF 64%. Suspect mid-cavity gradient. 2.  Normal RV size and systolic function. 3. Delayed enhancement imaging showed mid-wall LGE in the basal anteroseptum and the mid anterior and lateral walls. This is not a coronary disease pattern. Suspect this scarring is related to hypertrophic cardiomyopathy and suggests higher risk for VT.  EKG:   EKG is personally reviewed.  06/02/2021: Atrial fibrillation/flutter, ST/T wave  abnormality   Recent Labs: 09/26/2020: TSH 2.73 04/24/2021: ALT 35; BUN 13; Creatinine, Ser 1.19; Hemoglobin 17.1; Platelets 248; Potassium 5.1; Sodium 141   Recent Lipid Panel    Component Value Date/Time   CHOL 117 04/24/2021 0854   TRIG 53 04/24/2021 0854   HDL 44 04/24/2021 0854   CHOLHDL 2.7 04/24/2021 0854   VLDL 11 04/24/2021 0854   LDLCALC 62 04/24/2021 0854   LDLCALC 89 09/26/2020 0000    Physical Exam:    VS:  BP 126/76    Pulse 68    Ht 5\' 10"  (1.778 m)    Wt 226 lb (102.5 kg)    SpO2 97%    BMI 32.43 kg/m     Wt Readings from Last 3 Encounters:  06/02/21 226 lb (102.5 kg)  05/27/21 227 lb 4 oz (103.1 kg)  05/08/21 220 lb (99.8 kg)     GEN: Well nourished, well developed in no acute distress HEENT: Normal NECK: No JVD; No carotid bruits LYMPHATICS: No lymphadenopathy CARDIAC: Irregularly irregular, no murmurs, rubs, gallops RESPIRATORY:  Clear to auscultation without rales, wheezing or rhonchi  ABDOMEN: Soft, non-tender, non-distended MUSCULOSKELETAL:  No edema; No deformity  SKIN: Warm and dry NEUROLOGIC:  Alert and oriented x 3 PSYCHIATRIC:  Normal affect       ASSESSMENT:    1. Hypertrophic obstructive cardiomyopathy (HCC)   2. Persistent atrial fibrillation (HCC)    PLAN:    In order of problems listed above:  #Hypertrophic obstructive cardiomyopathy We had a long discussion today about his diagnosis of HOCM.  We discussed the pathogenesis, genetic link and risk of sudden cardiac death.  I am quite concerned about his previous MRI findings which show a substantial amount of LGE.  I did discuss his case with Dr. Gardiner Rhyme who quantified the LGE as 14% of LV mass.  I do think his history of syncope is also concerning.  Given the relationship to exertion and outflow tract obstruction noted on echo and cardiac MRI, I am concerned these episodes could reflect outflow obstruction related syncope.  Taking into consideration all of his risk factors, he has  at least a class IIb indication for ICD.  I would like to repeat his cardiac MRI to requantify his LGE and to see if there is been any progression.  I will plan to see him back after his cardiac MRI to review the results and to make a final determination about the need for defibrillator.  I have encouraged him to continue  with genetic testing with Dr. Broadus John in genetics.  We discussed how this could have implications on arrhythmic risk and could help risk stratify his children.  #Persistent atrial fibrillation On Eliquis for stroke prophylaxis.  I do think he would be a good candidate for catheter ablation.  I would like to complete the evaluation of his hypertrophic cardiomyopathy before deciding upon timing of ablation.  If/when ablation is pursued, ablation strategy would include PVI and posterior wall and CTI.  Follow-up in 4 weeks.  Total time spent with patient today 65 minutes. This includes reviewing records, evaluating the patient and coordinating care.  Medication Adjustments/Labs and Tests Ordered: Current medicines are reviewed at length with the patient today.  Concerns regarding medicines are outlined above.  Orders Placed This Encounter  Procedures   MR CARDIAC MORPHOLOGY W WO CONTRAST   CBC   EKG 12-Lead   No orders of the defined types were placed in this encounter.   I,Mathew Stumpf,acting as a Education administrator for Vickie Epley, MD.,have documented all relevant documentation on the behalf of Vickie Epley, MD,as directed by  Vickie Epley, MD while in the presence of Vickie Epley, MD.  I, Vickie Epley, MD, have reviewed all documentation for this visit. The documentation on 06/02/21 for the exam, diagnosis, procedures, and orders are all accurate and complete.   Signed, Hilton Cork. Quentin Ore, MD, [facility], [facility] 06/02/2021 8:15 PM    Electrophysiology [facility]

## 2021-06-02 NOTE — Progress Notes (Deleted)
AF dx 04/24/2020 Eliquis Fatigue  Dx of hypertrophic cardiomyopathy with mid LV hypertrophy and LGE Sister also with HCM 93mm septum   PVI Posterior wall ?Multaq?  Repeat MRI Zio for NSVT assessment ?genetics evaluation  03/14/2018 Cardiac MRI IMPRESSION: 1. This study is suggestive of a mid LV variant of hypertrophic cardiomyopathy with asymmetric hypertrophy of the basal septal segments and the mid LV segments. EF 64%. Suspect mid-cavity gradient. 2.  Normal RV size and systolic function. 3. Delayed enhancement imaging showed mid-wall LGE in the basal anteroseptum and the mid anterior and lateral walls. This is not a coronary disease pattern. Suspect this scarring is related to hypertrophic cardiomyopathy and suggests higher risk for VT.

## 2021-06-03 LAB — CBC
Hematocrit: 46.2 % (ref 37.5–51.0)
Hemoglobin: 16.9 g/dL (ref 13.0–17.7)
MCH: 32.4 pg (ref 26.6–33.0)
MCHC: 36.6 g/dL — ABNORMAL HIGH (ref 31.5–35.7)
MCV: 89 fL (ref 79–97)
Platelets: 279 10*3/uL (ref 150–450)
RBC: 5.21 x10E6/uL (ref 4.14–5.80)
RDW: 11.8 % (ref 11.6–15.4)
WBC: 11.8 10*3/uL — ABNORMAL HIGH (ref 3.4–10.8)

## 2021-06-05 ENCOUNTER — Encounter: Payer: Self-pay | Admitting: Cardiology

## 2021-06-05 LAB — PANCREATIC ELASTASE, FECAL: Pancreatic Elastase-1, Stool: >500 mcg/g

## 2021-06-17 NOTE — Progress Notes (Signed)
? ? ? ? ?HPI: FU hypertrophic cardiomyopathy. 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 I did have Dr. Caryl Comes evaluate the patient. He felt these were most likely vagal in etiology. He recommended continued beta blocker and no ICD. Holter monitor October 2018 showed sinus with PACs, PVCs and 4 beats nonsustained ventricular tachycardia. Cardiac MRI December 2019 suggestive of mid LV variant hypertrophic cardiomyopathy with asymmetric hypertrophy of the basal septal segment and mid LV segments. Ejection fraction 64%.  There was delayed enhancement involving the mid wall in the basal anteroseptum and mid anterior and lateral walls suggesting higher risk of VT. Echo repeated 2/22 showed normal LV function, severe LVH, LVOT gradient of 32 mmHg with valsalva, mild LAE. Also with atrial fibrillation. Calcium score November 2022 439.  Nuclear study December 2022 showed normal perfusion and ejection fraction 33% but visually was normal.  Patient had cardioversion February 2023 but atrial fibrillation recurred.  Patient has been seen by Dr. Quentin Ore for reconsideration of ICD.  Follow-up cardiac MRI has been ordered.  It should also be noted that he also plans atrial fibrillation ablation once decision about ICD made.  Since I last saw him, he has mild dyspnea on exertion and fatigue.  No chest pain, palpitations, syncope or bleeding. ? ?Current Outpatient Medications  ?Medication Sig Dispense Refill  ? acetaminophen (TYLENOL) 650 MG CR tablet Take 650 mg by mouth every 8 (eight) hours as needed for pain.    ? allopurinol (ZYLOPRIM) 100 MG tablet Take 1 tablet (100 mg total) by mouth 2 (two) times daily. TAKE ONE TABLET TWICE DAILY 180 tablet 3  ? AMBULATORY NON FORMULARY MEDICATION Continuous positive airway pressure (CPAP) machine set on AutoPAP (4-20 cmH2O), with all supplemental supplies as needed. 1 each 0  ? amLODipine (NORVASC) 5 MG tablet TAKE 1 TABLET (5 MG TOTAL) BY  MOUTH DAILY. 30 tablet 11  ? apixaban (ELIQUIS) 5 MG TABS tablet Take 1 tablet (5 mg total) by mouth 2 (two) times daily. 180 tablet 1  ? atorvastatin (LIPITOR) 40 MG tablet Take 1 tablet (40 mg total) by mouth daily. 90 tablet 3  ? busPIRone (BUSPAR) 5 MG tablet Take 1-3 tablets (5-15 mg total) by mouth 2 (two) times daily as needed. 60 tablet 0  ? metoprolol succinate (TOPROL-XL) 25 MG 24 hr tablet TAKE 3 TABLETS BY MOUTH  DAILY WITH OR IMMEDIATELY  FOLLOWING A MEAL 270 tablet 0  ? sildenafil (REVATIO) 20 MG tablet Take 1-5 tablets (20-100 mg total) by mouth as needed. 50 tablet 11  ? ?No current facility-administered medications for this visit.  ? ? ? ?Past Medical History:  ?Diagnosis Date  ? Atrial fibrillation ([place])   ? Cardiomyopathy   ? hypertrophic  ? Gout   ? HYPERLIPIDEMIA 07/02/2009  ? Qualifier: Diagnosis of  By: Stanford Breed, MD, Kandyce Rud   ? Hypertension   ? Hypertrophic obstructive cardiomyopathy ([place]) 07/03/2009  ? Qualifier: Diagnosis of  By: Stanford Breed, MD, Kandyce Rud   ? Lower extremity edema 11/15/2013  ? Palpitations 09/18/2009  ? Qualifier: Diagnosis of  By: Stanford Breed, MD, Kandyce Rud   ? SYNCOPE 07/02/2009  ? Qualifier: Diagnosis of  By: Stanford Breed, MD, Kandyce Rud   ? ? ?Past Surgical History:  ?Procedure Laterality Date  ? CARDIOVERSION N/A 05/08/2021  ? Procedure: CARDIOVERSION;  Surgeon: Skeet Latch, MD;  Location: [place];  Service: Cardiovascular;  Laterality: N/A;  ? CERVICAL DISC SURGERY    ?  COLONOSCOPY  09/09/2018  ? TONSILLECTOMY    ? ? ?Social History  ? ?Socioeconomic History  ? Marital status: Married  ?  Spouse name: Not on file  ? Number of children: 1  ? Years of education: Not on file  ? Highest education level: Not on file  ?Occupational History  ? Not on file  ?Tobacco Use  ? Smoking status: Never  ? Smokeless tobacco: Never  ? Tobacco comments:  ?  Never smoke (12/24/2020)  ?Vaping Use  ? Vaping Use: Never used  ?Substance and  Sexual Activity  ? Alcohol use: Yes  ?  Alcohol/week: 2.0 standard drinks  ?  Types: 2 Cans of beer per week  ?  Comment: 2 beers once a week 04/24/2021  ? Drug use: Never  ? Sexual activity: Yes  ?Other Topics Concern  ? Not on file  ?Social History Narrative  ? Full time.   ? ?Social Determinants of Health  ? ?Financial Resource Strain: Not on file  ?Food Insecurity: Not on file  ?Transportation Needs: Not on file  ?Physical Activity: Not on file  ?Stress: Not on file  ?Social Connections: Not on file  ?Intimate Partner Violence: Not on file  ? ? ?Family History  ?Problem Relation Age of Onset  ? Hypertension Mother   ? Heart failure Mother   ? Heart attack Father   ? Hypertension Sister   ? Coronary artery disease Other   ? Diabetes Other   ?     siblings  ? Colon cancer Neg Hx   ? Rectal cancer Neg Hx   ? Stomach cancer Neg Hx   ? Esophageal cancer Neg Hx   ? Pancreatic cancer Neg Hx   ? Liver cancer Neg Hx   ? ? ?ROS: no fevers or chills, productive cough, hemoptysis, dysphasia, odynophagia, melena, hematochezia, dysuria, hematuria, rash, seizure activity, orthopnea, PND, pedal edema, claudication. Remaining systems are negative. ? ?Physical Exam: ?Well-developed well-nourished in no acute distress.  ?Skin is warm and dry.  ?HEENT is normal.  ?Neck is supple.  ?Chest is clear to auscultation with normal expansion.  ?Cardiovascular exam is irregular ?Abdominal exam nontender or distended. No masses palpated. ?Extremities show no edema. ?neuro grossly intact ? ?A/P ? ?1 persistent atrial fibrillation-plan to continue Toprol and apixaban at present dose.  Patient is symptomatic with mild increased dyspnea on exertion and fatigue.  He has been seen by Dr. Quentin Ore and is felt to be a candidate for ablation once decision concerning ICD is made. ? ?2 hypertrophic cardiomyopathy-continue beta-blocker at present dose.  Follow-up MRI has been scheduled and patient will likely require ICD.  He is scheduled to follow-up  with Dr. Quentin Ore after his MRI.  We discussed the possibility of genetic testing as he does have a daughter.  He will consider.  I have also asked him to avoid vigorous exercise. ? ?3 hypertension-blood pressure controlled.  Continue present medical regimen. ? ?4 coronary artery disease-based on elevated calcium score.  We will continue statin.  No aspirin given need for apixaban.  Nuclear study showed no ischemia. ? ?5 hyperlipidemia-given elevated calcium score will increase Lipitor to 80 mg daily.  Check lipids and liver in 8 weeks. ? ?6 history of palpitations-continue beta-blocker. ? ?Kirk Ruths, MD ? ? ? ?

## 2021-06-25 ENCOUNTER — Other Ambulatory Visit: Payer: Self-pay | Admitting: Cardiology

## 2021-06-25 ENCOUNTER — Encounter: Payer: Self-pay | Admitting: Cardiology

## 2021-06-25 ENCOUNTER — Ambulatory Visit (INDEPENDENT_AMBULATORY_CARE_PROVIDER_SITE_OTHER): Payer: PRIVATE HEALTH INSURANCE | Admitting: Cardiology

## 2021-06-25 ENCOUNTER — Other Ambulatory Visit: Payer: Self-pay | Admitting: Physician Assistant

## 2021-06-25 ENCOUNTER — Other Ambulatory Visit: Payer: Self-pay

## 2021-06-25 VITALS — BP 128/76 | HR 76 | Ht 70.0 in | Wt 225.1 lb

## 2021-06-25 DIAGNOSIS — I4819 Other persistent atrial fibrillation: Secondary | ICD-10-CM

## 2021-06-25 DIAGNOSIS — I421 Obstructive hypertrophic cardiomyopathy: Secondary | ICD-10-CM

## 2021-06-25 DIAGNOSIS — I10 Essential (primary) hypertension: Secondary | ICD-10-CM

## 2021-06-25 DIAGNOSIS — E7800 Pure hypercholesterolemia, unspecified: Secondary | ICD-10-CM

## 2021-06-25 DIAGNOSIS — I2510 Atherosclerotic heart disease of native coronary artery without angina pectoris: Secondary | ICD-10-CM

## 2021-06-25 MED ORDER — AMLODIPINE BESYLATE 5 MG PO TABS: 5.0000 mg | ORAL_TABLET | Freq: Every day | ORAL | 3 refills | Status: DC

## 2021-06-25 MED ORDER — ATORVASTATIN CALCIUM 80 MG PO TABS: 80.0000 mg | ORAL_TABLET | Freq: Every day | ORAL | 3 refills | Status: DC

## 2021-06-25 NOTE — Patient Instructions (Signed)
Medication Instructions:  ? ?INCREASE ATORVASTATIN TO 80 MG ONCE DAILY=2 OF THE 40 MG TABLETS ONCE DAILY ? ?*If you need a refill on your cardiac medications before your next appointment, please call your pharmacy* ? ? ?Lab Work: ? ?Your physician recommends that you return for lab work in: 8 [facility] ? ?If you have labs (blood work) drawn today and your tests are completely normal, you will receive your results only by: ?MyChart Message (if you have MyChart) OR ?A paper copy in the mail ?If you have any lab test that is abnormal or we need to change your treatment, we will call you to review the results. ? ? ?Follow-Up: ?At [facility], you and your health needs are our priority.  As part of our continuing mission to provide you with exceptional heart care, we have created designated Provider Care Teams.  These Care Teams include your primary Cardiologist (physician) and Advanced Practice Providers (APPs -  Physician Assistants and Nurse Practitioners) who all work together to provide you with the care you need, when you need it. ? ?We recommend signing up for the patient portal called "MyChart".  Sign up information is provided on this After Visit Summary.  MyChart is used to connect with patients for Virtual Visits (Telemedicine).  Patients are able to view lab/test results, encounter notes, upcoming appointments, etc.  Non-urgent messages can be sent to your provider as well.   ?To learn more about what you can do with MyChart, go to [email or web address].   ? ?Your next appointment:   ?6 month(s) ? ?The format for your next appointment:   ?In Person ? ?Provider:   ?Olga Millers, MD  ? ?

## 2021-06-27 ENCOUNTER — Encounter: Payer: Self-pay | Admitting: Cardiology

## 2021-07-02 ENCOUNTER — Ambulatory Visit: Payer: PRIVATE HEALTH INSURANCE | Admitting: Cardiology

## 2021-07-08 ENCOUNTER — Telehealth (HOSPITAL_COMMUNITY): Payer: Self-pay | Admitting: Emergency Medicine

## 2021-07-08 NOTE — Telephone Encounter (Signed)
Reaching out to patient to offer assistance regarding upcoming cardiac imaging study; pt verbalizes understanding of appt date/time, parking situation and where to check in, pre-test NPO status and medications ordered, and verified current allergies; name and call back number provided for further questions should they arise ?Dale Alexandria RN Navigator Cardiac Imaging ?[facility] ?[phone] office ?[phone] cell ? ?Denies iv issues ?Denies claustro ?Denies metal implants ?Arrival 830 ? ?

## 2021-07-08 NOTE — Telephone Encounter (Signed)
Attempted to call patient regarding upcoming cardiac MR appointment. Left message on voicemail with name and callback number Lex Linhares RN Navigator Cardiac Imaging [facility] [phone] Office [phone] Cell  

## 2021-07-09 ENCOUNTER — Ambulatory Visit (HOSPITAL_COMMUNITY)
Admission: RE | Admit: 2021-07-09 | Discharge: 2021-07-09 | Disposition: A | Discharge status: Home / Self Care | Payer: PRIVATE HEALTH INSURANCE | Source: Ambulatory Visit | Attending: Cardiology | Admitting: Cardiology

## 2021-07-09 DIAGNOSIS — I421 Obstructive hypertrophic cardiomyopathy: Secondary | ICD-10-CM

## 2021-07-09 MED ORDER — GADOBUTROL 1 MMOL/ML IV SOLN
10.0000 mL | Freq: Once | INTRAVENOUS | Status: AC | PRN
  Administered 2021-07-09: 10 mL via INTRAVENOUS

## 2021-07-15 NOTE — Progress Notes (Unsigned)
?Electrophysiology Office Follow up Visit Note:   ? ?Date:  07/15/2021  ? ?ID:  Dhyan Rivera, DOB May 26, 1960, MRN [id number] ? ?PCP:  Samuel Bouche, NP  ?[facility] Cardiologist:  Kirk Ruths, MD  ?[facility] Electrophysiologist:  Vickie Epley, MD  ? ? ?Interval History:   ? ?Dale Rivera is a 61 y.o. male who presents for a follow up visit.  I last saw him June 02, 2021 for his diagnosis of hypertrophic obstructive cardiomyopathy and persistent atrial fibrillation.  He has a history of arrhythmic syncope and significant LGE on a cardiac MRI.  At the last appointment we decided to repeat his cardiac MRI to reassess the scar burden and LV function.  He presents today for follow-up. ? ?The patient saw Dr. Stanford Breed on June 25, 2021 in follow-up. ? ? ? ?  ? ?Past Medical History:  ?Diagnosis Date  ? Atrial fibrillation ([place])   ? Cardiomyopathy   ? hypertrophic  ? Gout   ? HYPERLIPIDEMIA 07/02/2009  ? Qualifier: Diagnosis of  By: Stanford Breed, MD, Kandyce Rud   ? Hypertension   ? Hypertrophic obstructive cardiomyopathy ([place]) 07/03/2009  ? Qualifier: Diagnosis of  By: Stanford Breed, MD, Kandyce Rud   ? Lower extremity edema 11/15/2013  ? Palpitations 09/18/2009  ? Qualifier: Diagnosis of  By: Stanford Breed, MD, Kandyce Rud   ? SYNCOPE 07/02/2009  ? Qualifier: Diagnosis of  By: Stanford Breed, MD, Kandyce Rud   ? ? ?Past Surgical History:  ?Procedure Laterality Date  ? CARDIOVERSION N/A 05/08/2021  ? Procedure: CARDIOVERSION;  Surgeon: Skeet Latch, MD;  Location: [place];  Service: Cardiovascular;  Laterality: N/A;  ? CERVICAL DISC SURGERY    ? COLONOSCOPY  09/09/2018  ? TONSILLECTOMY    ? ? ?Current Medications: ?No outpatient medications have been marked as taking for the 07/16/21 encounter (Appointment) with Vickie Epley, MD.  ?  ? ?Allergies:   Patient has no known allergies.  ? ?Social History  ? ?Socioeconomic History  ? Marital status: Married  ?  Spouse name: Not on  file  ? Number of children: 1  ? Years of education: Not on file  ? Highest education level: Not on file  ?Occupational History  ? Not on file  ?Tobacco Use  ? Smoking status: Never  ? Smokeless tobacco: Never  ? Tobacco comments:  ?  Never smoke (12/24/2020)  ?Vaping Use  ? Vaping Use: Never used  ?Substance and Sexual Activity  ? Alcohol use: Yes  ?  Alcohol/week: 2.0 standard drinks  ?  Types: 2 Cans of beer per week  ?  Comment: 2 beers once a week 04/24/2021  ? Drug use: Never  ? Sexual activity: Yes  ?Other Topics Concern  ? Not on file  ?Social History Narrative  ? Full time.   ? ?Social Determinants of Health  ? ?Financial Resource Strain: Not on file  ?Food Insecurity: Not on file  ?Transportation Needs: Not on file  ?Physical Activity: Not on file  ?Stress: Not on file  ?Social Connections: Not on file  ?  ? ?Family History: ?The patient's family history includes Coronary artery disease in an other family member; Diabetes in an other family member; Heart attack in his father; Heart failure in his mother; Hypertension in his mother and sister. There is no history of Colon cancer, Rectal cancer, Stomach cancer, Esophageal cancer, Pancreatic cancer, or Liver cancer. ? ?ROS:   ?Please see the history of present illness.    ?  All other systems reviewed and are negative. ? ?EKGs/Labs/Other Studies Reviewed:   ? ?The following studies were reviewed today: ? ?July 13, 2021 cardiac MRI ?Severe asymmetric hypertrophy up to 23 mm ?LGE 10% ? ?December 2019 cardiac MRI ?High risk for VT given LGE ?Hypertrophic cardiomyopathy ?14% LGE ? ? ? ? ?EKG:  The ekg ordered today demonstrates *** ? ?Recent Labs: ?09/26/2020: TSH 2.73 ?04/24/2021: ALT 35; BUN 13; Creatinine, Ser 1.19; Potassium 5.1; Sodium 141 ?06/02/2021: Hemoglobin 16.9; Platelets 279  ?Recent Lipid Panel ?   ?Component Value Date/Time  ? CHOL 117 04/24/2021 0854  ? TRIG 53 04/24/2021 0854  ? HDL 44 04/24/2021 0854  ? CHOLHDL 2.7 04/24/2021 0854  ? VLDL 11  04/24/2021 0854  ? [place] 62 04/24/2021 0854  ? [place] 89 09/26/2020 0000  ? ? ?Physical Exam:   ? ?VS:  There were no vitals taken for this visit.   ? ?Wt Readings from Last 3 Encounters:  ?06/25/21 225 lb 1.9 oz (102.1 kg)  ?06/02/21 226 lb (102.5 kg)  ?05/27/21 227 lb 4 oz (103.1 kg)  ?  ? ?GEN: *** Well nourished, well developed in no acute distress ?HEENT: Normal ?NECK: No JVD; No carotid bruits ?LYMPHATICS: No lymphadenopathy ?CARDIAC: ***RRR, no murmurs, rubs, gallops ?RESPIRATORY:  Clear to auscultation without rales, wheezing or rhonchi  ?ABDOMEN: Soft, non-tender, non-distended ?MUSCULOSKELETAL:  No edema; No deformity  ?SKIN: Warm and dry ?NEUROLOGIC:  Alert and oriented x 3 ?PSYCHIATRIC:  Normal affect  ? ? ? ?  ? ?ASSESSMENT:   ? ?No diagnosis found. ?PLAN:   ? ?In order of problems listed above: ? ?ICD followed by PVI 3 months later (PVI, posterior wall, CTI) ? ? ? ? ? ? ? ? ? ?Total time spent with patient today *** minutes. This includes reviewing records, evaluating the patient and coordinating care.  ? ?Medication Adjustments/Labs and Tests Ordered: ?Current medicines are reviewed at length with the patient today.  Concerns regarding medicines are outlined above.  ?No orders of the defined types were placed in this encounter. ? ?No orders of the defined types were placed in this encounter. ? ? ? ?Signed, ?Lars Mage, MD, [facility], [facility] ?07/15/2021 10:17 PM    ?Electrophysiology ?[place] ?

## 2021-07-16 ENCOUNTER — Ambulatory Visit (INDEPENDENT_AMBULATORY_CARE_PROVIDER_SITE_OTHER): Payer: PRIVATE HEALTH INSURANCE | Admitting: Cardiology

## 2021-07-16 ENCOUNTER — Encounter: Payer: Self-pay | Admitting: Cardiology

## 2021-07-16 ENCOUNTER — Encounter: Payer: Self-pay | Admitting: *Deleted

## 2021-07-16 VITALS — BP 110/80 | HR 73 | Ht 70.0 in | Wt 226.4 lb

## 2021-07-16 DIAGNOSIS — I421 Obstructive hypertrophic cardiomyopathy: Secondary | ICD-10-CM | POA: Diagnosis not present

## 2021-07-16 DIAGNOSIS — Z01818 Encounter for other preprocedural examination: Secondary | ICD-10-CM

## 2021-07-16 DIAGNOSIS — I4819 Other persistent atrial fibrillation: Secondary | ICD-10-CM

## 2021-07-16 DIAGNOSIS — I4891 Unspecified atrial fibrillation: Secondary | ICD-10-CM

## 2021-07-16 LAB — BASIC METABOLIC PANEL
BUN/Creatinine Ratio: 17 (ref 10–24)
BUN: 18 mg/dL (ref 8–27)
CO2: 28 mmol/L (ref 20–29)
Calcium: 10 mg/dL (ref 8.6–10.2)
Chloride: 105 mmol/L (ref 96–106)
Creatinine, Ser: 1.05 mg/dL (ref 0.76–1.27)
Glucose: 102 mg/dL — ABNORMAL HIGH (ref 70–99)
Potassium: 4.6 mmol/L (ref 3.5–5.2)
Sodium: 140 mmol/L (ref 134–144)
eGFR: 80 mL/min/{1.73_m2} (ref >59)

## 2021-07-16 LAB — CBC WITH DIFFERENTIAL/PLATELET
Basophils Absolute: 0 10*3/uL (ref 0.0–0.2)
Basos: 0 %
EOS (ABSOLUTE): 0.2 10*3/uL (ref 0.0–0.4)
Eos: 2 %
Hematocrit: 46.6 % (ref 37.5–51.0)
Hemoglobin: 16.3 g/dL (ref 13.0–17.7)
Lymphocytes Absolute: 2.9 10*3/uL (ref 0.7–3.1)
Lymphs: 32 %
MCH: 32.3 pg (ref 26.6–33.0)
MCHC: 35 g/dL (ref 31.5–35.7)
MCV: 93 fL (ref 79–97)
Monocytes Absolute: 1 10*3/uL — ABNORMAL HIGH (ref 0.1–0.9)
Monocytes: 12 %
Neutrophils Absolute: 4.9 10*3/uL (ref 1.4–7.0)
Neutrophils: 54 %
Platelets: 249 10*3/uL (ref 150–450)
RBC: 5.04 x10E6/uL (ref 4.14–5.80)
RDW: 13.9 % (ref 11.6–15.4)
WBC: 9 10*3/uL (ref 3.4–10.8)

## 2021-07-16 NOTE — Patient Instructions (Addendum)
Medication Instructions:  ?Your physician recommends that you continue on your current medications as directed. Please refer to the Current Medication list given to you today. ?*If you need a refill on your cardiac medications before your next appointment, please call your pharmacy* ? ?Lab Work: ?None. ?If you have labs (blood work) drawn today and your tests are completely normal, you will receive your results only by: ?MyChart Message (if you have MyChart) OR ?A paper copy in the mail ?If you have any lab test that is abnormal or we need to change your treatment, we will call you to review the results. ? ?Testing/Procedures: ?Your physician has recommended that you have a defibrillator inserted. An implantable cardioverter defibrillator (ICD) is a small device that is placed in your chest or, in rare cases, your abdomen. This device uses electrical pulses or shocks to help control life-threatening, irregular heartbeats that could lead the heart to suddenly stop beating (sudden cardiac arrest). Leads are attached to the ICD that goes into your heart. This is done in the hospital and usually requires an overnight stay. Please see the instruction sheet given to you today for more information. ? ?Your physician has requested that you have cardiac CT. Cardiac computed tomography (CT) is a painless test that uses an x-ray machine to take clear, detailed pictures of your heart. For further information please visit [email or web address] Please follow instruction sheet as given. ? ?Your physician has recommended that you have an ablation. Catheter ablation is a medical procedure used to treat some cardiac arrhythmias (irregular heartbeats). During catheter ablation, a long, thin, flexible tube is put into a blood vessel in your groin (upper thigh), or neck. This tube is called an ablation catheter. It is then guided to your heart through the blood vessel. Radio frequency waves destroy small areas of heart tissue where  abnormal heartbeats may cause an arrhythmia to start. Please see the instruction sheet given to you today. ? ? ?Follow-Up: ?At [facility], you and your health needs are our priority.  As part of our continuing mission to provide you with exceptional heart care, we have created designated Provider Care Teams.  These Care Teams include your primary Cardiologist (physician) and Advanced Practice Providers (APPs -  Physician Assistants and Nurse Practitioners) who all work together to provide you with the care you need, when you need it. ? ?Your physician wants you to follow-up in: see instruction letter for ICD. I will contact you when Ablation set up is completed. Date for Ablation is August 8 ? ?We recommend signing up for the patient portal called "MyChart".  Sign up information is provided on this After Visit Summary.  MyChart is used to connect with patients for Virtual Visits (Telemedicine).  Patients are able to view lab/test results, encounter notes, upcoming appointments, etc.  Non-urgent messages can be sent to your provider as well.   ?To learn more about what you can do with MyChart, go to [email or web address].   ? ?Any Other Special Instructions Will Be Listed Below (If Applicable). ? ?Cardioverter Defibrillator Implantation ?An implantable cardioverter defibrillator (ICD) is a device that identifies and corrects abnormal heart rhythms. Cardioverter defibrillator implantation is a surgery to place an ICD under the skin in the chest or abdomen. An ICD has a battery, a small computer (pulse generator), and wires (leads) that go into the heart. The ICD detects and corrects two types of dangerous irregular heart rhythms (arrhythmias): ?A rapid heart rhythm in the lower chambers of the heart (ventricles). This  is called ventricular tachycardia. ?The ventricles contracting in an uncoordinated way. This is called ventricular fibrillation. ?There are different types of ICDs, and the electrical signals from  the ICD can be programmed differently based on the condition being treated. The electrical signals from the ICD can be low-energy pulses, high-energy shocks, or a combination of the two. The low-energy pulses are generally used to restore the heartbeat to normal when it is either too slow (bradycardia) or too fast. These pulses are painless. The high-energy shocks are used to treat abnormal rhythms such as ventricular tachycardia or ventricular fibrillation. This shock may feel like a strong jolt in the chest. ?Your health care provider may recommend an ICD if you have: ?Had a ventricular arrhythmia in the past. ?A damaged heart because of a disease or heart condition. ?A weakened heart muscle from a heart attack or cardiac arrest. ?A congenital heart defect. ?Long QT syndrome, which is a disorder of the heart's electrical system. ?Brugada syndrome, which is a condition that causes a disruption of the heart's normal rhythm. ?Tell a health care provider about: ?Any allergies you have. ?All medicines you are taking, including vitamins, herbs, eye drops, creams, and over-the-counter medicines. ?Any problems you or family members have had with anesthetic medicines. ?Any blood disorders you have. ?Any surgeries you have had. ?Any medical conditions you have. ?Whether you are pregnant or may be pregnant. ?What are the risks? ?Generally, this is a safe procedure. However, problems may occur, including: ?Infection. ?Bleeding. ?Allergic reactions to medicines used during the procedure. ?Blood clots. ?Swelling or bruising. ?Damage to nearby structures or organs, such as nerves, lungs, blood vessels, or the heart where the ICD leads or pulse generator is implanted. ?What happens before the procedure? ?Staying hydrated ?Follow instructions from your health care provider about hydration, which may include: ?Up to 2 hours before the procedure - you may continue to drink clear liquids, such as water, clear fruit juice, black  coffee, and plain tea. ? ?Eating and drinking restrictions ?Follow instructions from your health care provider about eating and drinking, which may include: ?8 hours before the procedure - stop eating heavy meals or foods, such as meat, fried foods, or fatty foods. ?6 hours before the procedure - stop eating light meals or foods, such as toast or cereal. ?6 hours before the procedure - stop drinking milk or drinks that contain milk. ?2 hours before the procedure - stop drinking clear liquids. ?Medicines ?Ask your health care provider about: ?Changing or stopping your regular medicines. This is especially important if you are taking diabetes medicines or blood thinners. ?Taking medicines such as aspirin and ibuprofen. These medicines can thin your blood. Do not take these medicines unless your health care provider tells you to take them. ?Taking over-the-counter medicines, vitamins, herbs, and supplements. ?Tests ?You may have an exam or testing. These may include: ?Blood tests. ?A test to check the electrical signals in your heart (electrocardiogram, ECG). ?Imaging tests, such as a chest X-ray. ?Echocardiogram. This is an ultrasound of your heart to evaluate your heart structures and function. ?An event monitor or Holter monitor to wear at home. ?General instructions ?Do not use any products that contain nicotine or tobacco for at least 4 weeks before the procedure. These products include cigarettes, chewing tobacco, and vaping devices, such as e-cigarettes. If you need help quitting, ask your health care provider. ?Ask your health care provider: ?How your procedure site will be marked. ?What steps will be taken to help prevent infection.  These may include: ?Removing hair at the surgery site. ?Washing skin with a germ-killing soap. ?Taking antibiotic medicine. ?You may be asked to shower with a germ-killing soap. ?Plan to have a responsible adult take you home from the hospital or clinic. ?What happens during the  procedure? ? ?Small monitors will be put on your body. They will be used to check your heart rate, blood pressure, and oxygen level. ?A pair of sticky pads (defibrillator pads) may be placed on your back and ches

## 2021-07-16 NOTE — Progress Notes (Signed)
?Electrophysiology Office Follow up Visit Note:   ? ?Date:  07/16/2021  ? ?ID:  Dale Rivera, DOB 02/19/1961, MRN [id number] ? ?PCP:  Jessup, Joy, NP  ?[facility] Cardiologist:  Corwyn Crenshaw, MD  ?[facility] Electrophysiologist:  Krisalyn Yankowski T Pakou Rainbow, MD  ? ? ?Interval History:   ? ?Dale Rivera is a 61 y.o. male who presents for a follow up visit.  I last saw him June 02, 2021 for his diagnosis of hypertrophic obstructive cardiomyopathy and persistent atrial fibrillation.  He has a history of arrhythmic syncope and significant LGE on a cardiac MRI.  At the last appointment we decided to repeat his cardiac MRI to reassess the scar burden and LV function.  He presents today for follow-up. ? ?The patient saw Dr. Crenshaw on June 25, 2021 in follow-up. He was noted to be symptomatic with mild increased DOE and fatigue. He was instructed to avoid vigorous exercise. They also discussed the possibility of genetic testing for hypertrophic cardiomyopathy because he has a daughter. Given his elevated calcium score, Lipitor was increased to 80 mg daily. ? ?Today, he is feeling good overall. We reviewed in detail his indications and procedural details for an ICD. ? ?Usually his heart rate is stable per his own monitoring, averaging 65-75 bpm on a given day. ? ?During his previous syncopal episodes he could usually feel a prodrome. ? 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 ? ?He denies any palpitations, chest pain, shortness of breath, or peripheral edema. No lightheadedness, headaches, orthopnea, or PND. ? ?In his family, his sister also has hypertrophic cardiomyopathy. ? ?  ? ?Past Medical History:  ?Diagnosis Date  ? Atrial fibrillation ([facility])   ? Cardiomyopathy   ? hypertrophic  ? Gout   ? HYPERLIPIDEMIA 07/02/2009  ? Qualifier: Diagnosis of  By: Crenshaw, MD, FACC, Alireza Saunders   ? Hypertension   ? Hypertrophic obstructive cardiomyopathy ([facility]) 07/03/2009  ? Qualifier:  Diagnosis of  By: Crenshaw, MD, FACC, Anik Saunders   ? Lower extremity edema 11/15/2013  ? Palpitations 09/18/2009  ? Qualifier: Diagnosis of  By: Crenshaw, MD, FACC, Rashan Saunders   ? SYNCOPE 07/02/2009  ? Qualifier: Diagnosis of  By: Crenshaw, MD, FACC, Donis Saunders   ? ? ?Past Surgical History:  ?Procedure Laterality Date  ? CARDIOVERSION N/A 05/08/2021  ? Procedure: CARDIOVERSION;  Surgeon: Wanamingo, Tiffany, MD;  Location: [facility];  Service: Cardiovascular;  Laterality: N/A;  ? CERVICAL DISC SURGERY    ? COLONOSCOPY  09/09/2018  ? TONSILLECTOMY    ? ? ?Current Medications: ?Current Meds  ?Medication Sig  ? acetaminophen (TYLENOL) 650 MG CR tablet Take 650 mg by mouth every 8 (eight) hours as needed for pain.  ? allopurinol (ZYLOPRIM) 100 MG tablet Take 1 tablet (100 mg total) by mouth 2 (two) times daily. TAKE ONE TABLET TWICE DAILY  ? AMBULATORY NON FORMULARY MEDICATION Continuous positive airway pressure (CPAP) machine set on AutoPAP (4-20 cmH2O), with all supplemental supplies as needed.  ? amLODipine (NORVASC) 5 MG tablet Take 1 tablet (5 mg total) by mouth daily.  ? atorvastatin (LIPITOR) 80 MG tablet Take 1 tablet (80 mg total) by mouth daily.  ? busPIRone (BUSPAR) 5 MG tablet Take 1-3 tablets (5-15 mg total) by mouth 2 (two) times daily as needed.  ? ELIQUIS 5 MG TABS tablet TAKE 1 TABLET BY MOUTH  TWICE DAILY  ? metoprolol succinate (TOPROL-XL) 25 MG 24 hr tablet TAKE 3 TABLETS BY MOUTH DAILY    WITH OR IMMEDIATELY FOLLOWING A  MEAL  ? sildenafil (REVATIO) 20 MG tablet Take 1-5 tablets (20-100 mg total) by mouth as needed.  ?  ? ?Allergies:   Patient has no known allergies.  ? ?Social History  ? ?Socioeconomic History  ? Marital status: Married  ?  Spouse name: Not on file  ? Number of children: 1  ? Years of education: Not on file  ? Highest education level: Not on file  ?Occupational History  ? Not on file  ?Tobacco Use  ? Smoking status: Never  ? Smokeless tobacco: Never  ? Tobacco comments:   ?  Never smoke (12/24/2020)  ?Vaping Use  ? Vaping Use: Never used  ?Substance and Sexual Activity  ? Alcohol use: Yes  ?  Alcohol/week: 2.0 standard drinks  ?  Types: 2 Cans of beer per week  ?  Comment: 2 beers once a week 04/24/2021  ? Drug use: Never  ? Sexual activity: Yes  ?Other Topics Concern  ? Not on file  ?Social History Narrative  ? Full time.   ? ?Social Determinants of Health  ? ?Financial Resource Strain: Not on file  ?Food Insecurity: Not on file  ?Transportation Needs: Not on file  ?Physical Activity: Not on file  ?Stress: Not on file  ?Social Connections: Not on file  ?  ? ?Family History: ?The patient's family history includes Coronary artery disease in an other family member; Diabetes in an other family member; Heart attack in his father; Heart failure in his mother; Hypertension in his mother and sister. There is no history of Colon cancer, Rectal cancer, Stomach cancer, Esophageal cancer, Pancreatic cancer, or Liver cancer. ? ?ROS:   ?Please see the history of present illness.    ?All other systems reviewed and are negative. ? ?EKGs/Labs/Other Studies Reviewed:   ? ?The following studies were reviewed today: ? ?July 13, 2021 cardiac MRI ?Severe asymmetric hypertrophy up to 23 mm ?LGE 10% ? ?March 10, 2021 Lexiscan Myoview ?  The study is normal. The study is low risk. ?  No ST deviation was noted. ?  LV perfusion is normal. There is no evidence of ischemia. There is no evidence of infarction. ?  Left ventricular function is abnormal. Nuclear stress EF: 33 %. The left ventricular ejection fraction is moderately decreased (30-44%). End diastolic cavity size is normal. End systolic cavity size is normal. ?  Prior study available for comparison from 01/15/2017. ?  ?Normal study without evidence of ischemia or infarction.  ?LVEF visually is normal but was computed at 33% due to cavity obliteration. Would confirm with an echocardiogram.  ?This is a low-risk study based on perfusion. LVEF likely  inaccurate. ? ?February 12, 2021 Coronary Calcium Score  ?FINDINGS: ?Coronary Calcium Score: ?  ?Left main: 0 ?  ?Left anterior descending artery: 308 ?  ?Left circumflex artery: 17.8 ?  ?Right coronary artery: 113 ?  ?Total: 439 ?  ?Percentile: 86 ?  ?Pericardium: Normal. ?  ?Ascending Aorta: Normal caliber; aortic atherosclerosis. ?  ?Non-cardiac: See separate report from [facility]. ?  ?IMPRESSION: ?Coronary calcium score of 439. This was 86 percentile for age-, ?race-, and sex-matched controls. ?  ?Aortic atherosclerosis ? ?May 20, 2020 Echo ? 1. Left ventricular ejection fraction, by estimation, is 55 to 60%. The  ?left ventricle has normal function. The left ventricle has no regional  ?wall motion abnormalities. There is severe concentric left ventricular  ?hypertrophy. Increased LVOT gradient 32  ? mmHG with valsalva.   Intracavitary obliteration is noted. No systolic  ?anterior motion. Left ventricular diastolic parameters are indeterminate.  ? 2. Right ventricular systolic function is normal. The right ventricular  ?size is normal.  ? 3. Left atrial size was mildly dilated.  ? 4. The mitral valve is normal in structure. No evidence of mitral valve  ?regurgitation. No evidence of mitral stenosis.  ? 5. The aortic valve is normal in structure. Aortic valve regurgitation is  ?not visualized. No aortic stenosis is present.  ? 6. Unable to determine pulmonary artery systolic pressure, No TR jet  ?spectral display.  ? ?December 2019 cardiac MRI ?High risk for VT given LGE ?Hypertrophic cardiomyopathy ?14% LGE ? ? ? ? ?EKG:  EKG is personally reviewed. ?07/16/2021: Atrial fibrillation, LVH ? ?Recent Labs: ?09/26/2020: TSH 2.73 ?04/24/2021: ALT 35; BUN 13; Creatinine, Ser 1.19; Potassium 5.1; Sodium 141 ?06/02/2021: Hemoglobin 16.9; Platelets 279  ? ?Recent Lipid Panel ?   ?Component Value Date/Time  ? CHOL 117 04/24/2021 0854  ? TRIG 53 04/24/2021 0854  ? HDL 44 04/24/2021 0854  ? CHOLHDL 2.7 04/24/2021  0854  ? VLDL 11 04/24/2021 0854  ? LDLCALC 62 04/24/2021 0854  ? LDLCALC 89 09/26/2020 0000  ? ? ?Physical Exam:   ? ?VS:  BP 110/80   Pulse 73   Ht 5' 10" (1.778 m)   Wt 226 lb 6.4 oz (102.7 kg)   SpO2

## 2021-07-16 NOTE — Telephone Encounter (Signed)
Routing to [facility] to contact patient regarding scheduling of this procedure as he has upcoming ICD implant.  ?

## 2021-07-16 NOTE — H&P (View-Only) (Signed)
?Electrophysiology Office Follow up Visit Note:   ? ?Date:  07/16/2021  ? ?ID:  Dale Rivera, DOB 05-03-1960, MRN [id number] ? ?PCP:  Samuel Bouche, NP  ?[facility] Cardiologist:  Kirk Ruths, MD  ?[facility] Electrophysiologist:  Vickie Epley, MD  ? ? ?Interval History:   ? ?Dale Rivera is a 61 y.o. male who presents for a follow up visit.  I last saw him June 02, 2021 for his diagnosis of hypertrophic obstructive cardiomyopathy and persistent atrial fibrillation.  He has a history of arrhythmic syncope and significant LGE on a cardiac MRI.  At the last appointment we decided to repeat his cardiac MRI to reassess the scar burden and LV function.  He presents today for follow-up. ? ?The patient saw Dr. Stanford Breed on June 25, 2021 in follow-up. He was noted to be symptomatic with mild increased DOE and fatigue. He was instructed to avoid vigorous exercise. They also discussed the possibility of genetic testing for hypertrophic cardiomyopathy because he has a daughter. Given his elevated calcium score, Lipitor was increased to 80 mg daily. ? ?Today, he is feeling good overall. We reviewed in detail his indications and procedural details for an ICD. ? ?Usually his heart rate is stable per his own monitoring, averaging 65-75 bpm on a given day. ? ?During his previous syncopal episodes he could usually feel a prodrome. ? ?He works as a Government social research officer, and is considering retirement this coming September. ? ?He denies any palpitations, chest pain, shortness of breath, or peripheral edema. No lightheadedness, headaches, orthopnea, or PND. ? ?In his family, his sister also has hypertrophic cardiomyopathy. ? ?  ? ?Past Medical History:  ?Diagnosis Date  ? Atrial fibrillation ([place])   ? Cardiomyopathy   ? hypertrophic  ? Gout   ? HYPERLIPIDEMIA 07/02/2009  ? Qualifier: Diagnosis of  By: Stanford Breed, MD, Kandyce Rud   ? Hypertension   ? Hypertrophic obstructive cardiomyopathy ([place]) 07/03/2009  ? Qualifier:  Diagnosis of  By: Stanford Breed, MD, Kandyce Rud   ? Lower extremity edema 11/15/2013  ? Palpitations 09/18/2009  ? Qualifier: Diagnosis of  By: Stanford Breed, MD, Kandyce Rud   ? SYNCOPE 07/02/2009  ? Qualifier: Diagnosis of  By: Stanford Breed, MD, Kandyce Rud   ? ? ?Past Surgical History:  ?Procedure Laterality Date  ? CARDIOVERSION N/A 05/08/2021  ? Procedure: CARDIOVERSION;  Surgeon: Skeet Latch, MD;  Location: [place];  Service: Cardiovascular;  Laterality: N/A;  ? CERVICAL DISC SURGERY    ? COLONOSCOPY  09/09/2018  ? TONSILLECTOMY    ? ? ?Current Medications: ?Current Meds  ?Medication Sig  ? acetaminophen (TYLENOL) 650 MG CR tablet Take 650 mg by mouth every 8 (eight) hours as needed for pain.  ? allopurinol (ZYLOPRIM) 100 MG tablet Take 1 tablet (100 mg total) by mouth 2 (two) times daily. TAKE ONE TABLET TWICE DAILY  ? AMBULATORY NON FORMULARY MEDICATION Continuous positive airway pressure (CPAP) machine set on AutoPAP (4-20 cmH2O), with all supplemental supplies as needed.  ? amLODipine (NORVASC) 5 MG tablet Take 1 tablet (5 mg total) by mouth daily.  ? atorvastatin (LIPITOR) 80 MG tablet Take 1 tablet (80 mg total) by mouth daily.  ? busPIRone (BUSPAR) 5 MG tablet Take 1-3 tablets (5-15 mg total) by mouth 2 (two) times daily as needed.  ? ELIQUIS 5 MG TABS tablet TAKE 1 TABLET BY MOUTH  TWICE DAILY  ? metoprolol succinate (TOPROL-XL) 25 MG 24 hr tablet TAKE 3 TABLETS BY MOUTH DAILY  WITH OR IMMEDIATELY FOLLOWING A  MEAL  ? sildenafil (REVATIO) 20 MG tablet Take 1-5 tablets (20-100 mg total) by mouth as needed.  ?  ? ?Allergies:   Patient has no known allergies.  ? ?Social History  ? ?Socioeconomic History  ? Marital status: Married  ?  Spouse name: Not on file  ? Number of children: 1  ? Years of education: Not on file  ? Highest education level: Not on file  ?Occupational History  ? Not on file  ?Tobacco Use  ? Smoking status: Never  ? Smokeless tobacco: Never  ? Tobacco comments:   ?  Never smoke (12/24/2020)  ?Vaping Use  ? Vaping Use: Never used  ?Substance and Sexual Activity  ? Alcohol use: Yes  ?  Alcohol/week: 2.0 standard drinks  ?  Types: 2 Cans of beer per week  ?  Comment: 2 beers once a week 04/24/2021  ? Drug use: Never  ? Sexual activity: Yes  ?Other Topics Concern  ? Not on file  ?Social History Narrative  ? Full time.   ? ?Social Determinants of Health  ? ?Financial Resource Strain: Not on file  ?Food Insecurity: Not on file  ?Transportation Needs: Not on file  ?Physical Activity: Not on file  ?Stress: Not on file  ?Social Connections: Not on file  ?  ? ?Family History: ?The patient's family history includes Coronary artery disease in an other family member; Diabetes in an other family member; Heart attack in his father; Heart failure in his mother; Hypertension in his mother and sister. There is no history of Colon cancer, Rectal cancer, Stomach cancer, Esophageal cancer, Pancreatic cancer, or Liver cancer. ? ?ROS:   ?Please see the history of present illness.    ?All other systems reviewed and are negative. ? ?EKGs/Labs/Other Studies Reviewed:   ? ?The following studies were reviewed today: ? ?July 13, 2021 cardiac MRI ?Severe asymmetric hypertrophy up to 23 mm ?LGE 10% ? ?March 10, 2021 Lexiscan Myoview ?  The study is normal. The study is low risk. ?  No ST deviation was noted. ?  LV perfusion is normal. There is no evidence of ischemia. There is no evidence of infarction. ?  Left ventricular function is abnormal. Nuclear stress EF: 33 %. The left ventricular ejection fraction is moderately decreased (30-44%). End diastolic cavity size is normal. End systolic cavity size is normal. ?  Prior study available for comparison from 01/15/2017. ?  ?Normal study without evidence of ischemia or infarction.  ?LVEF visually is normal but was computed at 33% due to cavity obliteration. Would confirm with an echocardiogram.  ?This is a low-risk study based on perfusion. LVEF likely  inaccurate. ? ?February 12, 2021 Coronary Calcium Score  ?FINDINGS: ?Coronary Calcium Score: ?  ?Left main: 0 ?  ?Left anterior descending artery: 308 ?  ?Left circumflex artery: 17.8 ?  ?Right coronary artery: 113 ?  ?Total: 439 ?  ?Percentile: 86 ?  ?Pericardium: Normal. ?  ?Ascending Aorta: Normal caliber; aortic atherosclerosis. ?  ?Non-cardiac: See separate report from [facility]. ?  ?IMPRESSION: ?Coronary calcium score of 439. This was 82 percentile for age-, ?race-, and sex-matched controls. ?  ?Aortic atherosclerosis ? ?May 20, 2020 Echo ? 1. Left ventricular ejection fraction, by estimation, is 55 to 60%. The  ?left ventricle has normal function. The left ventricle has no regional  ?wall motion abnormalities. There is severe concentric left ventricular  ?hypertrophy. Increased LVOT gradient 32  ? mmHG with valsalva.  Intracavitary obliteration is noted. No systolic  ?anterior motion. Left ventricular diastolic parameters are indeterminate.  ? 2. Right ventricular systolic function is normal. The right ventricular  ?size is normal.  ? 3. Left atrial size was mildly dilated.  ? 4. The mitral valve is normal in structure. No evidence of mitral valve  ?regurgitation. No evidence of mitral stenosis.  ? 5. The aortic valve is normal in structure. Aortic valve regurgitation is  ?not visualized. No aortic stenosis is present.  ? 6. Unable to determine pulmonary artery systolic pressure, No TR jet  ?spectral display.  ? ?December 2019 cardiac MRI ?High risk for VT given LGE ?Hypertrophic cardiomyopathy ?14% LGE ? ? ? ? ?EKG:  EKG is personally reviewed. ?07/16/2021: Atrial fibrillation, LVH ? ?Recent Labs: ?09/26/2020: TSH 2.73 ?04/24/2021: ALT 35; BUN 13; Creatinine, Ser 1.19; Potassium 5.1; Sodium 141 ?06/02/2021: Hemoglobin 16.9; Platelets 279  ? ?Recent Lipid Panel ?   ?Component Value Date/Time  ? CHOL 117 04/24/2021 0854  ? TRIG 53 04/24/2021 0854  ? HDL 44 04/24/2021 0854  ? CHOLHDL 2.7 04/24/2021  0854  ? VLDL 11 04/24/2021 0854  ? [place] 62 04/24/2021 0854  ? [place] 89 09/26/2020 0000  ? ? ?Physical Exam:   ? ?VS:  BP 110/80   Pulse 73   Ht 5\' 10"  (1.778 m)   Wt 226 lb 6.4 oz (102.7 kg)   SpO2

## 2021-07-17 NOTE — Telephone Encounter (Addendum)
Patient made aware we will hold off from procedure until after his other procedures. We will reach our in September/October to see how he is. Reminder sent to self ?

## 2021-07-17 NOTE — Telephone Encounter (Signed)
Dr Bryan Lemma, ? ?For this patient he has an upcoming ICD implant on 08-05-2021 and then a Afib ablation on 11-11-2021. How would you like to proceed with an EGD for this patient? ?

## 2021-07-17 NOTE — Telephone Encounter (Signed)
His cardiology procedures take precedence.  Plan to schedule EGD/colonoscopy for sometime after ablation and when cleared from a Cardiology standpoint after the ablation. ?

## 2021-07-17 NOTE — Telephone Encounter (Signed)
I do not know the reason for these upcoming GI procedures, so I think the gastroenterologist needs to review and discuss with the patient. Obviously, timing may be tricky with the upcoming cardiac procedures.  ?

## 2021-07-17 NOTE — Telephone Encounter (Signed)
Im sorry to bother you but I just want to make sure that since patient has upcoming ICD implant 5-2 and his Afib ablation 8-8 should we wait until after to see about his procedure? ?

## 2021-07-31 ENCOUNTER — Telehealth: Payer: Self-pay | Admitting: *Deleted

## 2021-07-31 NOTE — Telephone Encounter (Signed)
Spoke to patient and notified new arrival time for procedure May 2 at 8:30 am. Patient verbalized understanding and agreement.  ?

## 2021-08-04 NOTE — Pre-Procedure Instructions (Signed)
Attempted to call patient regarding procedure instructions.  Left voice mail on the following items: ?Arrival time 0800 ?Nothing to eat or drink after midnight ?No meds AM of procedure ?Responsible person to drive you home and stay with you for 24 hrs ?Wash with special soap night before and morning of procedure ?If on anti-coagulant drug instructions Eliquis- last dose 4/29  ?

## 2021-08-05 ENCOUNTER — Ambulatory Visit (HOSPITAL_COMMUNITY): Payer: PRIVATE HEALTH INSURANCE

## 2021-08-05 ENCOUNTER — Encounter (HOSPITAL_COMMUNITY)
Admission: RE | Disposition: A | Discharge status: Home / Self Care | Payer: Self-pay | Source: Home / Self Care | Attending: Cardiology

## 2021-08-05 ENCOUNTER — Other Ambulatory Visit: Payer: Self-pay

## 2021-08-05 ENCOUNTER — Ambulatory Visit (HOSPITAL_COMMUNITY)
Admission: RE | Admit: 2021-08-05 | Discharge: 2021-08-05 | Disposition: A | Discharge status: Home / Self Care | Payer: PRIVATE HEALTH INSURANCE | Attending: Cardiology | Admitting: Cardiology

## 2021-08-05 DIAGNOSIS — I4819 Other persistent atrial fibrillation: Secondary | ICD-10-CM | POA: Diagnosis not present

## 2021-08-05 DIAGNOSIS — Z7901 Long term (current) use of anticoagulants: Secondary | ICD-10-CM | POA: Diagnosis not present

## 2021-08-05 DIAGNOSIS — I421 Obstructive hypertrophic cardiomyopathy: Secondary | ICD-10-CM | POA: Diagnosis not present

## 2021-08-05 HISTORY — PX: ICD IMPLANT: EP1208

## 2021-08-05 SURGERY — ICD IMPLANT

## 2021-08-05 MED ORDER — FENTANYL CITRATE (PF) 100 MCG/2ML IJ SOLN
INTRAMUSCULAR | Status: AC
  Filled 2021-08-05: qty 2

## 2021-08-05 MED ORDER — ACETAMINOPHEN 325 MG PO TABS: 325.0000 mg | ORAL_TABLET | ORAL | Status: DC | PRN

## 2021-08-05 MED ORDER — GENTAMICIN SULFATE 40 MG/ML IJ SOLN
INTRAMUSCULAR | Status: AC
  Filled 2021-08-05: qty 2

## 2021-08-05 MED ORDER — MIDAZOLAM HCL 5 MG/5ML IJ SOLN
INTRAMUSCULAR | Status: AC
  Filled 2021-08-05: qty 5

## 2021-08-05 MED ORDER — SODIUM CHLORIDE 0.9 % IV SOLN
80.0000 mg | INTRAVENOUS | Status: AC
  Administered 2021-08-05: 80 mg

## 2021-08-05 MED ORDER — LIDOCAINE HCL (PF) 1 % IJ SOLN
INTRAMUSCULAR | Status: DC | PRN
  Administered 2021-08-05: 60 mL

## 2021-08-05 MED ORDER — FENTANYL CITRATE (PF) 100 MCG/2ML IJ SOLN
INTRAMUSCULAR | Status: DC | PRN
Start: 2021-08-05 — End: 2021-08-05
  Administered 2021-08-05 (×2): 25 ug via INTRAVENOUS

## 2021-08-05 MED ORDER — ONDANSETRON HCL 4 MG/2ML IJ SOLN: 4.0000 mg | Freq: Four times a day (QID) | INTRAMUSCULAR | Status: DC | PRN

## 2021-08-05 MED ORDER — HEPARIN (PORCINE) IN NACL 1000-0.9 UT/500ML-% IV SOLN
INTRAVENOUS | Status: AC
  Filled 2021-08-05: qty 500

## 2021-08-05 MED ORDER — POVIDONE-IODINE 10 % EX SWAB
2.0000 "application " | Freq: Once | CUTANEOUS | Status: AC
  Administered 2021-08-05: 2 via TOPICAL

## 2021-08-05 MED ORDER — MIDAZOLAM HCL 5 MG/5ML IJ SOLN
INTRAMUSCULAR | Status: DC | PRN
  Administered 2021-08-05 (×2): 1 mg via INTRAVENOUS

## 2021-08-05 MED ORDER — LIDOCAINE HCL (PF) 1 % IJ SOLN
INTRAMUSCULAR | Status: AC
  Filled 2021-08-05: qty 60

## 2021-08-05 MED ORDER — SODIUM CHLORIDE 0.9 % IV SOLN: INTRAVENOUS | Status: DC

## 2021-08-05 MED ORDER — HEPARIN (PORCINE) IN NACL 1000-0.9 UT/500ML-% IV SOLN
INTRAVENOUS | Status: DC | PRN
  Administered 2021-08-05: 500 mL

## 2021-08-05 MED ORDER — CEFAZOLIN SODIUM-DEXTROSE 2-4 GM/100ML-% IV SOLN
2.0000 g | INTRAVENOUS | Status: AC
  Administered 2021-08-05: 2 g via INTRAVENOUS

## 2021-08-05 MED ORDER — SODIUM CHLORIDE 0.9 % IV SOLN
INTRAVENOUS | Status: AC
  Filled 2021-08-05: qty 2

## 2021-08-05 MED ORDER — CHLORHEXIDINE GLUCONATE 4 % EX LIQD: 4.0000 "application " | Freq: Once | CUTANEOUS | Status: DC

## 2021-08-05 MED ORDER — APIXABAN 5 MG PO TABS
5.0000 mg | ORAL_TABLET | Freq: Two times a day (BID) | ORAL | 3 refills | Status: DC
Start: 2021-08-10 — End: 2022-06-30

## 2021-08-05 MED ORDER — CEFAZOLIN SODIUM-DEXTROSE 2-4 GM/100ML-% IV SOLN
INTRAVENOUS | Status: AC
  Filled 2021-08-05: qty 100

## 2021-08-05 SURGICAL SUPPLY — 6 items
CABLE SURGICAL S-101-97-12 (CABLE) ×2 IMPLANT
ICD ACTICOR DX (ICD Generator) ×1 IMPLANT
LEAD PLEXA 65/15 (Lead) ×1 IMPLANT
PAD DEFIB RADIO PHYSIO CONN (PAD) ×2 IMPLANT
SHEATH 8FR PRELUDE SNAP 13 (SHEATH) ×1 IMPLANT
TRAY PACEMAKER INSERTION (PACKS) ×2 IMPLANT

## 2021-08-05 NOTE — Interval H&P Note (Signed)
History and Physical Interval Note: ? ?08/05/2021 ?2:30 PM ? ?Dale Rivera  has presented today for surgery, with the diagnosis of cardiomyopathy.  The various methods of treatment have been discussed with the patient and family. After consideration of risks, benefits and other options for treatment, the patient has consented to  Procedure(s): ?ICD IMPLANT (N/A) as a surgical intervention.  The patient's history has been reviewed, patient examined, no change in status, stable for surgery.  I have reviewed the patient's chart and labs.  Questions were answered to the patient's satisfaction.   ? ? ?Dale Rivera T Dale Rivera ? ? ?

## 2021-08-05 NOTE — Discharge Instructions (Signed)
After Your ICD ?(Implantable Cardiac Defibrillator) ? ? ?You have a Biotronik ICD ? ?ACTIVITY ?Do not lift your arm above shoulder height for 1 week after your procedure. After 7 days, you may progress as below.  ?You should remove your sling 24 hours after your procedure, unless otherwise instructed by your provider.  ? ? ? Tuesday Aug 12, 2021  Wednesday Aug 13, 2021 Thursday Aug 14, 2021 Friday Aug 15, 2021  ? ?Do not lift, push, pull, or carry anything over 10 pounds with the affected arm until 6 weeks (Tuesday September 16, 2021 ) after your procedure.  ? ?You may drive AFTER your wound check, unless you have been told otherwise by your provider.  ? ?Ask your healthcare provider when you can go back to work ? ? ?INCISION/Dressing ?If you are on a blood thinner such as Coumadin, Xarelto, Eliquis, Plavix, or Pradaxa please confirm with your provider when this should be resumed.  ? ?If large square, outer bandage is left in place, this can be removed after 24 hours from your procedure. Do not remove steri-strips or glue as below.  ? ?Monitor your defibrillator site for redness, swelling, and drainage. Call the device clinic at [phone] if you experience these symptoms or fever/chills. ? ?If your incision is sealed with Steri-strips or staples, you may shower 10 days after your procedure or when told by your provider. Do not remove the steri-strips or let the shower hit directly on your site. You may wash around your site with soap and water.   ? ?If you were discharged in a sling, please do not wear this during the day more than 48 hours after your surgery unless otherwise instructed. This may increase the risk of stiffness and soreness in your shoulder.  ? ?Avoid lotions, ointments, or perfumes over your incision until it is well-healed. ? ?You may use a hot tub or a pool AFTER your wound check appointment if the incision is completely closed. ? ?Your ICD is designed to protect you from life threatening heart  rhythms. Because of this, you may receive a shock.  ? ?1 shock with no symptoms:  Call the office during business hours. ?1 shock with symptoms (chest pain, chest pressure, dizziness, lightheadedness, shortness of breath, overall feeling unwell):  Call 911. ?If you experience 2 or more shocks in 24 hours:  Call 911. ?If you receive a shock, you should not drive for 6 months per the [place] DMV IF you receive appropriate therapy from your ICD.  ? ?ICD Alerts:  Some alerts are vibratory and others beep. These are NOT emergencies. Please call our office to let us know. If this occurs at night or on weekends, it can wait until the next business day. Send a remote transmission. ? ?If your device is capable of reading fluid status (for heart failure), you will be offered monthly monitoring to review this with you.  ? ?DEVICE MANAGEMENT ?Remote monitoring is used to monitor your ICD from home. This monitoring is scheduled every 91 days by our office. It allows [place] to keep an eye on the functioning of your device to ensure it is working properly. You will routinely see your Electrophysiologist annually (more often if necessary).  ? ?You should receive your ID card for your new device in 4-8 weeks. Keep this card with you at all times once received. Consider wearing a medical alert bracelet or necklace. ? ?Your ICD  may be MRI compatible. This will be discussed at your next  office visit/wound check.  You should avoid contact with strong electric or magnetic fields.  ? ?Do not use amateur (ham) radio equipment or electric (arc) welding torches. MP3 player headphones with magnets should not be used. Some devices are safe to use if held at least 12 inches (30 cm) from your defibrillator. These include power tools, lawn mowers, and speakers. If you are unsure if something is safe to use, ask your health care provider. ? ?When using your cell phone, hold it to the ear that is on the opposite side from the defibrillator. Do not leave  your cell phone in a pocket over the defibrillator. ? ?You may safely use electric blankets, heating pads, computers, and microwave ovens. ? ?Call the office right away if: ?You have chest pain. ?You feel more than one shock. ?You feel more short of breath than you have felt before. ?You feel more light-headed than you have felt before. ?Your incision starts to open up. ? ?This information is not intended to replace advice given to you by your health care provider. Make sure you discuss any questions you have with your health care provider. ? ? ?  ?

## 2021-08-05 NOTE — Progress Notes (Signed)
Lalla Brothers, MD at bedside to evaluate patient and stated that patient is normally between afib and aflutter. Patient ok from his standpoint.  ?

## 2021-08-05 NOTE — Progress Notes (Signed)
Janey Greaser, MD to notify him of patient CXR results and he stated that patient could be discharged now despite 2000 D/C order.  ?

## 2021-08-05 NOTE — Progress Notes (Signed)
Patient returned to Procedural Short Stay in Atrial Flutter. Patient is asymptomatic and VSS. Lalla Brothers, MD paged X2. Awaiting call back. Will continue to monitor.  ?

## 2021-08-06 ENCOUNTER — Telehealth: Payer: Self-pay

## 2021-08-06 ENCOUNTER — Encounter (HOSPITAL_COMMUNITY): Payer: Self-pay | Admitting: Cardiology

## 2021-08-06 NOTE — Telephone Encounter (Signed)
Follow-up after same day discharge: ?Implant date: 08/05/21 ?MD: Lars Mage, MD ?Device: Biotronik VDD ICD ?Location: Left Chest ? ? ?Wound check visit: 08/20/21 at 11:20 ?90 day MD follow-up: 11/10/21 at 2:00 ? ?Remote Transmission received:yes ? ?Dressing/sling removed: yes ? ?Confirm [place] restart on: Pt to resume eliquis on 08/10/2021, Sunday with am dose.   ? ?Successful telephone encounter to patient to follow up post ICD implant. Patient states he is doing very well. Minimal soreness. Steristrips remain intact. Denies swelling or bleeding from site. Appointments confirmed as indicated above. Patient verbalizes he will resume his eliquis on 08/10/21 with the morning dose. He is provided device clinic contact at [phone] for additional questions or concerns that may arise. Patient appreciative of call.  ?

## 2021-08-06 NOTE — Telephone Encounter (Signed)
-----   Message from Graciella Freer, PA-C sent at 08/05/2021  3:20 PM EDT ----- ?Same day ICD CL 08/05/2021 ? ?Pt to resume eliquis on 08/10/2021, Sunday with am dose.  ? ?

## 2021-08-20 ENCOUNTER — Ambulatory Visit (INDEPENDENT_AMBULATORY_CARE_PROVIDER_SITE_OTHER): Payer: PRIVATE HEALTH INSURANCE

## 2021-08-20 DIAGNOSIS — I421 Obstructive hypertrophic cardiomyopathy: Secondary | ICD-10-CM | POA: Diagnosis not present

## 2021-08-20 LAB — CUP PACEART INCLINIC DEVICE CHECK
Date Time Interrogation Session: 20230517185037
Implantable Lead Implant Date: 20230502
Implantable Lead Location: 753860
Implantable Lead Model: 436909
Implantable Lead Serial Number: 81487767
Implantable Pulse Generator Implant Date: 20230502
Pulse Gen Model: 429525
Pulse Gen Serial Number: 84895165

## 2021-08-20 NOTE — Progress Notes (Signed)
Wound check appointment s/p ICD implant for ICM. Steri-strips removed. Wound without redness or edema. Incision edges approximated, wound well healed. Normal device function. Thresholds, sensing, and impedances consistent with implant measurements. Device programmed at 3.0V for extra safety margin until 3 month visit. Histogram distribution appropriate for patient and level of activity. No ventricular arrhythmias noted. Know AF. Burden 99.3% since implant. +OAC. Patient educated about wound care, arm mobility, lifting restrictions, shock plan. Patient enrolled in remote monitoring with next transmission 8/3. 91 day follow up with Dr. Quentin Ore 11/10/21. Ablation scheduled 11/11/21. ?

## 2021-08-20 NOTE — Patient Instructions (Signed)

## 2021-09-02 ENCOUNTER — Encounter: Payer: Self-pay | Admitting: *Deleted

## 2021-09-11 LAB — HEPATIC FUNCTION PANEL
ALT: 33 IU/L (ref 0–44)
AST: 21 IU/L (ref 0–40)
Albumin: 4.6 g/dL (ref 3.8–4.8)
Alkaline Phosphatase: 83 IU/L (ref 44–121)
Bilirubin Total: 0.9 mg/dL (ref 0.0–1.2)
Bilirubin, Direct: 0.3 mg/dL (ref 0.00–0.40)
Total Protein: 6.7 g/dL (ref 6.0–8.5)

## 2021-09-11 LAB — LIPID PANEL
Chol/HDL Ratio: 2.5 ratio (ref 0.0–5.0)
Cholesterol, Total: 117 mg/dL (ref 100–199)
HDL: 47 mg/dL (ref >39)
LDL Chol Calc (NIH): 56 mg/dL (ref 0–99)
Triglycerides: 69 mg/dL (ref 0–149)
VLDL Cholesterol Cal: 14 mg/dL (ref 5–40)

## 2021-09-12 ENCOUNTER — Encounter: Payer: Self-pay | Admitting: *Deleted

## 2021-09-18 ENCOUNTER — Encounter: Payer: Self-pay | Admitting: Cardiology

## 2021-09-26 ENCOUNTER — Other Ambulatory Visit: Payer: Self-pay | Admitting: Cardiology

## 2021-09-26 DIAGNOSIS — I421 Obstructive hypertrophic cardiomyopathy: Secondary | ICD-10-CM

## 2021-09-30 ENCOUNTER — Encounter: Payer: Self-pay | Admitting: Medical-Surgical

## 2021-09-30 ENCOUNTER — Ambulatory Visit (INDEPENDENT_AMBULATORY_CARE_PROVIDER_SITE_OTHER): Payer: PRIVATE HEALTH INSURANCE | Admitting: Medical-Surgical

## 2021-09-30 VITALS — BP 107/75 | HR 63 | Resp 20 | Ht 70.0 in | Wt 226.2 lb

## 2021-09-30 DIAGNOSIS — N529 Male erectile dysfunction, unspecified: Secondary | ICD-10-CM

## 2021-09-30 DIAGNOSIS — M1A9XX0 Chronic gout, unspecified, without tophus (tophi): Secondary | ICD-10-CM | POA: Diagnosis not present

## 2021-09-30 DIAGNOSIS — Z0000 Encounter for general adult medical examination without abnormal findings: Secondary | ICD-10-CM

## 2021-09-30 DIAGNOSIS — M722 Plantar fascial fibromatosis: Secondary | ICD-10-CM

## 2021-09-30 DIAGNOSIS — E7800 Pure hypercholesterolemia, unspecified: Secondary | ICD-10-CM

## 2021-09-30 DIAGNOSIS — F419 Anxiety disorder, unspecified: Secondary | ICD-10-CM

## 2021-09-30 DIAGNOSIS — E349 Endocrine disorder, unspecified: Secondary | ICD-10-CM

## 2021-09-30 DIAGNOSIS — Z125 Encounter for screening for malignant neoplasm of prostate: Secondary | ICD-10-CM

## 2021-09-30 DIAGNOSIS — G479 Sleep disorder, unspecified: Secondary | ICD-10-CM

## 2021-10-01 LAB — PSA: PSA: 0.2 ng/mL (ref <=4.00)

## 2021-10-01 LAB — TESTOSTERONE: Testosterone: 375 ng/dL (ref 250–827)

## 2021-10-14 ENCOUNTER — Encounter: Payer: PRIVATE HEALTH INSURANCE | Admitting: Genetic Counselor

## 2021-10-21 ENCOUNTER — Other Ambulatory Visit: Payer: PRIVATE HEALTH INSURANCE

## 2021-10-24 NOTE — H&P (Deleted)
Electrophysiology Office Follow up Visit Note:     Date:  10/24/2021    ID:  Dale Rivera, DOB Feb 25, 1961, MRN [id number]   PCP:  Christen Butter, NP            [facility] Cardiologist:  Olga Millers, MD  [facility] Electrophysiologist:  Lanier Prude, MD      Interval History:     Dale Rivera is a 61 y.o. male who presents for a follow up visit.  I last saw him June 02, 2021 for his diagnosis of hypertrophic obstructive cardiomyopathy and persistent atrial fibrillation.  He has a history of arrhythmic syncope and significant LGE on a cardiac MRI.  At the last appointment we decided to repeat his cardiac MRI to reassess the scar burden and LV function.  He presents today for follow-up.   The patient saw Dr. Jens Som on June 25, 2021 in follow-up. He was noted to be symptomatic with mild increased DOE and fatigue. He was instructed to avoid vigorous exercise. They also discussed the possibility of genetic testing for hypertrophic cardiomyopathy because he has a daughter. Given his elevated calcium score, Lipitor was increased to 80 mg daily.   Today, he is feeling good overall. We reviewed in detail his indications and procedural details for an ICD.   Usually his heart rate is stable per his own monitoring, averaging 65-75 bpm on a given day.   During his previous syncopal episodes he could usually feel a prodrome.   He works as a Emergency planning/management officer, and is considering retirement this coming September.   He denies any palpitations, chest pain, shortness of breath, or peripheral edema. No lightheadedness, headaches, orthopnea, or PND.   In his family, his sister also has hypertrophic cardiomyopathy.  He has done well since the ICD implant. He presents today for PVI.     Objective      Past Medical History:  Diagnosis Date   Atrial fibrillation (HCC)     Cardiomyopathy      hypertrophic   Gout     HYPERLIPIDEMIA 07/02/2009    Qualifier: Diagnosis of  By: Jens Som,  MD, Lyn Hollingshead    Hypertension     Hypertrophic obstructive cardiomyopathy (HCC) 07/03/2009    Qualifier: Diagnosis of  By: Jens Som, MD, Lyn Hollingshead    Lower extremity edema 11/15/2013   Palpitations 09/18/2009    Qualifier: Diagnosis of  By: Jens Som, MD, Lyn Hollingshead    SYNCOPE 07/02/2009    Qualifier: Diagnosis of  By: Jens Som, MD, Lyn Hollingshead            Past Surgical History:  Procedure Laterality Date   CARDIOVERSION N/A 05/08/2021    Procedure: CARDIOVERSION;  Surgeon: Chilton Si, MD;  Location: [facility] ENDOSCOPY;  Service: Cardiovascular;  Laterality: N/A;   CERVICAL DISC SURGERY       COLONOSCOPY   09/09/2018   TONSILLECTOMY          Current Medications: Active Medications      Current Meds  Medication Sig   acetaminophen (TYLENOL) 650 MG CR tablet Take 650 mg by mouth every 8 (eight) hours as needed for pain.   allopurinol (ZYLOPRIM) 100 MG tablet Take 1 tablet (100 mg total) by mouth 2 (two) times daily. TAKE ONE TABLET TWICE DAILY   AMBULATORY NON FORMULARY MEDICATION Continuous positive airway pressure (CPAP) machine set on AutoPAP (4-20 cmH2O), with all supplemental supplies as needed.   amLODipine (NORVASC) 5 MG tablet Take 1 tablet (  5 mg total) by mouth daily.   atorvastatin (LIPITOR) 80 MG tablet Take 1 tablet (80 mg total) by mouth daily.   busPIRone (BUSPAR) 5 MG tablet Take 1-3 tablets (5-15 mg total) by mouth 2 (two) times daily as needed.   ELIQUIS 5 MG TABS tablet TAKE 1 TABLET BY MOUTH  TWICE DAILY   metoprolol succinate (TOPROL-XL) 25 MG 24 hr tablet TAKE 3 TABLETS BY MOUTH DAILY  WITH OR IMMEDIATELY FOLLOWING A  MEAL   sildenafil (REVATIO) 20 MG tablet Take 1-5 tablets (20-100 mg total) by mouth as needed.        Allergies:   Patient has no known allergies.    Social History         Socioeconomic History   Marital status: Married      Spouse name: Not on file   Number of children: 1   Years of education:  Not on file   Highest education level: Not on file  Occupational History   Not on file  Tobacco Use   Smoking status: Never   Smokeless tobacco: Never   Tobacco comments:      Never smoke (12/24/2020)  Vaping Use   Vaping Use: Never used  Substance and Sexual Activity   Alcohol use: Yes      Alcohol/week: 2.0 standard drinks      Types: 2 Cans of beer per week      Comment: 2 beers once a week 04/24/2021   Drug use: Never   Sexual activity: Yes  Other Topics Concern   Not on file  Social History Narrative    Full time.     Social Determinants of Health    Financial Resource Strain: Not on file  Food Insecurity: Not on file  Transportation Needs: Not on file  Physical Activity: Not on file  Stress: Not on file  Social Connections: Not on file      Family History: The patient's family history includes Coronary artery disease in an other family member; Diabetes in an other family member; Heart attack in his father; Heart failure in his mother; Hypertension in his mother and sister. There is no history of Colon cancer, Rectal cancer, Stomach cancer, Esophageal cancer, Pancreatic cancer, or Liver cancer.   ROS:   Please see the history of present illness.    All other systems reviewed and are negative.   EKGs/Labs/Other Studies Reviewed:     The following studies were reviewed today:   July 13, 2021 cardiac MRI Severe asymmetric hypertrophy up to 23 mm LGE 10%   March 10, 2021 Lexiscan Myoview   The study is normal. The study is low risk.   No ST deviation was noted.   LV perfusion is normal. There is no evidence of ischemia. There is no evidence of infarction.   Left ventricular function is abnormal. Nuclear stress EF: 33 %. The left ventricular ejection fraction is moderately decreased (30-44%). End diastolic cavity size is normal. End systolic cavity size is normal.   Prior study available for comparison from 01/15/2017.   Normal study without evidence of  ischemia or infarction.  LVEF visually is normal but was computed at 33% due to cavity obliteration. Would confirm with an echocardiogram.  This is a low-risk study based on perfusion. LVEF likely inaccurate.   February 12, 2021 Coronary Calcium Score  FINDINGS: Coronary Calcium Score:   Left main: 0   Left anterior descending artery: 308   Left circumflex artery: 17.8   Right  coronary artery: 113   Total: 439   Percentile: 86   Pericardium: Normal.   Ascending Aorta: Normal caliber; aortic atherosclerosis.   Non-cardiac: See separate report from [facility].   IMPRESSION: Coronary calcium score of 439. This was 60 percentile for age-, race-, and sex-matched controls.   Aortic atherosclerosis   May 20, 2020 Echo  1. Left ventricular ejection fraction, by estimation, is 55 to 60%. The  left ventricle has normal function. The left ventricle has no regional  wall motion abnormalities. There is severe concentric left ventricular  hypertrophy. Increased LVOT gradient 32   mmHG with valsalva. Intracavitary obliteration is noted. No systolic  anterior motion. Left ventricular diastolic parameters are indeterminate.   2. Right ventricular systolic function is normal. The right ventricular  size is normal.   3. Left atrial size was mildly dilated.   4. The mitral valve is normal in structure. No evidence of mitral valve  regurgitation. No evidence of mitral stenosis.   5. The aortic valve is normal in structure. Aortic valve regurgitation is  not visualized. No aortic stenosis is present.   6. Unable to determine pulmonary artery systolic pressure, No TR jet  spectral display.    December 2019 cardiac MRI High risk for VT given LGE Hypertrophic cardiomyopathy 14% LGE         EKG:  EKG is personally reviewed. 07/16/2021: Atrial fibrillation, LVH   Recent Labs: 09/26/2020: TSH 2.73 04/24/2021: ALT 35; BUN 13; Creatinine, Ser 1.19; Potassium 5.1; Sodium  141 06/02/2021: Hemoglobin 16.9; Platelets 279    Recent Lipid Panel Labs (Brief)          Component Value Date/Time    CHOL 117 04/24/2021 0854    TRIG 53 04/24/2021 0854    HDL 44 04/24/2021 0854    CHOLHDL 2.7 04/24/2021 0854    VLDL 11 04/24/2021 0854    LDLCALC 62 04/24/2021 0854    LDLCALC 89 09/26/2020 0000        Physical Exam:     VS:  BP 110/80   Pulse 73   Ht 5\' 10"  (1.778 m)   Wt 226 lb 6.4 oz (102.7 kg)   SpO2 96%   BMI 32.49 kg/m         Wt Readings from Last 3 Encounters:  07/16/21 226 lb 6.4 oz (102.7 kg)  06/25/21 225 lb 1.9 oz (102.1 kg)  06/02/21 226 lb (102.5 kg)      GEN: Well nourished, well developed in no acute distress HEENT: Normal NECK: No JVD; No carotid bruits LYMPHATICS: No lymphadenopathy CARDIAC: Irregularly irregular, no murmurs, rubs, gallops RESPIRATORY:  Clear to auscultation without rales, wheezing or rhonchi  ABDOMEN: Soft, non-tender, non-distended MUSCULOSKELETAL:  No edema; No deformity  SKIN: Warm and dry NEUROLOGIC:  Alert and oriented x 3 PSYCHIATRIC:  Normal affect            Assessment ASSESSMENT:     1. Hypertrophic obstructive cardiomyopathy (HCC)   2. Persistent atrial fibrillation (HCC)     PLAN:     In order of problems listed above:   #Hypertrophic obstructive cardiomyopathy ICD in situ. Rhythm control indicated as below.  #Persistent atrial fibrillation Rhythm control indicated given history of HOCM.  On Eliquis for stroke prophylaxis.      Risk, benefits, and alternatives to EP study and radiofrequency ablation for afib were also discussed in detail today. These risks include but are not limited to stroke, bleeding, vascular damage, tamponade, perforation, damage to the  esophagus, lungs, and other structures, pulmonary vein stenosis, worsening renal function, and death. The patient understands these risk and wishes to proceed.  We will therefore proceed with catheter ablation at the next available  time.  Carto, ICE, anesthesia are requested for the procedure.  Will also obtain CT PV protocol prior to the procedure to exclude LAA thrombus and further evaluate atrial anatomy.      Presents for PVI. Procedure reviewed in detail with the patient.      Signed, Steffanie Dunn, MD, [facility], [facility] 10/24/2021 Electrophysiology [facility]

## 2021-10-28 ENCOUNTER — Other Ambulatory Visit: Payer: PRIVATE HEALTH INSURANCE

## 2021-10-29 ENCOUNTER — Other Ambulatory Visit: Payer: PRIVATE HEALTH INSURANCE

## 2021-11-03 ENCOUNTER — Telehealth (HOSPITAL_COMMUNITY): Payer: Self-pay | Admitting: Emergency Medicine

## 2021-11-03 NOTE — Telephone Encounter (Signed)
Attempted to call patient regarding upcoming cardiac CT appointment. °Left message on voicemail with name and callback number °Syair Fricker RN Navigator Cardiac Imaging °[facility] °[phone] Office °[phone] Cell ° °

## 2021-11-03 NOTE — Telephone Encounter (Signed)
Reaching out to patient to offer assistance regarding upcoming cardiac imaging study; pt verbalizes understanding of appt date/time, parking situation and where to check in, pre-test NPO status and medications ordered, and verified current allergies; name and call back number provided for further questions should they arise Rockwell Alexandria RN Navigator Cardiac Imaging [facility] [phone] office [phone] cell  Dnies iv issues Arrival 100 w/c entrance Daily meds

## 2021-11-04 ENCOUNTER — Ambulatory Visit (HOSPITAL_COMMUNITY)
Admission: RE | Admit: 2021-11-04 | Discharge: 2021-11-04 | Disposition: A | Discharge status: Home / Self Care | Payer: PRIVATE HEALTH INSURANCE | Source: Ambulatory Visit | Attending: Cardiology | Admitting: Cardiology

## 2021-11-04 ENCOUNTER — Other Ambulatory Visit: Payer: PRIVATE HEALTH INSURANCE

## 2021-11-04 DIAGNOSIS — I421 Obstructive hypertrophic cardiomyopathy: Secondary | ICD-10-CM

## 2021-11-04 DIAGNOSIS — I4891 Unspecified atrial fibrillation: Secondary | ICD-10-CM | POA: Diagnosis not present

## 2021-11-04 DIAGNOSIS — Z01818 Encounter for other preprocedural examination: Secondary | ICD-10-CM

## 2021-11-04 DIAGNOSIS — I4819 Other persistent atrial fibrillation: Secondary | ICD-10-CM

## 2021-11-04 LAB — CBC WITH DIFFERENTIAL/PLATELET
Basophils Absolute: 0 10*3/uL (ref 0.0–0.2)
Basos: 0 %
EOS (ABSOLUTE): 0.1 10*3/uL (ref 0.0–0.4)
Eos: 1 %
Hematocrit: 44.8 % (ref 37.5–51.0)
Hemoglobin: 15.7 g/dL (ref 13.0–17.7)
Lymphocytes Absolute: 3 10*3/uL (ref 0.7–3.1)
Lymphs: 30 %
MCH: 32.1 pg (ref 26.6–33.0)
MCHC: 35 g/dL (ref 31.5–35.7)
MCV: 92 fL (ref 79–97)
Monocytes Absolute: 1.1 10*3/uL — ABNORMAL HIGH (ref 0.1–0.9)
Monocytes: 11 %
Neutrophils Absolute: 5.8 10*3/uL (ref 1.4–7.0)
Neutrophils: 58 %
Platelets: 280 10*3/uL (ref 150–450)
RBC: 4.89 x10E6/uL (ref 4.14–5.80)
RDW: 13.8 % (ref 11.6–15.4)
WBC: 10.1 10*3/uL (ref 3.4–10.8)

## 2021-11-04 LAB — BASIC METABOLIC PANEL
BUN/Creatinine Ratio: 19 (ref 10–24)
BUN: 21 mg/dL (ref 8–27)
CO2: 26 mmol/L (ref 20–29)
Calcium: 9.5 mg/dL (ref 8.6–10.2)
Chloride: 105 mmol/L (ref 96–106)
Creatinine, Ser: 1.09 mg/dL (ref 0.76–1.27)
Glucose: 104 mg/dL — ABNORMAL HIGH (ref 70–99)
Potassium: 4.4 mmol/L (ref 3.5–5.2)
Sodium: 139 mmol/L (ref 134–144)
eGFR: 76 mL/min/{1.73_m2} (ref >59)

## 2021-11-04 MED ORDER — IOHEXOL 350 MG/ML SOLN
100.0000 mL | Freq: Once | INTRAVENOUS | Status: AC | PRN
  Administered 2021-11-04: 100 mL via INTRAVENOUS

## 2021-11-06 ENCOUNTER — Ambulatory Visit (INDEPENDENT_AMBULATORY_CARE_PROVIDER_SITE_OTHER): Payer: PRIVATE HEALTH INSURANCE

## 2021-11-06 DIAGNOSIS — I421 Obstructive hypertrophic cardiomyopathy: Secondary | ICD-10-CM | POA: Diagnosis not present

## 2021-11-06 LAB — CUP PACEART REMOTE DEVICE CHECK
Battery Voltage: 3.12 V
Brady Statistic RV Percent Paced: 4 %
Date Time Interrogation Session: 20230803082824
HighPow Impedance: 72 Ohm
Implantable Lead Implant Date: 20230502
Implantable Lead Location: 753860
Implantable Lead Model: 436909
Implantable Lead Serial Number: 81487767
Implantable Pulse Generator Implant Date: 20230502
Lead Channel Impedance Value: 646 Ohm
Lead Channel Pacing Threshold Amplitude: 0.4 V
Lead Channel Pacing Threshold Pulse Width: 0.4 ms
Lead Channel Sensing Intrinsic Amplitude: 0.8 mV
Lead Channel Sensing Intrinsic Amplitude: 20 mV
Lead Channel Setting Pacing Amplitude: 3 V
Lead Channel Setting Pacing Pulse Width: 0.4 ms
Pulse Gen Model: 429525
Pulse Gen Serial Number: 84895165

## 2021-11-09 NOTE — Progress Notes (Deleted)
Electrophysiology Office Follow up Visit Note:    Date:  11/09/2021   ID:  Elvia Collum, DOB 1960-07-29, MRN [id number]  PCP:  Christen Butter, NP  [facility] Cardiologist:  Olga Millers, MD  [facility] Electrophysiologist:  Lanier Prude, MD    Interval History:    Jakory Matsuo is a 61 y.o. male who presents for a follow up visit after ICD implant 08/05/2021. He has a Biotronik VDD system. He is scheduled for an AF ablation 11/11/2021 for his persistent AF.        Past Medical History:  Diagnosis Date   Atrial fibrillation (HCC)    Cardiomyopathy    hypertrophic   Gout    HYPERLIPIDEMIA 07/02/2009   Qualifier: Diagnosis of  By: Jens Som, MD, Lyn Hollingshead    Hypertension    Hypertrophic obstructive cardiomyopathy (HCC) 07/03/2009   Qualifier: Diagnosis of  By: Jens Som, MD, Lyn Hollingshead    Lower extremity edema 11/15/2013   Palpitations 09/18/2009   Qualifier: Diagnosis of  By: Jens Som, MD, Lyn Hollingshead    SYNCOPE 07/02/2009   Qualifier: Diagnosis of  By: Jens Som, MD, Lyn Hollingshead     Past Surgical History:  Procedure Laterality Date   CARDIOVERSION N/A 05/08/2021   Procedure: CARDIOVERSION;  Surgeon: Chilton Si, MD;  Location: [facility] ENDOSCOPY;  Service: Cardiovascular;  Laterality: N/A;   CERVICAL DISC SURGERY     COLONOSCOPY  09/09/2018   ICD IMPLANT N/A 08/05/2021   Procedure: ICD IMPLANT;  Surgeon: Lanier Prude, MD;  Location: [facility] INVASIVE CV LAB;  Service: Cardiovascular;  Laterality: N/A;   TONSILLECTOMY      Current Medications: No outpatient medications have been marked as taking for the 11/10/21 encounter (Appointment) with Lanier Prude, MD.     Allergies:   Patient has no known allergies.   Social History   Socioeconomic History   Marital status: Married    Spouse name: Not on file   Number of children: 1   Years of education: Not on file   Highest education level: Not on file  Occupational  History   Not on file  Tobacco Use   Smoking status: Never   Smokeless tobacco: Never   Tobacco comments:    Never smoke (12/24/2020)  Vaping Use   Vaping Use: Never used  Substance and Sexual Activity   Alcohol use: Yes    Alcohol/week: 2.0 standard drinks of alcohol    Types: 2 Cans of beer per week    Comment: 2 beers once a week 04/24/2021   Drug use: Never   Sexual activity: Yes  Other Topics Concern   Not on file  Social History Narrative   Full time.    Social Determinants of Health   Financial Resource Strain: Not on file  Food Insecurity: Not on file  Transportation Needs: Not on file  Physical Activity: Not on file  Stress: Not on file  Social Connections: Not on file     Family History: The patient's family history includes Coronary artery disease in an other family member; Diabetes in an other family member; Heart attack in his father; Heart failure in his mother; Hypertension in his mother and sister. There is no history of Colon cancer, Rectal cancer, Stomach cancer, Esophageal cancer, Pancreatic cancer, or Liver cancer.  ROS:   Please see the history of present illness.    All other systems reviewed and are negative.  EKGs/Labs/Other Studies Reviewed:    The following studies were  reviewed today:  11/10/2021 In clinic device interrogation personally reviewed ***    Recent Labs: 09/10/2021: ALT 33 11/04/2021: BUN 21; Creatinine, Ser 1.09; Hemoglobin 15.7; Platelets 280; Potassium 4.4; Sodium 139  Recent Lipid Panel    Component Value Date/Time   CHOL 117 09/10/2021 0819   TRIG 69 09/10/2021 0819   HDL 47 09/10/2021 0819   CHOLHDL 2.5 09/10/2021 0819   CHOLHDL 2.7 04/24/2021 0854   VLDL 11 04/24/2021 0854   LDLCALC 56 09/10/2021 0819   LDLCALC 89 09/26/2020 0000    Physical Exam:    VS:  There were no vitals taken for this visit.    Wt Readings from Last 3 Encounters:  09/30/21 226 lb 3.2 oz (102.6 kg)  08/05/21 225 lb (102.1 kg)  07/16/21  226 lb 6.4 oz (102.7 kg)     GEN: *** Well nourished, well developed in no acute distress HEENT: Normal NECK: No JVD; No carotid bruits LYMPHATICS: No lymphadenopathy CARDIAC: ***Irregularly irregular, no murmurs, rubs, gallops. ICD pocket well healed. RESPIRATORY:  Clear to auscultation without rales, wheezing or rhonchi  ABDOMEN: Soft, non-tender, non-distended MUSCULOSKELETAL:  No edema; No deformity  SKIN: Warm and dry NEUROLOGIC:  Alert and oriented x 3 PSYCHIATRIC:  Normal affect        ASSESSMENT:    1. Hypertrophic obstructive cardiomyopathy (HCC)   2. Implantable cardioverter-defibrillator (ICD) in situ   3. Persistent atrial fibrillation (HCC)    PLAN:    In order of problems listed above:  #HCM #ICD in situ Device functioning approprirately. No further syncopal episodes. Significant LGE on cMR. Continue remote monitoring.  #Persistent AF Symptomatic. On eliquis for stroke ppx.  Discussed treatment options today for his AF including antiarrhythmic drug therapy and ablation. Discussed risks, recovery and likelihood of success. Discussed potential need for repeat ablation procedures and antiarrhythmic drugs after an initial ablation. They wish to proceed with scheduling.  Risk, benefits, and alternatives to EP study and radiofrequency ablation for afib were also discussed in detail today. These risks include but are not limited to stroke, bleeding, vascular damage, tamponade, perforation, damage to the esophagus, lungs, and other structures, pulmonary vein stenosis, worsening renal function, and death. The patient understands these risk and wishes to proceed.  We will therefore proceed with catheter ablation at the next available time.  Carto, ICE, anesthesia are requested for the procedure.  Will also obtain CT PV protocol prior to the procedure to exclude LAA thrombus and further evaluate atrial anatomy.    Total time spent with patient today *** minutes. This  includes reviewing records, evaluating the patient and coordinating care.   Medication Adjustments/Labs and Tests Ordered: Current medicines are reviewed at length with the patient today.  Concerns regarding medicines are outlined above.  No orders of the defined types were placed in this encounter.  No orders of the defined types were placed in this encounter.    Signed, Steffanie Dunn, MD, [facility], [facility] 11/09/2021 3:56 PM    Electrophysiology [facility]

## 2021-11-10 ENCOUNTER — Encounter: Payer: Self-pay | Admitting: Cardiology

## 2021-11-10 ENCOUNTER — Ambulatory Visit (INDEPENDENT_AMBULATORY_CARE_PROVIDER_SITE_OTHER): Payer: PRIVATE HEALTH INSURANCE | Admitting: Cardiology

## 2021-11-10 VITALS — BP 118/76 | HR 66 | Ht 70.0 in | Wt 224.2 lb

## 2021-11-10 DIAGNOSIS — I4819 Other persistent atrial fibrillation: Secondary | ICD-10-CM

## 2021-11-10 DIAGNOSIS — Z95810 Presence of automatic (implantable) cardiac defibrillator: Secondary | ICD-10-CM

## 2021-11-10 DIAGNOSIS — I421 Obstructive hypertrophic cardiomyopathy: Secondary | ICD-10-CM | POA: Diagnosis not present

## 2021-11-10 NOTE — Progress Notes (Signed)
Electrophysiology Office Follow up Visit Note:    Date:  11/10/2021   ID:  Dale Rivera, DOB 11/28/1960, MRN [id number]  PCP:  Jessup, Joy, NP  [facility] Cardiologist:  Malik Crenshaw, MD  [facility] Electrophysiologist:  Dorlene Footman T Ayannah Faddis, MD    Interval History:    Dale Rivera is a 61 y.o. male who presents for a follow up visit after ICD implant 08/05/2021. He has a Biotronik VDD system. He is scheduled for an AF ablation 11/11/2021 for his persistent AF.  Today, he denies any pain or issues regarding his ICD implant.  He remains compliant with Eliquis; we reviewed instructions to hold his morning dose tomorrow prior to his ablation.  He is staying active; enjoys playing golf.  They deny any palpitations, chest pain, shortness of breath, or peripheral edema. No lightheadedness, headaches, syncope, orthopnea, or PND.      Past Medical History:  Diagnosis Date   Atrial fibrillation (HCC)    Cardiomyopathy    hypertrophic   Gout    HYPERLIPIDEMIA 07/02/2009   Qualifier: Diagnosis of  By: Crenshaw, MD, FACC, Bertha Saunders    Hypertension    Hypertrophic obstructive cardiomyopathy (HCC) 07/03/2009   Qualifier: Diagnosis of  By: Crenshaw, MD, FACC, Aztlan Saunders    Lower extremity edema 11/15/2013   Palpitations 09/18/2009   Qualifier: Diagnosis of  By: Crenshaw, MD, FACC, Orry Saunders    SYNCOPE 07/02/2009   Qualifier: Diagnosis of  By: Crenshaw, MD, FACC, Ichiro Saunders     Past Surgical History:  Procedure Laterality Date   CARDIOVERSION N/A 05/08/2021   Procedure: CARDIOVERSION;  Surgeon: Milford, Tiffany, MD;  Location: [facility] ENDOSCOPY;  Service: Cardiovascular;  Laterality: N/A;   CERVICAL DISC SURGERY     COLONOSCOPY  09/09/2018   ICD IMPLANT N/A 08/05/2021   Procedure: ICD IMPLANT;  Surgeon: Ayriana Wix T, MD;  Location: [facility] INVASIVE CV LAB;  Service: Cardiovascular;  Laterality: N/A;   TONSILLECTOMY      Current Medications: Current Meds   Medication Sig   acetaminophen (TYLENOL) 650 MG CR tablet Take 650 mg by mouth every 8 (eight) hours as needed for pain.   allopurinol (ZYLOPRIM) 100 MG tablet Take 1 tablet (100 mg total) by mouth 2 (two) times daily. TAKE ONE TABLET TWICE DAILY   AMBULATORY NON FORMULARY MEDICATION Continuous positive airway pressure (CPAP) machine set on AutoPAP (4-20 cmH2O), with all supplemental supplies as needed.   amLODipine (NORVASC) 5 MG tablet TAKE 1 TABLET BY MOUTH  DAILY   apixaban (ELIQUIS) 5 MG TABS tablet Take 1 tablet (5 mg total) by mouth 2 (two) times daily.   atorvastatin (LIPITOR) 80 MG tablet Take 1 tablet (80 mg total) by mouth daily.   busPIRone (BUSPAR) 5 MG tablet Take 1-3 tablets (5-15 mg total) by mouth 2 (two) times daily as needed.   metoprolol succinate (TOPROL-XL) 25 MG 24 hr tablet TAKE 3 TABLETS BY MOUTH DAILY  WITH OR IMMEDIATELY FOLLOWING A  MEAL   sildenafil (REVATIO) 20 MG tablet Take 1-5 tablets (20-100 mg total) by mouth as needed.     Allergies:   Patient has no known allergies.   Social History   Socioeconomic History   Marital status: Married    Spouse name: Not on file   Number of children: 1   Years of education: Not on file   Highest education level: Not on file  Occupational History   Not on file  Tobacco Use   Smoking status: Never     Smokeless tobacco: Never   Tobacco comments:    Never smoke (12/24/2020)  Vaping Use   Vaping Use: Never used  Substance and Sexual Activity   Alcohol use: Yes    Alcohol/week: 2.0 standard drinks of alcohol    Types: 2 Cans of beer per week    Comment: 2 beers once a week 04/24/2021   Drug use: Never   Sexual activity: Yes  Other Topics Concern   Not on file  Social History Narrative   Full time.    Social Determinants of Health   Financial Resource Strain: Not on file  Food Insecurity: Not on file  Transportation Needs: Not on file  Physical Activity: Not on file  Stress: Not on file  Social  Connections: Not on file     Family History: The patient's family history includes Coronary artery disease in an other family member; Diabetes in an other family member; Heart attack in his father; Heart failure in his mother; Hypertension in his mother and sister. There is no history of Colon cancer, Rectal cancer, Stomach cancer, Esophageal cancer, Pancreatic cancer, or Liver cancer.  ROS:   Please see the history of present illness.    All other systems reviewed and are negative.  EKGs/Labs/Other Studies Reviewed:    The following studies were reviewed today:  11/10/2021  In clinic device interrogation personally reviewed: Battery longevity 100% Lead parameters stable 99.3% A-fib burden 4% ventricular pacing Lead outputs adjusted to maximize battery longevity   EKG:  EKG is personally reviewed.  11/10/2021:  EKG was not ordered.   Recent Labs: 09/10/2021: ALT 33 11/04/2021: BUN 21; Creatinine, Ser 1.09; Hemoglobin 15.7; Platelets 280; Potassium 4.4; Sodium 139   Recent Lipid Panel    Component Value Date/Time   CHOL 117 09/10/2021 0819   TRIG 69 09/10/2021 0819   HDL 47 09/10/2021 0819   CHOLHDL 2.5 09/10/2021 0819   CHOLHDL 2.7 04/24/2021 0854   VLDL 11 04/24/2021 0854   LDLCALC 56 09/10/2021 0819   LDLCALC 89 09/26/2020 0000    Physical Exam:    VS:  BP 118/76   Pulse 66   Ht 5\' 10"  (1.778 m)   Wt 224 lb 3.2 oz (101.7 kg)   SpO2 97%   BMI 32.17 kg/m     Wt Readings from Last 3 Encounters:  11/10/21 224 lb 3.2 oz (101.7 kg)  09/30/21 226 lb 3.2 oz (102.6 kg)  08/05/21 225 lb (102.1 kg)     GEN: Well nourished, well developed in no acute distress HEENT: Normal NECK: No JVD; No carotid bruits LYMPHATICS: No lymphadenopathy CARDIAC: Irregularly irregular, no murmurs, rubs, gallops; Device pocket well healed. RESPIRATORY:  Clear to auscultation without rales, wheezing or rhonchi  ABDOMEN: Soft, non-tender, non-distended MUSCULOSKELETAL:  No edema; No  deformity  SKIN: Warm and dry NEUROLOGIC:  Alert and oriented x 3 PSYCHIATRIC:  Normal affect        ASSESSMENT:    1. Hypertrophic obstructive cardiomyopathy (HCC)   2. Implantable cardioverter-defibrillator (ICD) in situ   3. Persistent atrial fibrillation (HCC)    PLAN:    In order of problems listed above:  #HCM #ICD in situ Device functioning approprirately. No further syncopal episodes. Significant LGE on cMR. Continue remote monitoring.   #Persistent AF Symptomatic. On eliquis for stroke ppx.   Discussed treatment options today for his AF including antiarrhythmic drug therapy and ablation. Discussed risks, recovery and likelihood of success. Discussed potential need for repeat ablation procedures and antiarrhythmic drugs  after an initial ablation. They wish to proceed with scheduling.   Risk, benefits, and alternatives to EP study and radiofrequency ablation for afib were also discussed in detail today. These risks include but are not limited to stroke, bleeding, vascular damage, tamponade, perforation, damage to the esophagus, lungs, and other structures, pulmonary vein stenosis, worsening renal function, and death. The patient understands these risk and wishes to proceed.  We will therefore proceed with catheter ablation at the next available time.  Carto, ICE, anesthesia are requested for the procedure.  Will also obtain CT PV protocol prior to the procedure to exclude LAA thrombus and further evaluate atrial anatomy.    Medication Adjustments/Labs and Tests Ordered: Current medicines are reviewed at length with the patient today.  Concerns regarding medicines are outlined above.   No orders of the defined types were placed in this encounter.  No orders of the defined types were placed in this encounter.  I,Mathew Stumpf,acting as a Neurosurgeon for Lanier Prude, MD.,have documented all relevant documentation on the behalf of Lanier Prude, MD,as directed by   Lanier Prude, MD while in the presence of Lanier Prude, MD.  I, Lanier Prude, MD, have reviewed all documentation for this visit. The documentation on 11/10/21 for the exam, diagnosis, procedures, and orders are all accurate and complete.   Signed, Steffanie Dunn, MD, [facility], [facility] 11/10/2021 2:26 PM    Electrophysiology [facility]

## 2021-11-10 NOTE — Patient Instructions (Signed)
Medication Instructions:  Your physician recommends that you continue on your current medications as directed. Please refer to the Current Medication list given to you today.  *If you need a refill on your cardiac medications before your next appointment, please call your pharmacy*   Lab Work: None today   If you have labs (blood work) drawn today and your tests are completely normal, you will receive your results only by: MyChart Message (if you have MyChart) OR A paper copy in the mail If you have any lab test that is abnormal or we need to change your treatment, we will call you to review the results.   Testing/Procedures: Keep plan for Ablation tomorrow   Follow-Up: Keep regular scheduled appointments after ablation  Important Information About Sugar

## 2021-11-10 NOTE — Pre-Procedure Instructions (Signed)
Attempted to call patient regarding procedure instructions.  Left voice mail on the following items: Arrival time 0830 Nothing to eat or drink after midnight No meds AM of procedure Responsible person to drive you home and stay with you for 24 hrs  Have you missed any doses of anti-coagulant Eliquis- if you have missed any doses please let us know, you will miss tomorrow morning dose

## 2021-11-10 NOTE — H&P (View-Only) (Signed)
Electrophysiology Office Follow up Visit Note:    Date:  11/10/2021   ID:  Dale Rivera, DOB [birth year], MRN [id number]  PCP:  Christen Butter, NP  [facility] Cardiologist:  Olga Millers, MD  [facility] Electrophysiologist:  Lanier Prude, MD    Interval History:    Dale Rivera is a 61 y.o. male who presents for a follow up visit after ICD implant 08/05/2021. He has a Biotronik VDD system. He is scheduled for an AF ablation 11/11/2021 for his persistent AF.  Today, he denies any pain or issues regarding his ICD implant.  He remains compliant with Eliquis; we reviewed instructions to hold his morning dose tomorrow prior to his ablation.  He is staying active; enjoys playing golf.  They deny any palpitations, chest pain, shortness of breath, or peripheral edema. No lightheadedness, headaches, syncope, orthopnea, or PND.      Past Medical History:  Diagnosis Date   Atrial fibrillation (HCC)    Cardiomyopathy    hypertrophic   Gout    HYPERLIPIDEMIA 07/02/2009   Qualifier: Diagnosis of  By: Jens Som, MD, Lyn Hollingshead    Hypertension    Hypertrophic obstructive cardiomyopathy (HCC) 07/03/2009   Qualifier: Diagnosis of  By: Jens Som, MD, Lyn Hollingshead    Lower extremity edema 11/15/2013   Palpitations 09/18/2009   Qualifier: Diagnosis of  By: Jens Som, MD, Lyn Hollingshead    SYNCOPE 07/02/2009   Qualifier: Diagnosis of  By: Jens Som, MD, Lyn Hollingshead     Past Surgical History:  Procedure Laterality Date   CARDIOVERSION N/A 05/08/2021   Procedure: CARDIOVERSION;  Surgeon: Chilton Si, MD;  Location: [facility] ENDOSCOPY;  Service: Cardiovascular;  Laterality: N/A;   CERVICAL DISC SURGERY     COLONOSCOPY  09/09/2018   ICD IMPLANT N/A 08/05/2021   Procedure: ICD IMPLANT;  Surgeon: Lanier Prude, MD;  Location: [facility] INVASIVE CV LAB;  Service: Cardiovascular;  Laterality: N/A;   TONSILLECTOMY      Current Medications: Current Meds   Medication Sig   acetaminophen (TYLENOL) 650 MG CR tablet Take 650 mg by mouth every 8 (eight) hours as needed for pain.   allopurinol (ZYLOPRIM) 100 MG tablet Take 1 tablet (100 mg total) by mouth 2 (two) times daily. TAKE ONE TABLET TWICE DAILY   AMBULATORY NON FORMULARY MEDICATION Continuous positive airway pressure (CPAP) machine set on AutoPAP (4-20 cmH2O), with all supplemental supplies as needed.   amLODipine (NORVASC) 5 MG tablet TAKE 1 TABLET BY MOUTH  DAILY   apixaban (ELIQUIS) 5 MG TABS tablet Take 1 tablet (5 mg total) by mouth 2 (two) times daily.   atorvastatin (LIPITOR) 80 MG tablet Take 1 tablet (80 mg total) by mouth daily.   busPIRone (BUSPAR) 5 MG tablet Take 1-3 tablets (5-15 mg total) by mouth 2 (two) times daily as needed.   metoprolol succinate (TOPROL-XL) 25 MG 24 hr tablet TAKE 3 TABLETS BY MOUTH DAILY  WITH OR IMMEDIATELY FOLLOWING A  MEAL   sildenafil (REVATIO) 20 MG tablet Take 1-5 tablets (20-100 mg total) by mouth as needed.     Allergies:   Patient has no known allergies.   Social History   Socioeconomic History   Marital status: Married    Spouse name: Not on file   Number of children: 1   Years of education: Not on file   Highest education level: Not on file  Occupational History   Not on file  Tobacco Use   Smoking status: Never  Smokeless tobacco: Never   Tobacco comments:    Never smoke (12/24/2020)  Vaping Use   Vaping Use: Never used  Substance and Sexual Activity   Alcohol use: Yes    Alcohol/week: 2.0 standard drinks of alcohol    Types: 2 Cans of beer per week    Comment: 2 beers once a week 04/24/2021   Drug use: Never   Sexual activity: Yes  Other Topics Concern   Not on file  Social History Narrative   Full time.    Social Determinants of Health   Financial Resource Strain: Not on file  Food Insecurity: Not on file  Transportation Needs: Not on file  Physical Activity: Not on file  Stress: Not on file  Social  Connections: Not on file     Family History: The patient's family history includes Coronary artery disease in an other family member; Diabetes in an other family member; Heart attack in his father; Heart failure in his mother; Hypertension in his mother and sister. There is no history of Colon cancer, Rectal cancer, Stomach cancer, Esophageal cancer, Pancreatic cancer, or Liver cancer.  ROS:   Please see the history of present illness.    All other systems reviewed and are negative.  EKGs/Labs/Other Studies Reviewed:    The following studies were reviewed today:  11/10/2021  In clinic device interrogation personally reviewed: Battery longevity 100% Lead parameters stable 99.3% A-fib burden 4% ventricular pacing Lead outputs adjusted to maximize battery longevity   EKG:  EKG is personally reviewed.  11/10/2021:  EKG was not ordered.   Recent Labs: 09/10/2021: ALT 33 11/04/2021: BUN 21; Creatinine, Ser 1.09; Hemoglobin 15.7; Platelets 280; Potassium 4.4; Sodium 139   Recent Lipid Panel    Component Value Date/Time   CHOL 117 09/10/2021 0819   TRIG 69 09/10/2021 0819   HDL 47 09/10/2021 0819   CHOLHDL 2.5 09/10/2021 0819   CHOLHDL 2.7 04/24/2021 0854   VLDL 11 04/24/2021 0854   LDLCALC 56 09/10/2021 0819   LDLCALC 89 09/26/2020 0000    Physical Exam:    VS:  BP 118/76   Pulse 66   Ht 5\' 10"  (1.778 m)   Wt 224 lb 3.2 oz (101.7 kg)   SpO2 97%   BMI 32.17 kg/m     Wt Readings from Last 3 Encounters:  11/10/21 224 lb 3.2 oz (101.7 kg)  09/30/21 226 lb 3.2 oz (102.6 kg)  08/05/21 225 lb (102.1 kg)     GEN: Well nourished, well developed in no acute distress HEENT: Normal NECK: No JVD; No carotid bruits LYMPHATICS: No lymphadenopathy CARDIAC: Irregularly irregular, no murmurs, rubs, gallops; Device pocket well healed. RESPIRATORY:  Clear to auscultation without rales, wheezing or rhonchi  ABDOMEN: Soft, non-tender, non-distended MUSCULOSKELETAL:  No edema; No  deformity  SKIN: Warm and dry NEUROLOGIC:  Alert and oriented x 3 PSYCHIATRIC:  Normal affect        ASSESSMENT:    1. Hypertrophic obstructive cardiomyopathy (HCC)   2. Implantable cardioverter-defibrillator (ICD) in situ   3. Persistent atrial fibrillation (HCC)    PLAN:    In order of problems listed above:  #HCM #ICD in situ Device functioning approprirately. No further syncopal episodes. Significant LGE on cMR. Continue remote monitoring.   #Persistent AF Symptomatic. On eliquis for stroke ppx.   Discussed treatment options today for his AF including antiarrhythmic drug therapy and ablation. Discussed risks, recovery and likelihood of success. Discussed potential need for repeat ablation procedures and antiarrhythmic drugs  after an initial ablation. They wish to proceed with scheduling.   Risk, benefits, and alternatives to EP study and radiofrequency ablation for afib were also discussed in detail today. These risks include but are not limited to stroke, bleeding, vascular damage, tamponade, perforation, damage to the esophagus, lungs, and other structures, pulmonary vein stenosis, worsening renal function, and death. The patient understands these risk and wishes to proceed.  We will therefore proceed with catheter ablation at the next available time.  Carto, ICE, anesthesia are requested for the procedure.  Will also obtain CT PV protocol prior to the procedure to exclude LAA thrombus and further evaluate atrial anatomy.    Medication Adjustments/Labs and Tests Ordered: Current medicines are reviewed at length with the patient today.  Concerns regarding medicines are outlined above.   No orders of the defined types were placed in this encounter.  No orders of the defined types were placed in this encounter.  I,Mathew Stumpf,acting as a Neurosurgeon for Lanier Prude, MD.,have documented all relevant documentation on the behalf of Lanier Prude, MD,as directed by   Lanier Prude, MD while in the presence of Lanier Prude, MD.  I, Lanier Prude, MD, have reviewed all documentation for this visit. The documentation on 11/10/21 for the exam, diagnosis, procedures, and orders are all accurate and complete.   Signed, Steffanie Dunn, MD, [facility], [facility] 11/10/2021 2:26 PM    Electrophysiology [facility]

## 2021-11-11 ENCOUNTER — Ambulatory Visit (HOSPITAL_COMMUNITY)
Admission: RE | Admit: 2021-11-11 | Discharge: 2021-11-11 | Disposition: A | Discharge status: Home / Self Care | Payer: PRIVATE HEALTH INSURANCE | Source: Ambulatory Visit | Attending: Cardiology | Admitting: Cardiology

## 2021-11-11 ENCOUNTER — Other Ambulatory Visit: Payer: Self-pay

## 2021-11-11 ENCOUNTER — Encounter (HOSPITAL_COMMUNITY)
Admission: RE | Disposition: A | Discharge status: Home / Self Care | Payer: PRIVATE HEALTH INSURANCE | Source: Ambulatory Visit | Attending: Cardiology

## 2021-11-11 ENCOUNTER — Ambulatory Visit (HOSPITAL_BASED_OUTPATIENT_CLINIC_OR_DEPARTMENT_OTHER): Payer: PRIVATE HEALTH INSURANCE | Admitting: Certified Registered Nurse Anesthetist

## 2021-11-11 ENCOUNTER — Ambulatory Visit (HOSPITAL_COMMUNITY): Payer: PRIVATE HEALTH INSURANCE | Admitting: Certified Registered Nurse Anesthetist

## 2021-11-11 DIAGNOSIS — I4819 Other persistent atrial fibrillation: Secondary | ICD-10-CM | POA: Insufficient documentation

## 2021-11-11 DIAGNOSIS — I421 Obstructive hypertrophic cardiomyopathy: Secondary | ICD-10-CM | POA: Diagnosis not present

## 2021-11-11 DIAGNOSIS — I4891 Unspecified atrial fibrillation: Secondary | ICD-10-CM

## 2021-11-11 DIAGNOSIS — Z7901 Long term (current) use of anticoagulants: Secondary | ICD-10-CM | POA: Diagnosis not present

## 2021-11-11 DIAGNOSIS — I483 Typical atrial flutter: Secondary | ICD-10-CM | POA: Diagnosis not present

## 2021-11-11 DIAGNOSIS — Z6832 Body mass index (BMI) 32.0-32.9, adult: Secondary | ICD-10-CM

## 2021-11-11 DIAGNOSIS — Z95810 Presence of automatic (implantable) cardiac defibrillator: Secondary | ICD-10-CM | POA: Diagnosis not present

## 2021-11-11 DIAGNOSIS — I10 Essential (primary) hypertension: Secondary | ICD-10-CM

## 2021-11-11 HISTORY — PX: ATRIAL FIBRILLATION ABLATION: EP1191

## 2021-11-11 LAB — POCT ACTIVATED CLOTTING TIME
Activated Clotting Time: 329 seconds
Activated Clotting Time: 341 seconds

## 2021-11-11 SURGERY — ATRIAL FIBRILLATION ABLATION
Anesthesia: General

## 2021-11-11 MED ORDER — HEPARIN (PORCINE) IN NACL 1000-0.9 UT/500ML-% IV SOLN
INTRAVENOUS | Status: DC | PRN
  Administered 2021-11-11 (×3): 500 mL

## 2021-11-11 MED ORDER — PHENYLEPHRINE HCL-NACL 20-0.9 MG/250ML-% IV SOLN
INTRAVENOUS | Status: DC | PRN
  Administered 2021-11-11: 5 ug/min via INTRAVENOUS

## 2021-11-11 MED ORDER — HEPARIN SODIUM (PORCINE) 1000 UNIT/ML IJ SOLN
INTRAMUSCULAR | Status: AC
  Filled 2021-11-11: qty 10

## 2021-11-11 MED ORDER — ROCURONIUM BROMIDE 10 MG/ML (PF) SYRINGE
PREFILLED_SYRINGE | INTRAVENOUS | Status: DC | PRN
  Administered 2021-11-11: 20 mg via INTRAVENOUS
  Administered 2021-11-11: 10 mg via INTRAVENOUS
  Administered 2021-11-11: 70 mg via INTRAVENOUS

## 2021-11-11 MED ORDER — ACETAMINOPHEN 500 MG PO TABS
ORAL_TABLET | ORAL | Status: AC
  Administered 2021-11-11: 1000 mg
  Filled 2021-11-11: qty 2

## 2021-11-11 MED ORDER — PANTOPRAZOLE SODIUM 40 MG PO TBEC: 40.0000 mg | DELAYED_RELEASE_TABLET | Freq: Every day | ORAL | 0 refills | Status: DC

## 2021-11-11 MED ORDER — HEPARIN SODIUM (PORCINE) 1000 UNIT/ML IJ SOLN
INTRAMUSCULAR | Status: DC | PRN
  Administered 2021-11-11: 1000 [IU] via INTRAVENOUS

## 2021-11-11 MED ORDER — PROMETHAZINE HCL 25 MG/ML IJ SOLN: 6.2500 mg | INTRAMUSCULAR | Status: DC | PRN

## 2021-11-11 MED ORDER — FENTANYL CITRATE (PF) 100 MCG/2ML IJ SOLN: 25.0000 ug | INTRAMUSCULAR | Status: DC | PRN

## 2021-11-11 MED ORDER — COLCHICINE 0.6 MG PO TABS: 0.6000 mg | ORAL_TABLET | Freq: Two times a day (BID) | ORAL | 0 refills | Status: DC

## 2021-11-11 MED ORDER — CEFAZOLIN SODIUM-DEXTROSE 2-4 GM/100ML-% IV SOLN
INTRAVENOUS | Status: AC
  Filled 2021-11-11: qty 100

## 2021-11-11 MED ORDER — SODIUM CHLORIDE 0.9 % IV SOLN: INTRAVENOUS | Status: DC

## 2021-11-11 MED ORDER — OXYCODONE HCL 5 MG/5ML PO SOLN: 5.0000 mg | Freq: Once | ORAL | Status: DC | PRN

## 2021-11-11 MED ORDER — ACETAMINOPHEN 325 MG PO TABS: 650.0000 mg | ORAL_TABLET | ORAL | Status: DC | PRN

## 2021-11-11 MED ORDER — CEFAZOLIN SODIUM-DEXTROSE 2-3 GM-%(50ML) IV SOLR
INTRAVENOUS | Status: DC | PRN
  Administered 2021-11-11: 2 g via INTRAVENOUS

## 2021-11-11 MED ORDER — ONDANSETRON HCL 4 MG/2ML IJ SOLN
INTRAMUSCULAR | Status: DC | PRN
  Administered 2021-11-11: 4 mg via INTRAVENOUS

## 2021-11-11 MED ORDER — PROTAMINE SULFATE 10 MG/ML IV SOLN
INTRAVENOUS | Status: DC | PRN
  Administered 2021-11-11: 30 mg via INTRAVENOUS

## 2021-11-11 MED ORDER — ONDANSETRON HCL 4 MG/2ML IJ SOLN: 4.0000 mg | Freq: Four times a day (QID) | INTRAMUSCULAR | Status: DC | PRN

## 2021-11-11 MED ORDER — SODIUM CHLORIDE 0.9 % IV SOLN: 250.0000 mL | INTRAVENOUS | Status: DC | PRN

## 2021-11-11 MED ORDER — HEPARIN (PORCINE) IN NACL 1000-0.9 UT/500ML-% IV SOLN
INTRAVENOUS | Status: AC
Start: 2021-11-11 — End: ?
  Filled 2021-11-11: qty 1500

## 2021-11-11 MED ORDER — MEPERIDINE HCL 25 MG/ML IJ SOLN: 6.2500 mg | INTRAMUSCULAR | Status: DC | PRN

## 2021-11-11 MED ORDER — PROPOFOL 10 MG/ML IV BOLUS
INTRAVENOUS | Status: DC | PRN
  Administered 2021-11-11: 200 mg via INTRAVENOUS

## 2021-11-11 MED ORDER — COLCHICINE 0.6 MG PO TABS
0.6000 mg | ORAL_TABLET | Freq: Two times a day (BID) | ORAL | Status: DC
  Administered 2021-11-11: 0.6 mg via ORAL
  Filled 2021-11-11: qty 1

## 2021-11-11 MED ORDER — LIDOCAINE 2% (20 MG/ML) 5 ML SYRINGE
INTRAMUSCULAR | Status: DC | PRN
  Administered 2021-11-11: 30 mg via INTRAVENOUS

## 2021-11-11 MED ORDER — EPHEDRINE SULFATE-NACL 50-0.9 MG/10ML-% IV SOSY
PREFILLED_SYRINGE | INTRAVENOUS | Status: DC | PRN
  Administered 2021-11-11: 5 mg via INTRAVENOUS

## 2021-11-11 MED ORDER — PANTOPRAZOLE SODIUM 40 MG PO TBEC
40.0000 mg | DELAYED_RELEASE_TABLET | Freq: Every day | ORAL | Status: DC
  Administered 2021-11-11: 40 mg via ORAL
  Filled 2021-11-11: qty 1

## 2021-11-11 MED ORDER — DEXAMETHASONE SODIUM PHOSPHATE 10 MG/ML IJ SOLN
INTRAMUSCULAR | Status: DC | PRN
  Administered 2021-11-11: 8 mg via INTRAVENOUS

## 2021-11-11 MED ORDER — OXYCODONE HCL 5 MG PO TABS: 5.0000 mg | ORAL_TABLET | Freq: Once | ORAL | Status: DC | PRN

## 2021-11-11 MED ORDER — MIDAZOLAM HCL 2 MG/2ML IJ SOLN: 0.5000 mg | Freq: Once | INTRAMUSCULAR | Status: DC | PRN

## 2021-11-11 MED ORDER — HEPARIN SODIUM (PORCINE) 1000 UNIT/ML IJ SOLN
INTRAMUSCULAR | Status: DC | PRN
  Administered 2021-11-11: 16000 [IU] via INTRAVENOUS
  Administered 2021-11-11: 3000 [IU] via INTRAVENOUS

## 2021-11-11 MED ORDER — SODIUM CHLORIDE 0.9% FLUSH: 3.0000 mL | INTRAVENOUS | Status: DC | PRN

## 2021-11-11 MED ORDER — FENTANYL CITRATE (PF) 250 MCG/5ML IJ SOLN
INTRAMUSCULAR | Status: DC | PRN
  Administered 2021-11-11: 100 ug via INTRAVENOUS

## 2021-11-11 MED ORDER — SODIUM CHLORIDE 0.9% FLUSH: 3.0000 mL | Freq: Two times a day (BID) | INTRAVENOUS | Status: DC

## 2021-11-11 MED ORDER — APIXABAN 5 MG PO TABS
5.0000 mg | ORAL_TABLET | Freq: Two times a day (BID) | ORAL | Status: DC
  Administered 2021-11-11: 5 mg via ORAL
  Filled 2021-11-11 (×2): qty 1

## 2021-11-11 SURGICAL SUPPLY — 16 items
CATH 8FR REPROCESSED SOUNDSTAR (CATHETERS) ×2 IMPLANT
CATH 8FR SOUNDSTAR REPROCESSED (CATHETERS) IMPLANT
CATH ABLAT QDOT MICRO BI TC DF (CATHETERS) ×1 IMPLANT
CATH OCTARAY 2.0 F 3-3-3-3-3 (CATHETERS) ×1 IMPLANT
CATH WEB BI DIR CSDF CRV REPRO (CATHETERS) ×1 IMPLANT
CLOSURE PERCLOSE PROSTYLE (VASCULAR PRODUCTS) ×3 IMPLANT
COVER SWIFTLINK CONNECTOR (BAG) ×2 IMPLANT
PACK EP LATEX FREE (CUSTOM PROCEDURE TRAY) ×2
PACK EP LF (CUSTOM PROCEDURE TRAY) ×1 IMPLANT
PAD DEFIB RADIO PHYSIO CONN (PAD) ×2 IMPLANT
SHEATH BAYLIS TRANSSEPTAL 98CM (NEEDLE) ×1 IMPLANT
SHEATH CARTO VIZIGO SM CVD (SHEATH) ×1 IMPLANT
SHEATH PINNACLE 8F 10CM (SHEATH) ×2 IMPLANT
SHEATH PINNACLE 9F 10CM (SHEATH) ×1 IMPLANT
SHEATH PROBE COVER 6X72 (BAG) ×1 IMPLANT
TUBING SMART ABLATE COOLFLOW (TUBING) ×1 IMPLANT

## 2021-11-11 NOTE — Anesthesia Postprocedure Evaluation (Signed)
Anesthesia Post Note  Patient: Dale Rivera  Procedure(s) Performed: ATRIAL FIBRILLATION ABLATION     Patient location during evaluation: Phase II Anesthesia Type: General Level of consciousness: awake and alert, patient cooperative and oriented Pain management: pain level controlled Vital Signs Assessment: post-procedure vital signs reviewed and stable Respiratory status: nonlabored ventilation, spontaneous breathing and respiratory function stable Cardiovascular status: blood pressure returned to baseline and stable Postop Assessment: no apparent nausea or vomiting Anesthetic complications: no   There were no known notable events for this encounter.  Last Vitals:  Vitals:   11/11/21 1350 11/11/21 1418  BP: 130/69 120/73  Pulse: 74 71  Resp: 17 18  Temp:  (!) 36.2 C  SpO2: 97% 97%    Last Pain:  Vitals:   11/11/21 1439  TempSrc:   PainSc: 0-No pain                 Warden Buffa,E. Seith Aikey

## 2021-11-11 NOTE — Discharge Instructions (Signed)

## 2021-11-11 NOTE — Anesthesia Procedure Notes (Signed)
Procedure Name: Intubation Date/Time: 11/11/2021 11:14 AM  Performed by: Leonor Liv, CRNAPre-anesthesia Checklist: Patient identified, Emergency Drugs available, Suction available and Patient being monitored Patient Re-evaluated:Patient Re-evaluated prior to induction Oxygen Delivery Method: Circle System Utilized Preoxygenation: Pre-oxygenation with 100% oxygen Induction Type: IV induction Ventilation: Mask ventilation without difficulty and Oral airway inserted - appropriate to patient size Laryngoscope Size: Mac and 4 Grade View: Grade II Tube type: Oral Tube size: 7.5 mm Number of attempts: 1 Airway Equipment and Method: Stylet and Oral airway Placement Confirmation: ETT inserted through vocal cords under direct vision, positive ETCO2 and breath sounds checked- equal and bilateral Secured at: 23 cm Tube secured with: Tape Dental Injury: Teeth and Oropharynx as per pre-operative assessment

## 2021-11-11 NOTE — Transfer of Care (Signed)
Immediate Anesthesia Transfer of Care Note  Patient: Dale Rivera  Procedure(s) Performed: ATRIAL FIBRILLATION ABLATION  Patient Location: Cath Lab  Anesthesia Type:General  Level of Consciousness: awake, alert  and oriented  Airway & Oxygen Therapy: Patient Spontanous Breathing and Patient connected to nasal cannula oxygen  Post-op Assessment: Report given to RN, Post -op Vital signs reviewed and stable and Patient moving all extremities  Post vital signs: Reviewed and stable  Last Vitals:  Vitals Value Taken Time  BP 126/63 11/11/21 1345  Temp 36.3 C 11/11/21 1344  Pulse 77 11/11/21 1349  Resp 17 11/11/21 1349  SpO2 96 % 11/11/21 1349  Vitals shown include unvalidated device data.  Last Pain:  Vitals:   11/11/21 1344  TempSrc: Temporal  PainSc: 0-No pain         Complications: There were no known notable events for this encounter.

## 2021-11-11 NOTE — Anesthesia Preprocedure Evaluation (Addendum)
Anesthesia Evaluation  Patient identified by MRN, date of birth, ID band Patient awake    Reviewed: Allergy & Precautions, NPO status , Patient's Chart, lab work & pertinent test results, reviewed documented beta blocker date and time   History of Anesthesia Complications Negative for: history of anesthetic complications  Airway Mallampati: II  TM Distance: >3 FB Neck ROM: Full    Dental  (+) Dental Advisory Given   Pulmonary neg pulmonary ROS,    breath sounds clear to auscultation       Cardiovascular hypertension, Pt. on medications and Pt. on home beta blockers (-) angina+ Cardiac Defibrillator (device has never delivered shock to pt)  Rhythm:Irregular Rate:Normal  03/2021 Stress:  Normal study without evidence of ischemia or infarction.  LVEF visually is normal but was computed at 33% due to cavity obliteration. Would confirm with an echocardiogram.  This is a low-risk study based on perfusion  05/2020 ECHO: EF 55-60%. The LV has normal function,  no regional wall motion abnormalities. There is severe concentric left ventricular hypertrophy. Increased LVOT gradient 32 mmHG with valsalva. Intracavitary obliteration is noted. No systolic anterior motion of th emitral valve, no significant valvular abnormalities   Neuro/Psych negative neurological ROS     GI/Hepatic negative GI ROS, Neg liver ROS,   Endo/Other  Morbid obesity  Renal/GU negative Renal ROS     Musculoskeletal   Abdominal (+) + obese,   Peds  Hematology Eliquis   Anesthesia Other Findings   Reproductive/Obstetrics                            Anesthesia Physical Anesthesia Plan  ASA: 3  Anesthesia Plan: General   Post-op Pain Management: Tylenol PO (pre-op)*   Induction: Intravenous  PONV Risk Score and Plan: 2 and Ondansetron, Dexamethasone and Treatment may vary due to age or medical condition  Airway Management  Planned: Oral ETT  Additional Equipment: None  Intra-op Plan:   Post-operative Plan: Extubation in OR  Informed Consent: I have reviewed the patients History and Physical, chart, labs and discussed the procedure including the risks, benefits and alternatives for the proposed anesthesia with the patient or authorized representative who has indicated his/her understanding and acceptance.     Dental advisory given  Plan Discussed with: CRNA and Surgeon  Anesthesia Plan Comments:        Anesthesia Quick Evaluation

## 2021-11-11 NOTE — Interval H&P Note (Signed)
History and Physical Interval Note:  11/11/2021 10:31 AM  Dale Rivera  has presented today for surgery, with the diagnosis of afib.  The various methods of treatment have been discussed with the patient and family. After consideration of risks, benefits and other options for treatment, the patient has consented to  Procedure(s): ATRIAL FIBRILLATION ABLATION (N/A) as a surgical intervention.  The patient's history has been reviewed, patient examined, no change in status, stable for surgery.  I have reviewed the patient's chart and labs.  Questions were answered to the patient's satisfaction.     Tamatha Gadbois T Elio Haden

## 2021-11-12 ENCOUNTER — Encounter (HOSPITAL_COMMUNITY): Payer: Self-pay | Admitting: Cardiology

## 2021-11-13 ENCOUNTER — Encounter: Payer: Self-pay | Admitting: Cardiology

## 2021-11-13 ENCOUNTER — Other Ambulatory Visit (HOSPITAL_COMMUNITY): Payer: Self-pay

## 2021-11-13 NOTE — Telephone Encounter (Signed)
Opened in error

## 2021-11-18 ENCOUNTER — Other Ambulatory Visit: Payer: Self-pay | Admitting: Medical-Surgical

## 2021-11-18 DIAGNOSIS — M1A9XX0 Chronic gout, unspecified, without tophus (tophi): Secondary | ICD-10-CM

## 2021-11-19 NOTE — Telephone Encounter (Signed)
Last office visit 09/30/2021  Last filled 09/26/2020

## 2021-11-27 NOTE — Progress Notes (Signed)
Remote ICD transmission.   

## 2021-12-03 NOTE — Progress Notes (Signed)
HPI: FU hypertrophic cardiomyopathy. Echo repeated 2/22 showed normal LV function, severe LVH, LVOT gradient of 32 mmHg with valsalva, mild LAE. Also with atrial fibrillation. Nuclear study December 2022 showed normal perfusion and ejection fraction 33% but visually was normal. Cardiac MRI April 2023 showed severe asymmetric left ventricular hypertrophy with septum measuring 23 mm.  Cardiac CTA August 2023 showed calcium score 664 which was 90th percentile.  Patient had ICD implant May 2023.  He had atrial fibrillation ablation August 2023.  Since I last saw him, he feels much better following his atrial fibrillation ablation.  He denies dyspnea, chest pain, palpitations, syncope or bleeding.  Current Outpatient Medications  Medication Sig Dispense Refill   acetaminophen (TYLENOL) 650 MG CR tablet Take 650 mg by mouth every 8 (eight) hours as needed for pain.     allopurinol (ZYLOPRIM) 100 MG tablet TAKE 1 TABLET BY MOUTH  TWICE DAILY 180 tablet 3   AMBULATORY NON FORMULARY MEDICATION Continuous positive airway pressure (CPAP) machine set on AutoPAP (4-20 cmH2O), with all supplemental supplies as needed. 1 each 0   amLODipine (NORVASC) 5 MG tablet TAKE 1 TABLET BY MOUTH  DAILY 90 tablet 3   apixaban (ELIQUIS) 5 MG TABS tablet Take 1 tablet (5 mg total) by mouth 2 (two) times daily. 180 tablet 3   atorvastatin (LIPITOR) 80 MG tablet Take 1 tablet (80 mg total) by mouth daily. 90 tablet 3   busPIRone (BUSPAR) 5 MG tablet Take 1-3 tablets (5-15 mg total) by mouth 2 (two) times daily as needed. 60 tablet 0   metoprolol succinate (TOPROL-XL) 25 MG 24 hr tablet TAKE 3 TABLETS BY MOUTH DAILY  WITH OR IMMEDIATELY FOLLOWING A  MEAL 270 tablet 1   sildenafil (REVATIO) 20 MG tablet Take 1-5 tablets (20-100 mg total) by mouth as needed. 50 tablet 11   No current facility-administered medications for this visit.     Past Medical History:  Diagnosis Date   Atrial fibrillation (HCC)    Cardiomyopathy     hypertrophic   Gout    HYPERLIPIDEMIA 07/02/2009   Qualifier: Diagnosis of  By: Jens Som, MD, [place] Hollingshead    Hypertension    Hypertrophic obstructive cardiomyopathy (HCC) 07/03/2009   Qualifier: Diagnosis of  By: Jens Som, MD, [place] Hollingshead    Lower extremity edema 11/15/2013   Palpitations 09/18/2009   Qualifier: Diagnosis of  By: Jens Som, MD, [place] Hollingshead    SYNCOPE 07/02/2009   Qualifier: Diagnosis of  By: Jens Som, MD, [place] Hollingshead     Past Surgical History:  Procedure Laterality Date   ATRIAL FIBRILLATION ABLATION N/A 11/11/2021   Procedure: ATRIAL FIBRILLATION ABLATION;  Surgeon: Lanier Prude, MD;  Location: [facility] INVASIVE CV LAB;  Service: Cardiovascular;  Laterality: N/A;   CARDIOVERSION N/A 05/08/2021   Procedure: CARDIOVERSION;  Surgeon: Chilton Si, MD;  Location: [facility] ENDOSCOPY;  Service: Cardiovascular;  Laterality: N/A;   CERVICAL DISC SURGERY     COLONOSCOPY  09/09/2018   ICD IMPLANT N/A 08/05/2021   Procedure: ICD IMPLANT;  Surgeon: Lanier Prude, MD;  Location: [facility] INVASIVE CV LAB;  Service: Cardiovascular;  Laterality: N/A;   TONSILLECTOMY      Social History   Socioeconomic History   Marital status: Married    Spouse name: Not on file   Number of children: 1   Years of education: Not on file   Highest education level: Not on file  Occupational History   Not on file  Tobacco Use   Smoking status: Never   Smokeless tobacco: Never   Tobacco comments:    Never smoke (12/24/2020)  Vaping Use   Vaping Use: Never used  Substance and Sexual Activity   Alcohol use: Yes    Alcohol/week: 2.0 standard drinks of alcohol    Types: 2 Cans of beer per week    Comment: 2 beers once a week 04/24/2021   Drug use: Never   Sexual activity: Yes  Other Topics Concern   Not on file  Social History Narrative   Full time.    Social Determinants of Health   Financial Resource Strain: Not on file  Food Insecurity: Not on  file  Transportation Needs: Not on file  Physical Activity: Not on file  Stress: Not on file  Social Connections: Not on file  Intimate Partner Violence: Not on file    Family History  Problem Relation Age of Onset   Hypertension Mother    Heart failure Mother    Heart attack Father    Hypertension Sister    Coronary artery disease Other    Diabetes Other        siblings   Colon cancer Neg Hx    Rectal cancer Neg Hx    Stomach cancer Neg Hx    Esophageal cancer Neg Hx    Pancreatic cancer Neg Hx    Liver cancer Neg Hx     ROS: no fevers or chills, productive cough, hemoptysis, dysphasia, odynophagia, melena, hematochezia, dysuria, hematuria, rash, seizure activity, orthopnea, PND, pedal edema, claudication. Remaining systems are negative.  Physical Exam: Well-developed well-nourished in no acute distress.  Skin is warm and dry.  HEENT is normal.  Neck is supple.  Chest is clear to auscultation with normal expansion.  ICD site without infection. Cardiovascular exam is regular rate and rhythm.  Abdominal exam nontender or distended. No masses palpated. Extremities show no edema. neuro grossly intact   A/P  1 paroxysmal atrial fibrillation-patient is status post ablation and remains in sinus rhythm.  We will continue beta-blocker and apixaban.  2 hypertrophic cardiomyopathy-continue beta-blocker.  He is scheduled for genetic testing evaluation.  He understands that his daughter should be screened in the future.  3 status post ICD-follow-up with electrophysiology.  4 hypertension-patient's blood pressure is controlled.  Continue present medications and follow-up.  5 elevated calcium score-previous nuclear study showed no ischemia.  Continue statin.  Patient is not on aspirin given need for anticoagulation.  6 hyperlipidemia-continue Lipitor.  7 palpitations-continue beta-blocker.  Olga Millers, MD

## 2021-12-09 ENCOUNTER — Encounter (HOSPITAL_COMMUNITY): Payer: Self-pay | Admitting: Physician Assistant

## 2021-12-09 ENCOUNTER — Ambulatory Visit (HOSPITAL_COMMUNITY)
Admission: RE | Admit: 2021-12-09 | Discharge: 2021-12-09 | Disposition: A | Discharge status: Home / Self Care | Payer: PRIVATE HEALTH INSURANCE | Source: Ambulatory Visit | Attending: Physician Assistant | Admitting: Physician Assistant

## 2021-12-09 VITALS — BP 132/80 | HR 73 | Wt 223.0 lb

## 2021-12-09 DIAGNOSIS — Z6832 Body mass index (BMI) 32.0-32.9, adult: Secondary | ICD-10-CM | POA: Diagnosis not present

## 2021-12-09 DIAGNOSIS — I10 Essential (primary) hypertension: Secondary | ICD-10-CM | POA: Insufficient documentation

## 2021-12-09 DIAGNOSIS — Z95810 Presence of automatic (implantable) cardiac defibrillator: Secondary | ICD-10-CM | POA: Diagnosis not present

## 2021-12-09 DIAGNOSIS — Z8249 Family history of ischemic heart disease and other diseases of the circulatory system: Secondary | ICD-10-CM | POA: Insufficient documentation

## 2021-12-09 DIAGNOSIS — Z7901 Long term (current) use of anticoagulants: Secondary | ICD-10-CM | POA: Diagnosis not present

## 2021-12-09 DIAGNOSIS — E785 Hyperlipidemia, unspecified: Secondary | ICD-10-CM | POA: Insufficient documentation

## 2021-12-09 DIAGNOSIS — I421 Obstructive hypertrophic cardiomyopathy: Secondary | ICD-10-CM | POA: Insufficient documentation

## 2021-12-09 DIAGNOSIS — G4733 Obstructive sleep apnea (adult) (pediatric): Secondary | ICD-10-CM | POA: Insufficient documentation

## 2021-12-09 DIAGNOSIS — Z79899 Other long term (current) drug therapy: Secondary | ICD-10-CM | POA: Insufficient documentation

## 2021-12-09 DIAGNOSIS — I4892 Unspecified atrial flutter: Secondary | ICD-10-CM | POA: Diagnosis not present

## 2021-12-09 DIAGNOSIS — I4819 Other persistent atrial fibrillation: Secondary | ICD-10-CM | POA: Diagnosis not present

## 2021-12-09 DIAGNOSIS — E669 Obesity, unspecified: Secondary | ICD-10-CM | POA: Diagnosis not present

## 2021-12-09 NOTE — Progress Notes (Addendum)
Primary Care Physician: Christen Butter, NP Primary Cardiologist: Dr Jens Som Primary Electrophysiologist: Dr Lalla Brothers Referring Physician: Dr Olga Millers Dale Rivera is a 61 y.o. male with a history of hypertrophic CM, HTN, HLD, OSA, atrial fibrillation who presents for follow up in the [facility]. The patient was initially diagnosed with atrial fibrillation 04/24/20 after presenting to Dr Ludwig Clarks office for a routine follow up. Patient was asymptomatic. Patient is on Eliquis for a CHADS2VASC score of 1. He reports that his afib is paroxysmal. He has a Optician, dispensing device which he checks daily. At his visit 12/18/20 he had been in afib for several days and did have symptoms of increased fatigue. He is s/p DCCV on 05/08/21 but had quick return of his afib. He was seen by Dr Lalla Brothers and had ICD implanted for his HOCM. He is s/p afib and flutter ablation with Dr Lalla Brothers on 11/11/21.   On follow up today, patient reports that he has done very well since the ablation. He checks his Lourena Simmonds mobile frequently which has shown SR. He feels much more energetic in SR. No CP, swallowing pain, or groin issues.  Today, he denies symptoms of palpitations, chest pain, orthopnea, PND, lower extremity edema, dizziness, presyncope, syncope, bleeding, or neurologic sequela. The patient is tolerating medications without difficulties and is otherwise without complaint today.    Atrial Fibrillation Risk Factors:  he does have symptoms or diagnosis of sleep apnea. he is is compliant with CPAP. he does not have a history of rheumatic fever. he does not have a history of alcohol use. The patient does have a history of early familial atrial fibrillation or other arrhythmias. Mother  he has a BMI of Body mass index is 32 kg/m.Marland Kitchen Filed Weights   12/09/21 1128  Weight: 101.2 kg    Family History  Problem Relation Age of Onset   Hypertension Mother    Heart failure Mother    Heart attack Father     Hypertension Sister    Coronary artery disease Other    Diabetes Other        siblings   Colon cancer Neg Hx    Rectal cancer Neg Hx    Stomach cancer Neg Hx    Esophageal cancer Neg Hx    Pancreatic cancer Neg Hx    Liver cancer Neg Hx      Atrial Fibrillation Management history:  Previous antiarrhythmic drugs: none Previous cardioversions: 05/08/21 Previous ablations: 11/11/21 CHADS2VASC score: 1 Anticoagulation history: Eliquis   Past Medical History:  Diagnosis Date   Atrial fibrillation (HCC)    Cardiomyopathy    hypertrophic   Gout    HYPERLIPIDEMIA 07/02/2009   Qualifier: Diagnosis of  By: Jens Som, MD, [place] Hollingshead    Hypertension    Hypertrophic obstructive cardiomyopathy (HCC) 07/03/2009   Qualifier: Diagnosis of  By: Jens Som, MD, Lyn Hollingshead    Lower extremity edema 11/15/2013   Palpitations 09/18/2009   Qualifier: Diagnosis of  By: Jens Som, MD, Lyn Hollingshead    SYNCOPE 07/02/2009   Qualifier: Diagnosis of  By: Jens Som, MD, Lyn Hollingshead    Past Surgical History:  Procedure Laterality Date   ATRIAL FIBRILLATION ABLATION N/A 11/11/2021   Procedure: ATRIAL FIBRILLATION ABLATION;  Surgeon: Lanier Prude, MD;  Location: [facility] INVASIVE CV LAB;  Service: Cardiovascular;  Laterality: N/A;   CARDIOVERSION N/A 05/08/2021   Procedure: CARDIOVERSION;  Surgeon: Chilton Si, MD;  Location: [facility] ENDOSCOPY;  Service: Cardiovascular;  Laterality:  N/A;   CERVICAL DISC SURGERY     COLONOSCOPY  09/09/2018   ICD IMPLANT N/A 08/05/2021   Procedure: ICD IMPLANT;  Surgeon: Lanier Prude, MD;  Location: [facility] INVASIVE CV LAB;  Service: Cardiovascular;  Laterality: N/A;   TONSILLECTOMY      Current Outpatient Medications  Medication Sig Dispense Refill   acetaminophen (TYLENOL) 650 MG CR tablet Take 650 mg by mouth every 8 (eight) hours as needed for pain.     allopurinol (ZYLOPRIM) 100 MG tablet TAKE 1 TABLET BY MOUTH  TWICE DAILY 180  tablet 3   AMBULATORY NON FORMULARY MEDICATION Continuous positive airway pressure (CPAP) machine set on AutoPAP (4-20 cmH2O), with all supplemental supplies as needed. 1 each 0   amLODipine (NORVASC) 5 MG tablet TAKE 1 TABLET BY MOUTH  DAILY 90 tablet 3   apixaban (ELIQUIS) 5 MG TABS tablet Take 1 tablet (5 mg total) by mouth 2 (two) times daily. 180 tablet 3   atorvastatin (LIPITOR) 80 MG tablet Take 1 tablet (80 mg total) by mouth daily. 90 tablet 3   busPIRone (BUSPAR) 5 MG tablet Take 1-3 tablets (5-15 mg total) by mouth 2 (two) times daily as needed. 60 tablet 0   metoprolol succinate (TOPROL-XL) 25 MG 24 hr tablet TAKE 3 TABLETS BY MOUTH DAILY  WITH OR IMMEDIATELY FOLLOWING A  MEAL 270 tablet 1   sildenafil (REVATIO) 20 MG tablet Take 1-5 tablets (20-100 mg total) by mouth as needed. 50 tablet 11   No current facility-administered medications for this encounter.    No Known Allergies  Social History   Socioeconomic History   Marital status: Married    Spouse name: Not on file   Number of children: 1   Years of education: Not on file   Highest education level: Not on file  Occupational History   Not on file  Tobacco Use   Smoking status: Never   Smokeless tobacco: Never   Tobacco comments:    Never smoke (12/24/2020)  Vaping Use   Vaping Use: Never used  Substance and Sexual Activity   Alcohol use: Yes    Alcohol/week: 2.0 standard drinks of alcohol    Types: 2 Cans of beer per week    Comment: 2 beers once a week 04/24/2021   Drug use: Never   Sexual activity: Yes  Other Topics Concern   Not on file  Social History Narrative   Full time.    Social Determinants of Health   Financial Resource Strain: Not on file  Food Insecurity: Not on file  Transportation Needs: Not on file  Physical Activity: Not on file  Stress: Not on file  Social Connections: Not on file  Intimate Partner Violence: Not on file     ROS- All systems are reviewed and negative except as  per the HPI above.  Physical Exam: Vitals:   12/09/21 1128  BP: 132/80  Pulse: 73  Weight: 101.2 kg    GEN- The patient is a well appearing obese male, alert and oriented x 3 today.   HEENT-head normocephalic, atraumatic, sclera clear, conjunctiva pink, hearing intact, trachea midline. Lungs- Clear to ausculation bilaterally, normal work of breathing Heart- Regular rate and rhythm, no murmurs, rubs or gallops  GI- soft, NT, ND, + BS Extremities- no clubbing, cyanosis, or edema MS- no significant deformity or atrophy Skin- no rash or lesion Psych- euthymic mood, full affect Neuro- strength and sensation are intact   Wt Readings from Last 3 Encounters:  12/09/21 101.2 kg  11/11/21 102.1 kg  11/10/21 101.7 kg    EKG today demonstrates  SR, LVH with repol changes, PACs Vent. rate 73 BPM PR interval 208 ms QRS duration 88 ms QT/QTcB 430/473 ms  Echo 05/20/20 demonstrated  1. Left ventricular ejection fraction, by estimation, is 55 to 60%. The  left ventricle has normal function. The left ventricle has no regional  wall motion abnormalities. There is severe concentric left ventricular  hypertrophy. Increased LVOT gradient 32  mmHG with valsalva. Intracavitary obliteration is noted. No systolic anterior motion. Left ventricular diastolic parameters are indeterminate.   2. Right ventricular systolic function is normal. The right ventricular  size is normal.   3. Left atrial size was mildly dilated.   4. The mitral valve is normal in structure. No evidence of mitral valve regurgitation. No evidence of mitral stenosis.   5. The aortic valve is normal in structure. Aortic valve regurgitation is not visualized. No aortic stenosis is present.   6. Unable to determine pulmonary artery systolic pressure, No TR jet spectral display.   Epic records are reviewed at length today  CHA2DS2-VASc Score = 1  The patient's score is based upon: CHF History: 0 HTN History: 1 Diabetes  History: 0 Stroke History: 0 Vascular Disease History: 0 Age Score: 0 Gender Score: 0       ASSESSMENT AND PLAN: 1. Persistent atrial fibrillation/atrial flutter The patient's CHA2DS2-VASc score is 1, indicating a 0.6% annual risk of stroke.   S/p afib and flutter ablation 11/11/21 Patient appears to be maintaining SR. Kardia for home monitoring.  Continue Eliquis 5 mg BID with no missed doses for 3 months post ablation. Anticoagulation indicated given HCM even with low CV score.  Continue Toprol 75 mg daily  2. Obesity Body mass index is 32 kg/m. Lifestyle modification was discussed and encouraged including weight reduction.  3. Obstructive sleep apnea Encouraged compliance with CPAP therapy.  4. HOCM S/p ICD, followed by Dr Quentin Ore and device clinic. Continue BB  5. HTN Stable, no changes today.   Follow up with Dr Stanford Breed and Dr Quentin Ore as scheduled.    [facility] [address] [place], [place] [address] [phone] 12/09/2021 11:44 AM

## 2021-12-17 ENCOUNTER — Ambulatory Visit: Payer: PRIVATE HEALTH INSURANCE | Attending: Cardiology | Admitting: Cardiology

## 2021-12-17 ENCOUNTER — Encounter: Payer: Self-pay | Admitting: Cardiology

## 2021-12-17 VITALS — BP 130/84 | HR 67 | Ht 70.0 in | Wt 222.8 lb

## 2021-12-17 DIAGNOSIS — I2510 Atherosclerotic heart disease of native coronary artery without angina pectoris: Secondary | ICD-10-CM | POA: Diagnosis not present

## 2021-12-17 DIAGNOSIS — I480 Paroxysmal atrial fibrillation: Secondary | ICD-10-CM

## 2021-12-17 DIAGNOSIS — E7800 Pure hypercholesterolemia, unspecified: Secondary | ICD-10-CM

## 2021-12-17 DIAGNOSIS — I421 Obstructive hypertrophic cardiomyopathy: Secondary | ICD-10-CM | POA: Diagnosis not present

## 2021-12-17 DIAGNOSIS — I10 Essential (primary) hypertension: Secondary | ICD-10-CM

## 2021-12-17 MED ORDER — AMLODIPINE BESYLATE 5 MG PO TABS: 5.0000 mg | ORAL_TABLET | Freq: Every day | ORAL | 3 refills | Status: DC

## 2021-12-17 NOTE — Patient Instructions (Signed)
  Follow-Up: At [facility], you and your health needs are our priority.  As part of our continuing mission to provide you with exceptional heart care, we have created designated Provider Care Teams.  These Care Teams include your primary Cardiologist (physician) and Advanced Practice Providers (APPs -  Physician Assistants and Nurse Practitioners) who all work together to provide you with the care you need, when you need it.  We recommend signing up for the patient portal called "MyChart".  Sign up information is provided on this After Visit Summary.  MyChart is used to connect with patients for Virtual Visits (Telemedicine).  Patients are able to view lab/test results, encounter notes, upcoming appointments, etc.  Non-urgent messages can be sent to your provider as well.   To learn more about what you can do with MyChart, go to [email or web address].    Your next appointment:   6 month(s)  The format for your next appointment:   In Person  Provider:   Tavish Crenshaw, MD    

## 2021-12-21 ENCOUNTER — Other Ambulatory Visit: Payer: Self-pay | Admitting: Cardiology

## 2021-12-21 DIAGNOSIS — I421 Obstructive hypertrophic cardiomyopathy: Secondary | ICD-10-CM

## 2022-01-07 ENCOUNTER — Encounter: Payer: Self-pay | Admitting: Cardiology

## 2022-01-07 DIAGNOSIS — E7800 Pure hypercholesterolemia, unspecified: Secondary | ICD-10-CM

## 2022-01-08 MED ORDER — ATORVASTATIN CALCIUM 80 MG PO TABS: 80.0000 mg | ORAL_TABLET | Freq: Every day | ORAL | 3 refills | Status: DC

## 2022-01-09 ENCOUNTER — Telehealth: Payer: Self-pay

## 2022-01-09 NOTE — Telephone Encounter (Signed)
-----   Message from Horris Latino, [place] sent at 12/18/2021  2:46 PM EDT ----- Regarding: FW: [place] EGD and colonoscopy possibly  ----- Message ----- From: Villa Herb, CMA Sent: 12/05/2021   8:00 AM EDT To: Villa Herb, CMA Subject: [place] EGD and colonoscopy possibly          Need to see how patient is feeling will have ICD implant 5-2 and Afib ablation 8-8.  Will call in September/October to see if he would like to see about getting him cleared for possible EGD/colon  Dr Lurline Del patient

## 2022-01-09 NOTE — Telephone Encounter (Signed)
LVM for patient to call back to see how he is doing and also he wanted to procedure with schedule an endoscopy procedure.

## 2022-01-22 NOTE — Telephone Encounter (Signed)
LVM and letter sent.

## 2022-02-05 ENCOUNTER — Ambulatory Visit (INDEPENDENT_AMBULATORY_CARE_PROVIDER_SITE_OTHER): Payer: PRIVATE HEALTH INSURANCE

## 2022-02-05 DIAGNOSIS — I421 Obstructive hypertrophic cardiomyopathy: Secondary | ICD-10-CM

## 2022-02-05 LAB — CUP PACEART REMOTE DEVICE CHECK
Date Time Interrogation Session: 20231102080922
Implantable Lead Connection Status: 753985
Implantable Lead Implant Date: 20230502
Implantable Lead Location: 753860
Implantable Lead Model: 436909
Implantable Lead Serial Number: 81487767
Implantable Pulse Generator Implant Date: 20230502
Pulse Gen Model: 429525
Pulse Gen Serial Number: 84895165

## 2022-02-16 NOTE — Progress Notes (Signed)
Remote ICD transmission.   

## 2022-02-17 ENCOUNTER — Encounter: Payer: PRIVATE HEALTH INSURANCE | Admitting: Genetic Counselor

## 2022-03-15 NOTE — Progress Notes (Deleted)
Electrophysiology Office Follow up Visit Note:    Date:  03/15/2022   ID:  Dale Rivera, DOB 07/03/1960, MRN [id number]  PCP:  Samuel Bouche, NP  [facility] Cardiologist:  Kirk Ruths, MD  [facility] Electrophysiologist:  Vickie Epley, MD    Interval History:    Dale Rivera is a 61 y.o. male who presents for a follow up visit. He had an AF ablation 11/11/2021. During the ablation, the veins and CTI were ablated. He saw Audry Pili 12/09/2021 and was doing well maintaining sinus rhythm. He is on eliquis for stroke ppx.         Past Medical History:  Diagnosis Date   Atrial fibrillation ([place])    Cardiomyopathy    hypertrophic   Gout    HYPERLIPIDEMIA 07/02/2009   Qualifier: Diagnosis of  By: Stanford Breed, MD, Kandyce Rud    Hypertension    Hypertrophic obstructive cardiomyopathy ([place]) 07/03/2009   Qualifier: Diagnosis of  By: Stanford Breed, MD, Kandyce Rud    Lower extremity edema 11/15/2013   Palpitations 09/18/2009   Qualifier: Diagnosis of  By: Stanford Breed, MD, FACC, [place] 07/02/2009   Qualifier: Diagnosis of  By: Stanford Breed, MD, Kandyce Rud     Past Surgical History:  Procedure Laterality Date   ATRIAL FIBRILLATION ABLATION N/A 11/11/2021   Procedure: ATRIAL FIBRILLATION ABLATION;  Surgeon: Vickie Epley, MD;  Location: [place] CV LAB;  Service: Cardiovascular;  Laterality: N/A;   CARDIOVERSION N/A 05/08/2021   Procedure: CARDIOVERSION;  Surgeon: Skeet Latch, MD;  Location: [facility] ENDOSCOPY;  Service: Cardiovascular;  Laterality: N/A;   CERVICAL DISC SURGERY     COLONOSCOPY  09/09/2018   ICD IMPLANT N/A 08/05/2021   Procedure: ICD IMPLANT;  Surgeon: Vickie Epley, MD;  Location: [place] CV LAB;  Service: Cardiovascular;  Laterality: N/A;   TONSILLECTOMY      Current Medications: No outpatient medications have been marked as taking for the 03/16/22 encounter (Appointment) with Vickie Epley, MD.      Allergies:   Patient has no known allergies.   Social History   Socioeconomic History   Marital status: Married    Spouse name: Not on file   Number of children: 1   Years of education: Not on file   Highest education level: Not on file  Occupational History   Not on file  Tobacco Use   Smoking status: Never   Smokeless tobacco: Never   Tobacco comments:    Never smoke (12/24/2020)  Vaping Use   Vaping Use: Never used  Substance and Sexual Activity   Alcohol use: Yes    Alcohol/week: 2.0 standard drinks of alcohol    Types: 2 Cans of beer per week    Comment: 2 beers once a week 04/24/2021   Drug use: Never   Sexual activity: Yes  Other Topics Concern   Not on file  Social History Narrative   Full time.    Social Determinants of Health   Financial Resource Strain: Not on file  Food Insecurity: Not on file  Transportation Needs: Not on file  Physical Activity: Not on file  Stress: Not on file  Social Connections: Not on file     Family History: The patient's family history includes Coronary artery disease in an other family member; Diabetes in an other family member; Heart attack in his father; Heart failure in his mother; Hypertension in his mother and sister. There is no history of Colon cancer,  Rectal cancer, Stomach cancer, Esophageal cancer, Pancreatic cancer, or Liver cancer.  ROS:   Please see the history of present illness.    All other systems reviewed and are negative.  EKGs/Labs/Other Studies Reviewed:    The following studies were reviewed today:  03/16/2022 in clinic device interrogation personally reviewed ***  EKG:  The ekg ordered today demonstrates ***  Recent Labs: 09/10/2021: ALT 33 11/04/2021: BUN 21; Creatinine, Ser 1.09; Hemoglobin 15.7; Platelets 280; Potassium 4.4; Sodium 139  Recent Lipid Panel    Component Value Date/Time   CHOL 117 09/10/2021 0819   TRIG 69 09/10/2021 0819   HDL 47 09/10/2021 0819   CHOLHDL 2.5 09/10/2021  0819   CHOLHDL 2.7 04/24/2021 0854   VLDL 11 04/24/2021 0854   LDLCALC 56 09/10/2021 0819   LDLCALC 89 09/26/2020 0000    Physical Exam:    VS:  There were no vitals taken for this visit.    Wt Readings from Last 3 Encounters:  12/17/21 222 lb 12.8 oz (101.1 kg)  12/09/21 223 lb (101.2 kg)  11/11/21 225 lb (102.1 kg)     GEN: *** Well nourished, well developed in no acute distress HEENT: Normal NECK: No JVD; No carotid bruits LYMPHATICS: No lymphadenopathy CARDIAC: ***RRR, no murmurs, rubs, gallops. ICD pocket well healed. RESPIRATORY:  Clear to auscultation without rales, wheezing or rhonchi  ABDOMEN: Soft, non-tender, non-distended MUSCULOSKELETAL:  No edema; No deformity  SKIN: Warm and dry NEUROLOGIC:  Alert and oriented x 3 PSYCHIATRIC:  Normal affect        ASSESSMENT:    1. Persistent atrial fibrillation (HCC)   2. HOCM (hypertrophic obstructive cardiomyopathy) (HCC)   3. Implantable cardioverter-defibrillator (ICD) in situ    PLAN:    In order of problems listed above:  #Persistent AF On OAC for stroke ppx Maintaining sinus rhythm since ablation  #HOCM #ICD in situ Device functioning appropriately. Continue remote monitoring.  Follow up in 1 year with APP.    Medication Adjustments/Labs and Tests Ordered: Current medicines are reviewed at length with the patient today.  Concerns regarding medicines are outlined above.  No orders of the defined types were placed in this encounter.  No orders of the defined types were placed in this encounter.    Signed, Steffanie Dunn, MD, [facility], [facility] 03/15/2022 5:18 PM    Electrophysiology  [facility]

## 2022-03-16 ENCOUNTER — Encounter: Payer: Self-pay | Admitting: Cardiology

## 2022-03-16 ENCOUNTER — Ambulatory Visit: Payer: PRIVATE HEALTH INSURANCE | Attending: Cardiology | Admitting: Cardiology

## 2022-03-16 VITALS — BP 126/78 | HR 63 | Ht 70.0 in | Wt 224.0 lb

## 2022-03-16 DIAGNOSIS — Z95810 Presence of automatic (implantable) cardiac defibrillator: Secondary | ICD-10-CM

## 2022-03-16 DIAGNOSIS — I421 Obstructive hypertrophic cardiomyopathy: Secondary | ICD-10-CM

## 2022-03-16 DIAGNOSIS — I4819 Other persistent atrial fibrillation: Secondary | ICD-10-CM

## 2022-03-16 NOTE — Patient Instructions (Signed)
Medication Instructions:  Your physician recommends that you continue on your current medications as directed. Please refer to the Current Medication list given to you today.  *If you need a refill on your cardiac medications before your next appointment, please call your pharmacy*  Follow-Up: At [facility], you and your health needs are our priority.  As part of our continuing mission to provide you with exceptional heart care, we have created designated Provider Care Teams.  These Care Teams include your primary Cardiologist (physician) and Advanced Practice Providers (APPs -  Physician Assistants and Nurse Practitioners) who all work together to provide you with the care you need, when you need it.  Your next appointment:   1 year(s)  The format for your next appointment:   In Person  Provider:   You will see one of the following Advanced Practice Providers on your designated Care Team:   Renee Ursuy, PA-C Michael "Andy" Tillery, PA-C   Important Information About Sugar       

## 2022-03-16 NOTE — Progress Notes (Signed)
Electrophysiology Office Follow up Visit Note:    Date:  03/16/2022   ID:  Dale Rivera, DOB 1960/05/27, MRN [id number]  PCP:  Christen Butter, NP  [facility] Cardiologist:  Olga Millers, MD  [facility] Electrophysiologist:  Lanier Prude, MD    Interval History:    Dale Rivera is a 61 y.o. male who presents for a follow up visit. He had an AF ablation 11/11/2021. During the ablation, the veins and CTI were ablated. He saw Clide Cliff 12/09/2021 and was doing well maintaining sinus rhythm. He is on eliquis for stroke ppx.  Today, he is feeling great. He denies being aware of any arrhythmic issues. He remains compliant with Eliquis; no bleeding issues.  Two months ago he retired. He is staying active with playing golf and walking. He plans to begin a weight-lifting routine as well.  He denies any palpitations, chest pain, shortness of breath, or peripheral edema. No lightheadedness, headaches, syncope, orthopnea, or PND.       Past Medical History:  Diagnosis Date   Atrial fibrillation (HCC)    Cardiomyopathy    hypertrophic   Gout    HYPERLIPIDEMIA 07/02/2009   Qualifier: Diagnosis of  By: Jens Som, MD, Lyn Hollingshead    Hypertension    Hypertrophic obstructive cardiomyopathy (HCC) 07/03/2009   Qualifier: Diagnosis of  By: Jens Som, MD, Lyn Hollingshead    Lower extremity edema 11/15/2013   Palpitations 09/18/2009   Qualifier: Diagnosis of  By: Jens Som, MD, Lyn Hollingshead    SYNCOPE 07/02/2009   Qualifier: Diagnosis of  By: Jens Som, MD, Lyn Hollingshead     Past Surgical History:  Procedure Laterality Date   ATRIAL FIBRILLATION ABLATION N/A 11/11/2021   Procedure: ATRIAL FIBRILLATION ABLATION;  Surgeon: Lanier Prude, MD;  [facility] INV[facility]IVE CV LAB;  Service: Cardiovascular;  Laterality: N/A;   CARDIOVERSION N/A 05/08/2021   Procedure: CARDIOVERSION;  Surgeon: Chilton Si, MD;  Location: [facility] ENDOSCOPY;  Service: Cardiovascular;   Laterality: N/A;   CERVICAL DISC SURGERY     COLONOSCOPY  09/09/2018   ICD IMPLANT N/A 08/05/2021   Procedure: ICD IMPLANT;  Surgeon: Lanier Prude, MD;  Location: [facility] INVASIVE CV LAB;  Service: Cardiovascular;  Laterality: N/A;   TONSILLECTOMY      Current Medications: Current Meds  Medication Sig   acetaminophen (TYLENOL) 650 MG CR tablet Take 650 mg by mouth every 8 (eight) hours as needed for pain.   allopurinol (ZYLOPRIM) 100 MG tablet TAKE 1 TABLET BY MOUTH  TWICE DAILY   AMBULATORY NON FORMULARY MEDICATION Continuous positive airway pressure (CPAP) machine set on AutoPAP (4-20 cmH2O), with all supplemental supplies as needed.   amLODipine (NORVASC) 5 MG tablet Take 1 tablet (5 mg total) by mouth daily.   apixaban (ELIQUIS) 5 MG TABS tablet Take 1 tablet (5 mg total) by mouth 2 (two) times daily.   atorvastatin (LIPITOR) 80 MG tablet Take 1 tablet (80 mg total) by mouth daily.   busPIRone (BUSPAR) 5 MG tablet Take 1-3 tablets (5-15 mg total) by mouth 2 (two) times daily as needed.   metoprolol succinate (TOPROL-XL) 25 MG 24 hr tablet TAKE 3 TABLETS BY MOUTH DAILY  WITH OR IMMEDIATELY FOLLOWING A  MEAL   sildenafil (REVATIO) 20 MG tablet Take 1-5 tablets (20-100 mg total) by mouth as needed.     Allergies:   Patient has no known allergies.   Social History   Socioeconomic History   Marital status: Married    Spouse  name: Not on file   Number of children: 1   Years of education: Not on file   Highest education level: Not on file  Occupational History   Not on file  Tobacco Use   Smoking status: Never   Smokeless tobacco: Never   Tobacco comments:    Never smoke (12/24/2020)  Vaping Use   Vaping Use: Never used  Substance and Sexual Activity   Alcohol use: Yes    Alcohol/week: 2.0 standard drinks of alcohol    Types: 2 Cans of beer per week    Comment: 2 beers once a week 04/24/2021   Drug use: Never   Sexual activity: Yes  Other Topics Concern   Not on file   Social History Narrative   Full time.    Social Determinants of Health   Financial Resource Strain: Not on file  Food Insecurity: Not on file  Transportation Needs: Not on file  Physical Activity: Not on file  Stress: Not on file  Social Connections: Not on file     Family History: The patient's family history includes Coronary artery disease in an other family member; Diabetes in an other family member; Heart attack in his father; Heart failure in his mother; Hypertension in his mother and sister. There is no history of Colon cancer, Rectal cancer, Stomach cancer, Esophageal cancer, Pancreatic cancer, or Liver cancer.  ROS:   Please see the history of present illness.    All other systems reviewed and are negative.  EKGs/Labs/Other Studies Reviewed:    The following studies were reviewed today:  03/16/2022 in clinic device interrogation personally reviewed Battery longevity okay.  Lead parameters stable.  No atrial fibrillation.  1 nonsustained ventricular tachycardia episode lasting 9 beats.  EKG:  The ekg ordered today demonstrates sinus rhythm.  LVH.  Repolarization abnormality..  Recent Labs: 09/10/2021: ALT 33 11/04/2021: BUN 21; Creatinine, Ser 1.09; Hemoglobin 15.7; Platelets 280; Potassium 4.4; Sodium 139   Recent Lipid Panel    Component Value Date/Time   CHOL 117 09/10/2021 0819   TRIG 69 09/10/2021 0819   HDL 47 09/10/2021 0819   CHOLHDL 2.5 09/10/2021 0819   CHOLHDL 2.7 04/24/2021 0854   VLDL 11 04/24/2021 0854   LDLCALC 56 09/10/2021 0819   LDLCALC 89 09/26/2020 0000    Physical Exam:    VS:  BP 126/78   Pulse 63   Ht 5\' 10"  (1.778 m)   Wt 224 lb (101.6 kg)   SpO2 96%   BMI 32.14 kg/m     Wt Readings from Last 3 Encounters:  03/16/22 224 lb (101.6 kg)  12/17/21 222 lb 12.8 oz (101.1 kg)  12/09/21 223 lb (101.2 kg)     GEN: Well nourished, well developed in no acute distress HEENT: Normal NECK: No JVD; No carotid bruits LYMPHATICS: No  lymphadenopathy CARDIAC: RRR, no murmurs, rubs, gallops. ICD pocket well healed. RESPIRATORY:  Clear to auscultation without rales, wheezing or rhonchi  ABDOMEN: Soft, non-tender, non-distended MUSCULOSKELETAL:  No edema; No deformity  SKIN: Warm and dry NEUROLOGIC:  Alert and oriented x 3 PSYCHIATRIC:  Normal affect        ASSESSMENT:    1. Persistent atrial fibrillation (HCC)   2. HOCM (hypertrophic obstructive cardiomyopathy) (HCC)   3. Implantable cardioverter-defibrillator (ICD) in situ    PLAN:    In order of problems listed above:  #Persistent AF On OAC for stroke ppx Maintaining sinus rhythm since ablation  #HOCM #ICD in situ Device functioning appropriately. Continue  remote monitoring.  Follow up in 1 year with APP.   Medication Adjustments/Labs and Tests Ordered: Current medicines are reviewed at length with the patient today.  Concerns regarding medicines are outlined above.   No orders of the defined types were placed in this encounter.  No orders of the defined types were placed in this encounter.  I,Mathew Stumpf,acting as a Neurosurgeon for Lanier Prude, MD.,have documented all relevant documentation on the behalf of Lanier Prude, MD,as directed by  Lanier Prude, MD while in the presence of Lanier Prude, MD.  I, Lanier Prude, MD, have reviewed all documentation for this visit. The documentation on 03/16/22 for the exam, diagnosis, procedures, and orders are all accurate and complete.   Signed, Steffanie Dunn, MD, [facility], [facility] 03/16/2022 11:53 AM    Electrophysiology [facility]

## 2022-03-20 ENCOUNTER — Encounter: Payer: Self-pay | Admitting: Cardiology

## 2022-05-07 ENCOUNTER — Ambulatory Visit: Payer: PRIVATE HEALTH INSURANCE

## 2022-05-07 DIAGNOSIS — I421 Obstructive hypertrophic cardiomyopathy: Secondary | ICD-10-CM

## 2022-05-07 LAB — CUP PACEART REMOTE DEVICE CHECK
Battery Voltage: 3.11 V
Brady Statistic RV Percent Paced: 0 %
Date Time Interrogation Session: 20240201082606
HighPow Impedance: 78 Ohm
Implantable Lead Connection Status: 753985
Implantable Lead Implant Date: 20230502
Implantable Lead Location: 753860
Implantable Lead Model: 436909
Implantable Lead Serial Number: 81487767
Implantable Pulse Generator Implant Date: 20230502
Lead Channel Impedance Value: 442 Ohm
Lead Channel Pacing Threshold Amplitude: 0.5 V
Lead Channel Pacing Threshold Pulse Width: 0.4 ms
Lead Channel Sensing Intrinsic Amplitude: 20 mV
Lead Channel Sensing Intrinsic Amplitude: 8 mV
Lead Channel Setting Pacing Amplitude: 3 V
Lead Channel Setting Pacing Pulse Width: 0.4 ms
Pulse Gen Model: 429525
Pulse Gen Serial Number: 84895165

## 2022-05-25 ENCOUNTER — Telehealth: Payer: Self-pay

## 2022-05-25 NOTE — Telephone Encounter (Signed)
Received alert from [employer] for ongoing/long atrial event.  Appears patient had 13 hours of AFIB from 2/17-2/18.  Has hx of persistent AF; however, he had been maintaining SR since ablation on 11/11/21.  He follows R. Fenton, PA in AFIB clinic. Will forward to them for follow up.   Spoke with patient.  He states that he has felt fine, maybe some occasional dizziness this weekend but otherwise no symptoms. Only medication change is that he is on an RX lip ointment :  Nystatin. Otherwise no changes.   Patient will wait further instruction if needed for AFIB clinic.

## 2022-05-25 NOTE — Telephone Encounter (Signed)
Pt will call if increase in afib burden otherwise will follow up with lambert as scheduled.

## 2022-05-27 NOTE — Progress Notes (Signed)
HPI: FU hypertrophic cardiomyopathy and atrial fibrillation. Echo repeated 2/22 showed normal LV function, severe LVH, LVOT gradient of 32 mmHg with valsalva, mild LAE. Also with atrial fibrillation. Nuclear study December 2022 showed normal perfusion and ejection fraction 33% but visually was normal. Cardiac MRI April 2023 showed severe asymmetric left ventricular hypertrophy with septum measuring 23 mm.  Cardiac CTA August 2023 showed calcium score 664 which was 90th percentile.  Patient had ICD implant May 2023.  He had atrial fibrillation ablation August 2023.  Since I last saw him, there is no dyspnea, chest pain, palpitations or syncope.  He had 1 brief episode of atrial fibrillation.  Current Outpatient Medications  Medication Sig Dispense Refill   acetaminophen (TYLENOL) 650 MG CR tablet Take 650 mg by mouth every 8 (eight) hours as needed for pain.     allopurinol (ZYLOPRIM) 100 MG tablet TAKE 1 TABLET BY MOUTH  TWICE DAILY 180 tablet 3   AMBULATORY NON FORMULARY MEDICATION Continuous positive airway pressure (CPAP) machine set on AutoPAP (4-20 cmH2O), with all supplemental supplies as needed. 1 each 0   amLODipine (NORVASC) 5 MG tablet Take 1 tablet (5 mg total) by mouth daily. 90 tablet 3   apixaban (ELIQUIS) 5 MG TABS tablet Take 1 tablet (5 mg total) by mouth 2 (two) times daily. 180 tablet 3   atorvastatin (LIPITOR) 80 MG tablet Take 1 tablet (80 mg total) by mouth daily. 90 tablet 3   busPIRone (BUSPAR) 5 MG tablet Take 1-3 tablets (5-15 mg total) by mouth 2 (two) times daily as needed. 60 tablet 0   metoprolol succinate (TOPROL-XL) 25 MG 24 hr tablet TAKE 3 TABLETS BY MOUTH DAILY  WITH OR IMMEDIATELY FOLLOWING A  MEAL 270 tablet 3   sildenafil (REVATIO) 20 MG tablet Take 1-5 tablets (20-100 mg total) by mouth as needed. 50 tablet 11   No current facility-administered medications for this visit.     Past Medical History:  Diagnosis Date   Atrial fibrillation ([place])     Cardiomyopathy    hypertrophic   Gout    HYPERLIPIDEMIA 07/02/2009   Qualifier: Diagnosis of  By: Stanford Breed, MD, Kandyce Rud    Hypertension    Hypertrophic obstructive cardiomyopathy ([place]) 07/03/2009   Qualifier: Diagnosis of  By: Stanford Breed, MD, Kandyce Rud    Lower extremity edema 11/15/2013   Palpitations 09/18/2009   Qualifier: Diagnosis of  By: Stanford Breed, MD, FACC, [place] 07/02/2009   Qualifier: Diagnosis of  By: Stanford Breed, MD, Kandyce Rud     Past Surgical History:  Procedure Laterality Date   ATRIAL FIBRILLATION ABLATION N/A 11/11/2021   Procedure: ATRIAL FIBRILLATION ABLATION;  Surgeon: Vickie Epley, MD;  Location: [place] CV LAB;  Service: Cardiovascular;  Laterality: N/A;   CARDIOVERSION N/A 05/08/2021   Procedure: CARDIOVERSION;  Surgeon: Skeet Latch, MD;  Location: [facility] ENDOSCOPY;  Service: Cardiovascular;  Laterality: N/A;   CERVICAL DISC SURGERY     COLONOSCOPY  09/09/2018   ICD IMPLANT N/A 08/05/2021   Procedure: ICD IMPLANT;  Surgeon: Vickie Epley, MD;  Location: [place] CV LAB;  Service: Cardiovascular;  Laterality: N/A;   TONSILLECTOMY      Social History   Socioeconomic History   Marital status: Married    Spouse name: Not on file   Number of children: 1   Years of education: Not on file   Highest education level: Not on file  Occupational History  Not on file  Tobacco Use   Smoking status: Never   Smokeless tobacco: Never   Tobacco comments:    Never smoke (12/24/2020)  Vaping Use   Vaping Use: Never used  Substance and Sexual Activity   Alcohol use: Yes    Alcohol/week: 2.0 standard drinks of alcohol    Types: 2 Cans of beer per week    Comment: 2 beers once a week 04/24/2021   Drug use: Never   Sexual activity: Yes  Other Topics Concern   Not on file  Social History Narrative   Full time.    Social Determinants of Health   Financial Resource Strain: Not on file  Food  Insecurity: Not on file  Transportation Needs: Not on file  Physical Activity: Not on file  Stress: Not on file  Social Connections: Not on file  Intimate Partner Violence: Not on file    Family History  Problem Relation Age of Onset   Hypertension Mother    Heart failure Mother    Heart attack Father    Hypertension Sister    Coronary artery disease Other    Diabetes Other        siblings   Colon cancer Neg Hx    Rectal cancer Neg Hx    Stomach cancer Neg Hx    Esophageal cancer Neg Hx    Pancreatic cancer Neg Hx    Liver cancer Neg Hx     ROS: no fevers or chills, productive cough, hemoptysis, dysphasia, odynophagia, melena, hematochezia, dysuria, hematuria, rash, seizure activity, orthopnea, PND, pedal edema, claudication. Remaining systems are negative.  Physical Exam: Well-developed well-nourished in no acute distress.  Skin is warm and dry.  HEENT is normal.  Neck is supple.  Chest is clear to auscultation with normal expansion.  Cardiovascular exam is regular rate and rhythm.  Abdominal exam nontender or distended. No masses palpated. Extremities show no edema. neuro grossly intact  ECG- personally reviewed  A/P  1 paroxysmal atrial fibrillation-he remains in sinus rhythm status post ablation.  Continue beta-blocker and apixaban at present dose.  Check hemoglobin and renal function.  2 hypertrophic cardiomyopathy-continue beta-blocker.  He is scheduled for genetic testing with his daughter in May.  Repeat echocardiogram.  3 history of ICD-Per EP.  4 hypertension-blood pressure controlled.  Continue present medical regimen.  5 hyperlipidemia-continue statin.  Check lipids and liver.  6 history of elevated calcium score-follow-up nuclear study showed no ischemia and he is not having chest pain.  Continue medical therapy.  Will continue statin.  No aspirin given need for apixaban.  7 palpitations-continue beta-blocker.  Kirk Ruths, MD

## 2022-05-27 NOTE — Progress Notes (Signed)
Remote ICD transmission.   

## 2022-06-10 ENCOUNTER — Ambulatory Visit: Payer: PRIVATE HEALTH INSURANCE | Attending: Cardiology | Admitting: Cardiology

## 2022-06-10 ENCOUNTER — Encounter: Payer: Self-pay | Admitting: Cardiology

## 2022-06-10 VITALS — BP 138/79 | HR 75 | Ht 70.0 in | Wt 224.0 lb

## 2022-06-10 DIAGNOSIS — I421 Obstructive hypertrophic cardiomyopathy: Secondary | ICD-10-CM

## 2022-06-10 DIAGNOSIS — E7800 Pure hypercholesterolemia, unspecified: Secondary | ICD-10-CM | POA: Diagnosis not present

## 2022-06-10 DIAGNOSIS — I4819 Other persistent atrial fibrillation: Secondary | ICD-10-CM

## 2022-06-10 NOTE — Patient Instructions (Signed)
  Testing/Procedures:  Your physician has requested that you have an echocardiogram. Echocardiography is a painless test that uses sound waves to create images of your heart. It provides your doctor with information about the size and shape of your heart and how well your heart's chambers and valves are working. This procedure takes approximately one hour. There are no restrictions for this procedure. Please do NOT wear cologne, perfume, aftershave, or lotions (deodorant is allowed). Please arrive 15 minutes prior to your appointment time. [facility]-IMAGING DEPARTMENT ON THE 1 ST FLOOR   Follow-Up: At [facility], you and your health needs are our priority.  As part of our continuing mission to provide you with exceptional heart care, we have created designated Provider Care Teams.  These Care Teams include your primary Cardiologist (physician) and Advanced Practice Providers (APPs -  Physician Assistants and Nurse Practitioners) who all work together to provide you with the care you need, when you need it.  We recommend signing up for the patient portal called "MyChart".  Sign up information is provided on this After Visit Summary.  MyChart is used to connect with patients for Virtual Visits (Telemedicine).  Patients are able to view lab/test results, encounter notes, upcoming appointments, etc.  Non-urgent messages can be sent to your provider as well.   To learn more about what you can do with MyChart, go to [email or web address].    Your next appointment:   6 month(s)  Provider:   Kirk Ruths, MD

## 2022-06-11 LAB — LIPID PANEL
Chol/HDL Ratio: 2.3 ratio (ref 0.0–5.0)
Cholesterol, Total: 125 mg/dL (ref 100–199)
HDL: 54 mg/dL (ref >39)
LDL Chol Calc (NIH): 54 mg/dL (ref 0–99)
Triglycerides: 87 mg/dL (ref 0–149)
VLDL Cholesterol Cal: 17 mg/dL (ref 5–40)

## 2022-06-11 LAB — COMPREHENSIVE METABOLIC PANEL
ALT: 35 IU/L (ref 0–44)
AST: 29 IU/L (ref 0–40)
Albumin/Globulin Ratio: 2.2 (ref 1.2–2.2)
Albumin: 4.9 g/dL (ref 3.9–4.9)
Alkaline Phosphatase: 91 IU/L (ref 44–121)
BUN/Creatinine Ratio: 18 (ref 10–24)
BUN: 19 mg/dL (ref 8–27)
Bilirubin Total: 1.1 mg/dL (ref 0.0–1.2)
CO2: 21 mmol/L (ref 20–29)
Calcium: 10.3 mg/dL — ABNORMAL HIGH (ref 8.6–10.2)
Chloride: 99 mmol/L (ref 96–106)
Creatinine, Ser: 1.07 mg/dL (ref 0.76–1.27)
Globulin, Total: 2.2 g/dL (ref 1.5–4.5)
Glucose: 122 mg/dL — ABNORMAL HIGH (ref 70–99)
Potassium: 4.8 mmol/L (ref 3.5–5.2)
Sodium: 140 mmol/L (ref 134–144)
Total Protein: 7.1 g/dL (ref 6.0–8.5)
eGFR: 78 mL/min/{1.73_m2} (ref >59)

## 2022-06-15 ENCOUNTER — Telehealth: Payer: Self-pay

## 2022-06-15 NOTE — Telephone Encounter (Signed)
Alert received from [employer]:  Details for arrhythmia episode received (all types) Episode details were received for a spontaneous Atr. monitoring episode, which was detected on Jun 10, 2022, 11:25:00 PM

## 2022-06-15 NOTE — Telephone Encounter (Signed)
Episode terminated.  Will continue to monitor for persistent afib.

## 2022-06-16 ENCOUNTER — Encounter: Payer: Self-pay | Admitting: Cardiology

## 2022-06-16 DIAGNOSIS — I2510 Atherosclerotic heart disease of native coronary artery without angina pectoris: Secondary | ICD-10-CM

## 2022-06-16 DIAGNOSIS — E7800 Pure hypercholesterolemia, unspecified: Secondary | ICD-10-CM

## 2022-06-23 ENCOUNTER — Ambulatory Visit (INDEPENDENT_AMBULATORY_CARE_PROVIDER_SITE_OTHER): Payer: PRIVATE HEALTH INSURANCE | Admitting: Medical-Surgical

## 2022-06-23 ENCOUNTER — Encounter: Payer: Self-pay | Admitting: Medical-Surgical

## 2022-06-23 ENCOUNTER — Ambulatory Visit: Payer: PRIVATE HEALTH INSURANCE

## 2022-06-23 VITALS — BP 115/78 | HR 68 | Resp 20 | Ht 70.0 in | Wt 226.2 lb

## 2022-06-23 DIAGNOSIS — Z23 Encounter for immunization: Secondary | ICD-10-CM

## 2022-06-23 DIAGNOSIS — B351 Tinea unguium: Secondary | ICD-10-CM

## 2022-06-23 DIAGNOSIS — R438 Other disturbances of smell and taste: Secondary | ICD-10-CM

## 2022-06-23 DIAGNOSIS — M5442 Lumbago with sciatica, left side: Secondary | ICD-10-CM

## 2022-06-23 DIAGNOSIS — L989 Disorder of the skin and subcutaneous tissue, unspecified: Secondary | ICD-10-CM

## 2022-06-23 DIAGNOSIS — N529 Male erectile dysfunction, unspecified: Secondary | ICD-10-CM | POA: Diagnosis not present

## 2022-06-23 MED ORDER — TADALAFIL 5 MG PO TABS: 5.0000 mg | ORAL_TABLET | Freq: Every day | ORAL | 11 refills | Status: DC | PRN

## 2022-06-23 MED ORDER — TERBINAFINE HCL 250 MG PO TABS: 250.0000 mg | ORAL_TABLET | Freq: Every day | ORAL | 2 refills | Status: DC

## 2022-06-23 NOTE — Progress Notes (Signed)
Established Patient Office Visit  Subjective   Patient ID: Dale Rivera, male   DOB: [birth year] Age: 61 y.o. MRN: [id number]   Chief Complaint  Patient presents with   Back Pain   Nail Problem   Mouth Lesions   HPI Pleasant 62 year old male presenting today for the following:  Has had a toenail issue on the left foot great toe for couple of years.  Has been working to manage this at home however it is progressed until most of the nail is covered.  Feels that it is a type of fungus but has not been able to clear this up at home.  Has noted a metallic taste in his mouth over the past weeks to months.  Also noted that his lips had gotten very chapped and irritated and he is also had a couple of cold sores.  He is concerned because he recently elevated blood glucose and he read that diabetes/prediabetes can contribute to metallic taste.  Has a history of erectile dysfunction.  Has been treating it with sildenafil 40-60 mg daily as needed.  This seems to work well for him but he is interested in possibly switching to generic Cialis since he took this in the past and it worked a little better for him.  Has a history of low back pain, specifically affecting the left side at the SI joint.  Unable to take NSAIDs.  Has been using IcyHot topically.  Has tried lidocaine patches, Voltaren gel, and other over-the-counter analgesics without much relief.  No lower extremity weakness/numbness/tingling, incontinence, or saddle paresthesias.   Objective:    Vitals:   06/23/22 0900 06/23/22 0915  BP: (!) 155/82 115/78  Pulse: 73 68  Resp: 20 20  Height: 5\' 10"  (1.778 m)   Weight: 226 lb 3.2 oz (102.6 kg)   SpO2: 98% 97%  BMI (Calculated): 32.46    Physical Exam Vitals reviewed.  Constitutional:      General: He is not in acute distress.    Appearance: Normal appearance. He is obese. He is not ill-appearing.  HENT:     Head: Normocephalic.  Cardiovascular:     Rate and Rhythm: Normal rate.      Pulses: Normal pulses.     Heart sounds: Normal heart sounds. No murmur heard.    No friction rub. No gallop.  Pulmonary:     Effort: Pulmonary effort is normal. No respiratory distress.     Breath sounds: Normal breath sounds.  Feet:     Left foot:     Toenail Condition: Fungal disease present. Skin:    General: Skin is warm and dry.  Neurological:     Mental Status: He is alert and oriented to person, place, and time.  Psychiatric:        Mood and Affect: Mood normal.        Behavior: Behavior normal.        Thought Content: Thought content normal.        Judgment: Judgment normal.   No results found for this or any previous visit (from the past 24 hour(s)).     The ASCVD Risk score (Arnett DK, et al., 2019) failed to calculate for the following reasons:   The valid total cholesterol range is 130 to 320 mg/dL   Assessment & Plan:   1. Onychomycosis A large portion of the left great toenail is affected by onychomycosis.  We are treating with terbinafine 250 mg daily for 4 weeks then recheck liver function.  If all is well, we will continue for a full 12 weeks.  2. Erectile dysfunction, unspecified erectile dysfunction type Switching to tadalafil 5 mg daily as needed.  Advised him to contact his pharmacy to see if this will be more cost effective and if not, please let me know and we can always switch back to sildenafil.  3. Left-sided low back pain with left-sided sciatica, unspecified chronicity Getting lumbar spine x-rays today.  SI joint home exercises provided.  Discussed topical analgesia with Voltaren gel up to 4 times daily.  Discussed Zynex as a possible adjunct to pain management. He will let me know if the other stuff fails.  - DG Lumbar Spine Complete; Future  4. Metallic taste Consider deficiency, thyroid dysfunction, diabetes, or medication side effect.  Checking vitamin B12, zinc, and TSH.  He has lab orders already in place to check his A1c as well as his  calcium.  Has been on allopurinol long-term so less suspicious that this may be causing the metallic taste in his mouth. - Vitamin B12 - Zinc - TSH  5. Skin lesion Referring to dermatology per patient request. - Ambulatory referral to Dermatology  6. Need for shingles vaccine Shingrix given in office today. - Zoster Recombinant (Shingrix )  Return for recheck of CMP in 4 weeks; chronic disease follow up in 6 months.  ___________________________________________ Clearnce Sorrel, DNP, APRN, FNP-BC Primary [facility] and [place]

## 2022-06-24 ENCOUNTER — Ambulatory Visit (HOSPITAL_BASED_OUTPATIENT_CLINIC_OR_DEPARTMENT_OTHER)
Admission: RE | Admit: 2022-06-24 | Discharge: 2022-06-24 | Disposition: A | Discharge status: Home / Self Care | Payer: PRIVATE HEALTH INSURANCE | Source: Ambulatory Visit | Attending: Cardiology | Admitting: Cardiology

## 2022-06-24 DIAGNOSIS — I421 Obstructive hypertrophic cardiomyopathy: Secondary | ICD-10-CM | POA: Insufficient documentation

## 2022-06-24 LAB — ECHOCARDIOGRAM COMPLETE
Area-P 1/2: 4.8 cm2
S' Lateral: 2.6 cm

## 2022-06-26 ENCOUNTER — Encounter: Payer: Self-pay | Admitting: Medical-Surgical

## 2022-06-27 LAB — VITAMIN B12: Vitamin B-12: 249 pg/mL (ref 232–1245)

## 2022-06-27 LAB — ZINC: Zinc: 71 ug/dL (ref 44–115)

## 2022-06-27 LAB — TSH: TSH: 4.78 u[IU]/mL — ABNORMAL HIGH (ref 0.450–4.500)

## 2022-06-29 LAB — T4, FREE: Free T4: 1.15 ng/dL (ref 0.82–1.77)

## 2022-06-29 LAB — SPECIMEN STATUS REPORT

## 2022-06-30 ENCOUNTER — Other Ambulatory Visit: Payer: Self-pay | Admitting: Physician Assistant

## 2022-07-01 LAB — HEMOGLOBIN A1C
Est. average glucose Bld gHb Est-mCnc: 146 mg/dL
Hgb A1c MFr Bld: 6.7 % — ABNORMAL HIGH (ref 4.8–5.6)

## 2022-07-01 LAB — CALCIUM: Calcium: 9.8 mg/dL (ref 8.6–10.2)

## 2022-07-02 IMAGING — DX DG CHEST 2V
2 series · 2 of 2 positions shown · non-contrast
Comparison: Coronary calcium score CT 02/12/2021

CLINICAL DATA: ICD in place.

EXAM:
CHEST - 2 VIEW

[w chest pa]
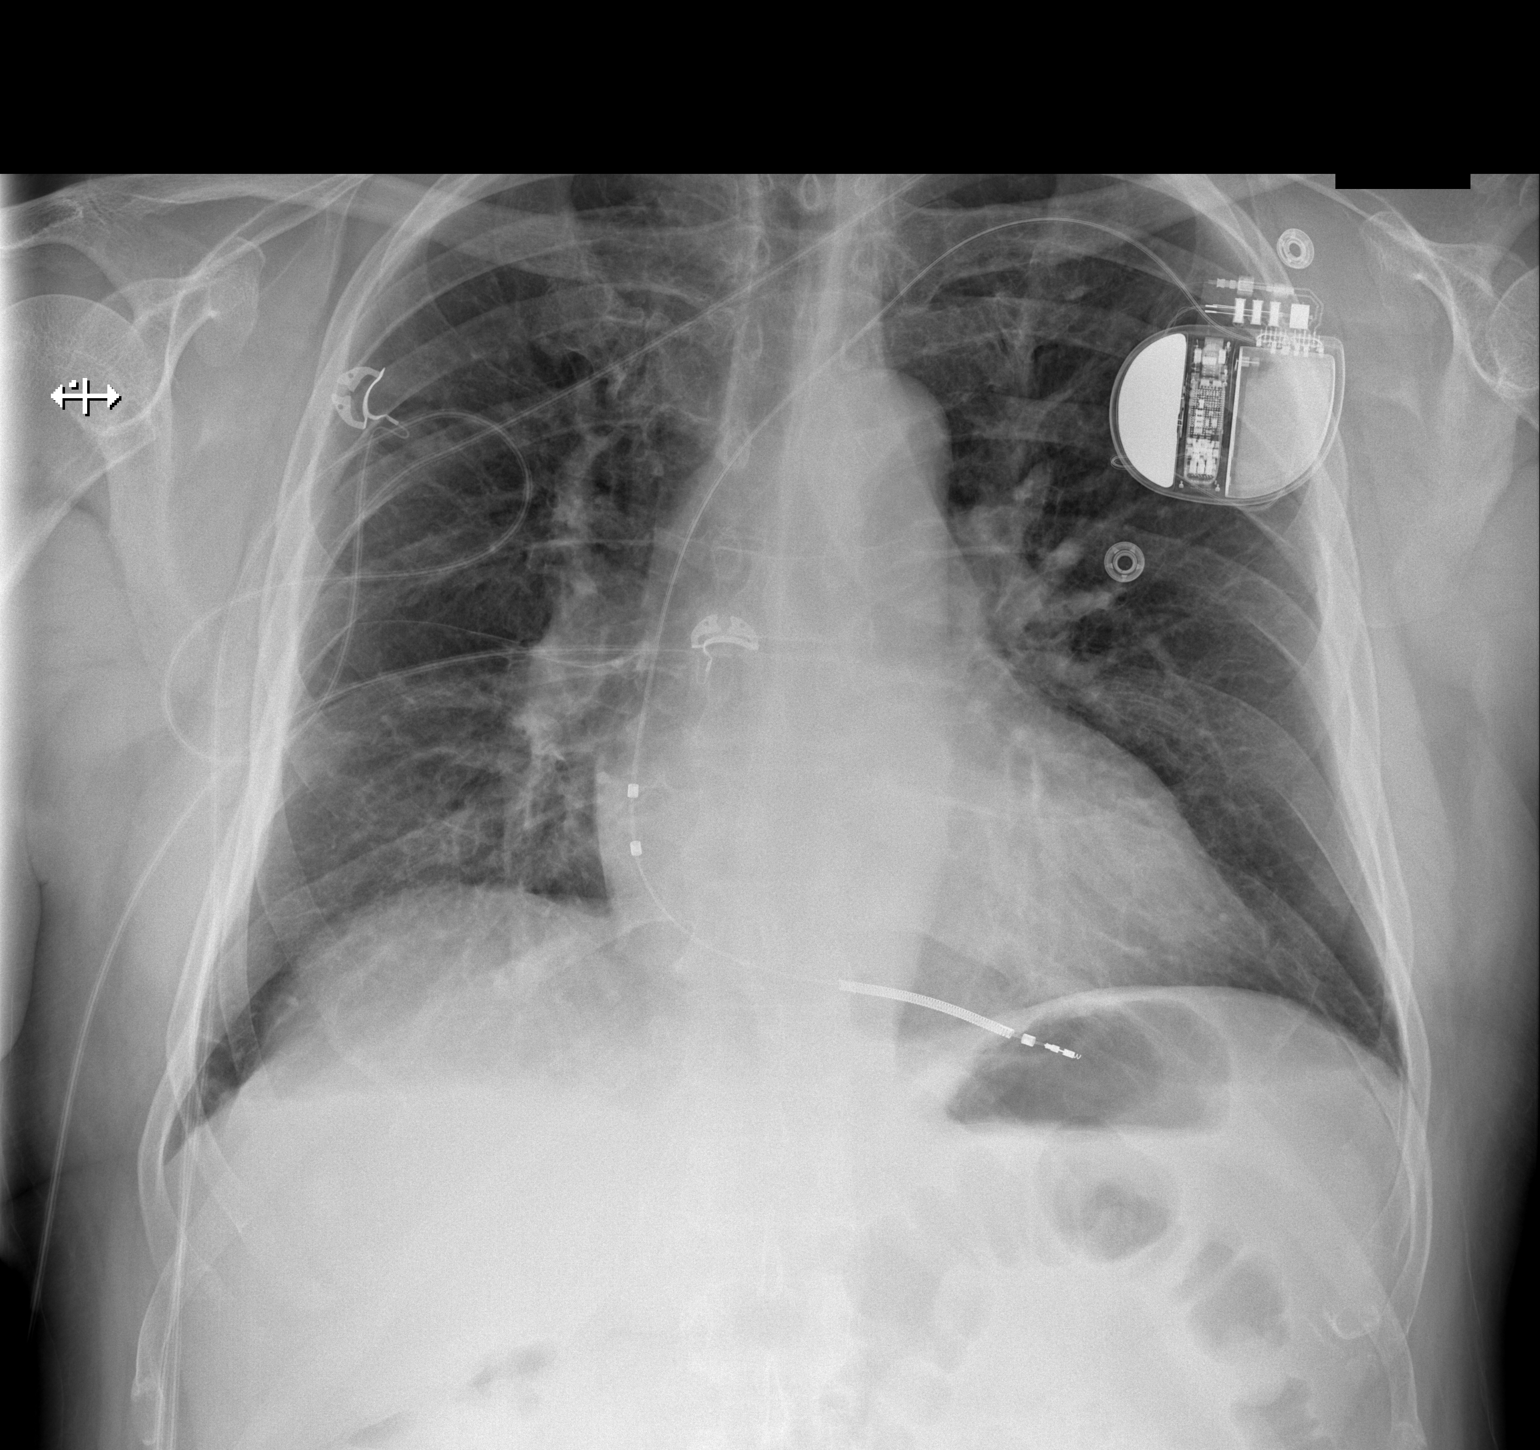

[w chest lat]
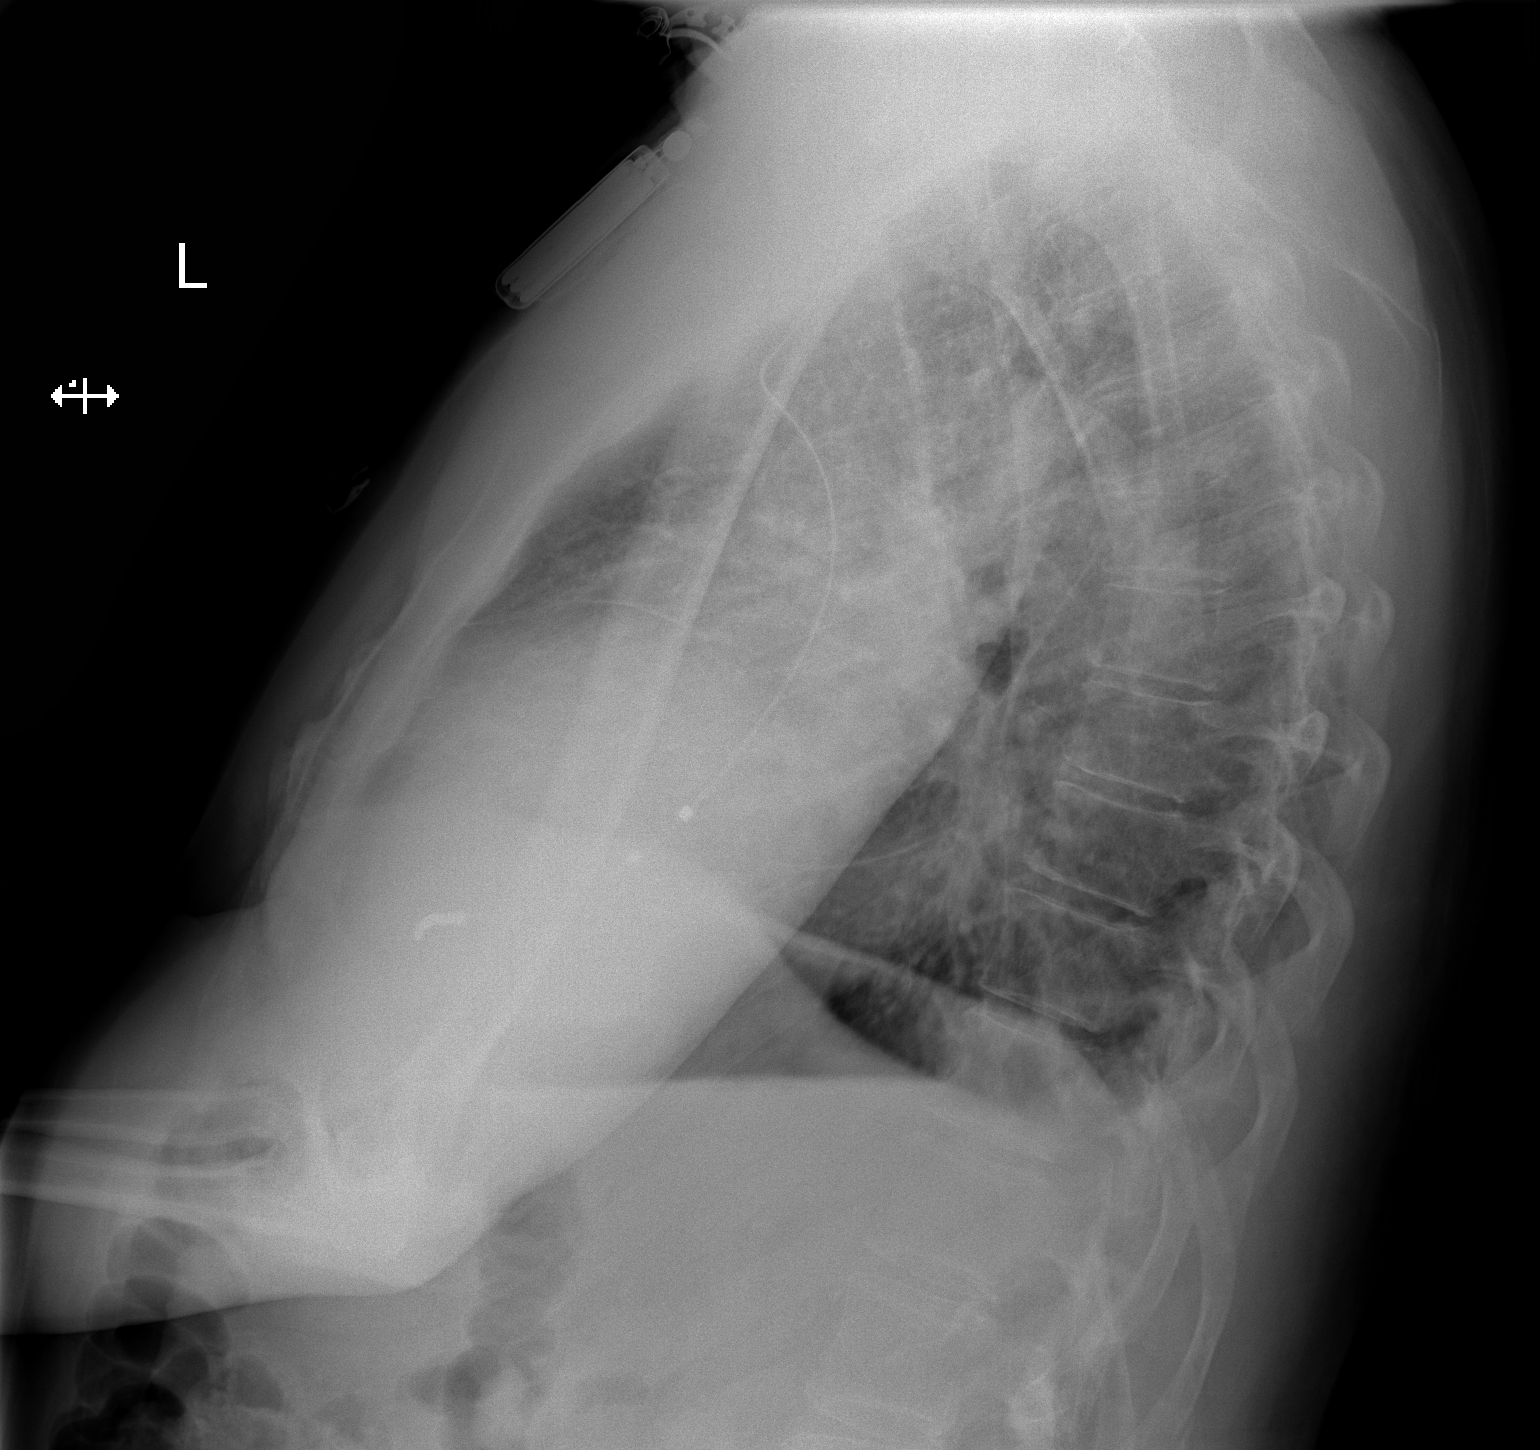

[2 of 2 positions shown; findings below may reference images not displayed]

FINDINGS: A left subclavian approach ICD has been placed with lead terminating
in the right ventricle. The cardiac silhouette is crash that the
cardiomediastinal silhouette is within normal limits. No airspace
consolidation, edema, or pneumothorax is identified. Trace pleural
effusions are not excluded. No acute osseous abnormality is seen.
IMPRESSION: Interval ICD placement. No pneumothorax. Questionable trace pleural
effusions.

## 2022-07-07 ENCOUNTER — Telehealth (INDEPENDENT_AMBULATORY_CARE_PROVIDER_SITE_OTHER): Payer: PRIVATE HEALTH INSURANCE | Admitting: Medical-Surgical

## 2022-07-07 ENCOUNTER — Encounter: Payer: Self-pay | Admitting: Medical-Surgical

## 2022-07-07 DIAGNOSIS — B351 Tinea unguium: Secondary | ICD-10-CM

## 2022-07-07 DIAGNOSIS — E119 Type 2 diabetes mellitus without complications: Secondary | ICD-10-CM | POA: Diagnosis not present

## 2022-07-07 MED ORDER — LANCETS MISC. MISC: 1.0000 | Freq: Two times a day (BID) | 11 refills | Status: AC | PRN

## 2022-07-07 MED ORDER — LANCET DEVICE MISC: 1.0000 | Freq: Two times a day (BID) | 0 refills | Status: AC | PRN

## 2022-07-07 MED ORDER — OZEMPIC (0.25 OR 0.5 MG/DOSE) 2 MG/3ML ~~LOC~~ SOPN: 0.2500 mg | PEN_INJECTOR | SUBCUTANEOUS | 0 refills | Status: DC

## 2022-07-07 MED ORDER — BLOOD GLUCOSE MONITORING SUPPL DEVI: 1.0000 | Freq: Two times a day (BID) | 0 refills | Status: AC | PRN

## 2022-07-07 MED ORDER — BLOOD GLUCOSE TEST VI STRP: 1.0000 | ORAL_STRIP | Freq: Two times a day (BID) | 11 refills | Status: AC | PRN

## 2022-07-07 NOTE — Progress Notes (Signed)
Virtual Visit via Video Note  I connected with Dale Rivera on 07/07/22 at 10:30 AM EDT by a video enabled telemedicine application and verified that I am speaking with the correct person using two identifiers.   I discussed the limitations of evaluation and management by telemedicine and the availability of in person appointments. The patient expressed understanding and agreed to proceed.  Patient location: home Provider locations: office  Subjective:    CC: type 2 diabetes  HPI: Pleasant 62 year old male presenting to discuss recent  lab results and the new diagnosis of diabetes. His A1c was 6.7% on 06/30/22.   Past medical history, Surgical history, Family history not pertinant except as noted below, Social history, Allergies, and medications have been entered into the medical record, reviewed, and corrections made.   Review of Systems: See HPI for pertinent positives and negatives.   Objective:    General: Speaking clearly in complete sentences without any shortness of breath.  Alert and oriented x3.  Normal judgment. No apparent acute distress.  Impression and Recommendations:    1. Onychomycosis On terbinafine for 1-2 weeks. Tolerating well. Needs liver function rechecked after 4 weeks of therapy. Order placed.  - Comprehensive Metabolic Panel (CMET)  2. Type 2 diabetes mellitus without complication, without long-term current use of insulin Discussed current guidelines for management of diabetes. His A1c is in the controlled range at this point. Discussed medication options available. Since we have discussed weight loss recommendations, he would like to try getting started on Ozempic or Mounjaro. Starting Ozempic at 0.25mg  weekly for 4 weeks then increase to 0.5mg  weekly. Referring for diabetic education. Already on a statin. Holding off on adding an ACE/ARB due to heart history and currently well controlled BP. Plan to check microalbumin at next in office visit. Recommend  updating his eye exam since this is overdue but advised to notify his eye doctor of the diabetes diagnosis. Plan to touch base via MyChart in about 3-4 weeks to evaluate tolerance of Ozempic.  - Ambulatory referral to diabetic education  I discussed the assessment and treatment plan with the patient. The patient was provided an opportunity to ask questions and all were answered. The patient agreed with the plan and demonstrated an understanding of the instructions.   The patient was advised to call back or seek an in-person evaluation if the symptoms worsen or if the condition fails to improve as anticipated.  25 minutes of non-face-to-face time was provided during this encounter.  Return in about 6 months (around 01/06/2023) for chronic disease follow up.  Clearnce Sorrel, DNP, APRN, FNP-BC [facility]

## 2022-07-14 ENCOUNTER — Ambulatory Visit: Payer: PRIVATE HEALTH INSURANCE | Admitting: Genetic Counselor

## 2022-07-16 ENCOUNTER — Telehealth: Payer: Self-pay

## 2022-07-16 NOTE — Telephone Encounter (Addendum)
Initiated Prior authorization [id number] (0.25 or 0.5 MG/DOSE) /3ML pen-injectors Via: Covermymeds Case/Key:[id number] Status: approved as of 07/16/23 Reason:approved through 07/16/2023 Notified Pt via: Mychart

## 2022-08-06 ENCOUNTER — Ambulatory Visit: Payer: PRIVATE HEALTH INSURANCE

## 2022-08-06 DIAGNOSIS — I421 Obstructive hypertrophic cardiomyopathy: Secondary | ICD-10-CM

## 2022-08-06 LAB — CUP PACEART REMOTE DEVICE CHECK
Battery Voltage: 3.09 V
Brady Statistic RV Percent Paced: 0 %
Date Time Interrogation Session: 20240502071930
HighPow Impedance: 82 Ohm
Implantable Lead Connection Status: 753985
Implantable Lead Implant Date: 20230502
Implantable Lead Location: 753860
Implantable Lead Model: 436909
Implantable Lead Serial Number: 81487767
Implantable Pulse Generator Implant Date: 20230502
Lead Channel Impedance Value: 442 Ohm
Lead Channel Pacing Threshold Amplitude: 0.6 V
Lead Channel Pacing Threshold Pulse Width: 0.4 ms
Lead Channel Sensing Intrinsic Amplitude: 20 mV
Lead Channel Sensing Intrinsic Amplitude: 7.7 mV
Lead Channel Setting Pacing Amplitude: 3 V
Lead Channel Setting Pacing Pulse Width: 0.4 ms
Pulse Gen Model: 429525
Pulse Gen Serial Number: 84895165

## 2022-08-12 ENCOUNTER — Encounter: Payer: Self-pay | Admitting: Medical-Surgical

## 2022-08-12 DIAGNOSIS — B351 Tinea unguium: Secondary | ICD-10-CM

## 2022-08-13 MED ORDER — OZEMPIC (0.25 OR 0.5 MG/DOSE) 2 MG/3ML ~~LOC~~ SOPN: 0.5000 mg | PEN_INJECTOR | SUBCUTANEOUS | 1 refills | Status: DC

## 2022-08-20 LAB — CMP14+EGFR
ALT: 42 IU/L (ref 0–44)
AST: 29 IU/L (ref 0–40)
Albumin/Globulin Ratio: 2.3 — ABNORMAL HIGH (ref 1.2–2.2)
Albumin: 4.6 g/dL (ref 3.9–4.9)
Alkaline Phosphatase: 101 IU/L (ref 44–121)
BUN/Creatinine Ratio: 16 (ref 10–24)
BUN: 18 mg/dL (ref 8–27)
Bilirubin Total: 0.7 mg/dL (ref 0.0–1.2)
CO2: 22 mmol/L (ref 20–29)
Calcium: 9.7 mg/dL (ref 8.6–10.2)
Chloride: 102 mmol/L (ref 96–106)
Creatinine, Ser: 1.12 mg/dL (ref 0.76–1.27)
Globulin, Total: 2 g/dL (ref 1.5–4.5)
Glucose: 106 mg/dL — ABNORMAL HIGH (ref 70–99)
Potassium: 5.1 mmol/L (ref 3.5–5.2)
Sodium: 139 mmol/L (ref 134–144)
Total Protein: 6.6 g/dL (ref 6.0–8.5)
eGFR: 73 mL/min/{1.73_m2} (ref >59)

## 2022-08-26 NOTE — Progress Notes (Signed)
Remote ICD transmission.   

## 2022-09-07 ENCOUNTER — Ambulatory Visit: Payer: PRIVATE HEALTH INSURANCE | Admitting: Dietician

## 2022-09-23 ENCOUNTER — Other Ambulatory Visit: Payer: Self-pay | Admitting: Medical-Surgical

## 2022-10-26 ENCOUNTER — Encounter: Payer: Self-pay | Admitting: Medical-Surgical

## 2022-10-26 MED ORDER — SEMAGLUTIDE (1 MG/DOSE) 4 MG/3ML ~~LOC~~ SOPN: 1.0000 mg | PEN_INJECTOR | SUBCUTANEOUS | 2 refills | Status: DC

## 2022-11-04 ENCOUNTER — Ambulatory Visit: Payer: PRIVATE HEALTH INSURANCE | Admitting: Registered"

## 2022-11-05 ENCOUNTER — Ambulatory Visit (INDEPENDENT_AMBULATORY_CARE_PROVIDER_SITE_OTHER): Payer: PRIVATE HEALTH INSURANCE

## 2022-11-05 DIAGNOSIS — I421 Obstructive hypertrophic cardiomyopathy: Secondary | ICD-10-CM | POA: Diagnosis not present

## 2022-11-05 LAB — CUP PACEART REMOTE DEVICE CHECK
Date Time Interrogation Session: 20240801074833
Implantable Lead Connection Status: 753985
Implantable Lead Implant Date: 20230502
Implantable Lead Location: 753860
Implantable Lead Model: 436909
Implantable Lead Serial Number: 81487767
Implantable Pulse Generator Implant Date: 20230502
Pulse Gen Model: 429525
Pulse Gen Serial Number: 84895165

## 2022-11-15 ENCOUNTER — Other Ambulatory Visit: Payer: Self-pay | Admitting: Cardiology

## 2022-11-15 ENCOUNTER — Other Ambulatory Visit: Payer: Self-pay | Admitting: Medical-Surgical

## 2022-11-15 DIAGNOSIS — I421 Obstructive hypertrophic cardiomyopathy: Secondary | ICD-10-CM

## 2022-11-15 DIAGNOSIS — M1A9XX0 Chronic gout, unspecified, without tophus (tophi): Secondary | ICD-10-CM

## 2022-11-15 DIAGNOSIS — E7800 Pure hypercholesterolemia, unspecified: Secondary | ICD-10-CM

## 2022-11-17 NOTE — Progress Notes (Signed)
Remote ICD transmission.   

## 2022-11-30 NOTE — Progress Notes (Signed)
HPI: FU hypertrophic cardiomyopathy and atrial fibrillation. Nuclear study December 2022 showed normal perfusion and ejection fraction 33% but visually was normal. Cardiac MRI April 2023 showed severe asymmetric left ventricular hypertrophy with septum measuring 23 mm. Cardiac CTA August 2023 showed calcium score 664 which was 90th percentile.  Patient had ICD implant May 2023.  He had atrial fibrillation ablation August 2023.  Echocardiogram March 2024 showed normal LV function, moderate asymmetric septal hypertrophy, grade 1 diastolic dysfunction, mild left atrial enlargement.  Since I last saw him, he denies dyspnea, chest pain, palpitations or syncope.  Current Outpatient Medications  Medication Sig Dispense Refill   acetaminophen (TYLENOL) 650 MG CR tablet Take 650 mg by mouth every 8 (eight) hours as needed for pain.     allopurinol (ZYLOPRIM) 100 MG tablet TAKE 1 TABLET BY MOUTH TWICE  DAILY 180 tablet 0   AMBULATORY NON FORMULARY MEDICATION Continuous positive airway pressure (CPAP) machine set on AutoPAP (4-20 cmH2O), with all supplemental supplies as needed. 1 each 0   amLODipine (NORVASC) 5 MG tablet TAKE 1 TABLET BY MOUTH DAILY 90 tablet 3   apixaban (ELIQUIS) 5 MG TABS tablet TAKE 1 TABLET BY MOUTH TWICE  DAILY 180 tablet 2   atorvastatin (LIPITOR) 80 MG tablet TAKE 1 TABLET BY MOUTH DAILY 90 tablet 3   Blood Glucose Monitoring Suppl DEVI 1 each by Does not apply route 2 (two) times daily as needed. May substitute to any manufacturer covered by patient's insurance. 1 each 0   busPIRone (BUSPAR) 5 MG tablet Take 1-3 tablets (5-15 mg total) by mouth 2 (two) times daily as needed. 60 tablet 0   Glucose Blood (BLOOD GLUCOSE TEST STRIPS) STRP 1 each by In Vitro route 2 (two) times daily as needed. May substitute to any manufacturer covered by patient's insurance. 100 strip 11   Lancet Device MISC 1 each by Does not apply route 2 (two) times daily as needed. May substitute to any  manufacturer covered by patient's insurance. 1 each 0   Lancets Misc. MISC 1 each by Does not apply route 2 (two) times daily as needed. May substitute to any manufacturer covered by patient's insurance. 100 each 11   metoprolol succinate (TOPROL-XL) 25 MG 24 hr tablet TAKE 3 TABLETS BY MOUTH DAILY  WITH OR IMMEDIATELY FOLLOWING A  MEAL 270 tablet 3   Semaglutide, 1 MG/DOSE, 4 MG/3ML SOPN Inject 1 mg as directed once a week. 3 mL 2   tadalafil (CIALIS) 5 MG tablet Take 1 tablet (5 mg total) by mouth daily as needed for erectile dysfunction. 30 tablet 11   terbinafine (LAMISIL) 250 MG tablet TAKE 1 TABLET(250 MG) BY MOUTH DAILY 30 tablet 2   No current facility-administered medications for this visit.     Past Medical History:  Diagnosis Date   Atrial fibrillation (HCC)    Cardiomyopathy    hypertrophic   Gout    HYPERLIPIDEMIA 07/02/2009   Qualifier: Diagnosis of  By: Jens Som, MD, Lyn Hollingshead    Hypertension    Hypertrophic obstructive cardiomyopathy (HCC) 07/03/2009   Qualifier: Diagnosis of  By: Jens Som, MD, Lyn Hollingshead    Lower extremity edema 11/15/2013   Palpitations 09/18/2009   Qualifier: Diagnosis of  By: Jens Som, MD, Lyn Hollingshead    SYNCOPE 07/02/2009   Qualifier: Diagnosis of  By: Jens Som, MD, Lyn Hollingshead     Past Surgical History:  Procedure Laterality Date   ATRIAL FIBRILLATION ABLATION N/A 11/11/2021  Procedure: ATRIAL FIBRILLATION ABLATION;  Surgeon: Lanier Prude, MD;  Location: [facility] INVASIVE CV LAB;  Service: Cardiovascular;  Laterality: N/A;   CARDIOVERSION N/A 05/08/2021   Procedure: CARDIOVERSION;  Surgeon: Chilton Si, MD;  Location[facility] ENDOSCOPY;  Service: Cardiovascular;  Laterality: N/A;   CERVICAL DISC SURGERY     COLONOSCOPY  09/09/2018   ICD IMPLANT N/A 08/05/2021   Procedure: ICD IMPLANT;  Surgeon: Lanier Prude, MD;  Location: [facility] INVASIVE CV LAB;  Service: Cardiovascular;  Laterality: N/A;    TONSILLECTOMY      Social History   Socioeconomic History   Marital status: Married    Spouse name: Not on file   Number of children: 1   Years of education: Not on file   Highest education level: Not on file  Occupational History   Not on file  Tobacco Use   Smoking status: Never   Smokeless tobacco: Never   Tobacco comments:    Never smoke (12/24/2020)  Vaping Use   Vaping status: Never Used  Substance and Sexual Activity   Alcohol use: Yes    Alcohol/week: 2.0 standard drinks of alcohol    Types: 2 Cans of beer per week    Comment: 2 beers once a week 04/24/2021   Drug use: Never   Sexual activity: Yes  Other Topics Concern   Not on file  Social History Narrative   Full time.    Social Determinants of Health   Financial Resource Strain: Not on file  Food Insecurity: Not on file  Transportation Needs: Not on file  Physical Activity: Not on file  Stress: Not on file  Social Connections: Unknown (08/15/2021)   Received from [facility], [facility]   Social Network    Social Network: Not on file  Intimate Partner Violence: Unknown (07/08/2021)   Received from [employer], [facility]   HITS    Physically Hurt: Not on file    Insult or Talk Down To: Not on file    Threaten Physical Harm: Not on file    Scream or Curse: Not on file    Family History  Problem Relation Age of Onset   Hypertension Mother    Heart failure Mother    Heart attack Father    Hypertension Sister    Coronary artery disease Other    Diabetes Other        siblings   Colon cancer Neg Hx    Rectal cancer Neg Hx    Stomach cancer Neg Hx    Esophageal cancer Neg Hx    Pancreatic cancer Neg Hx    Liver cancer Neg Hx     ROS: no fevers or chills, productive cough, hemoptysis, dysphasia, odynophagia, melena, hematochezia, dysuria, hematuria, rash, seizure activity, orthopnea, PND, pedal edema, claudication. Remaining systems are negative.  Physical Exam: Well-developed  well-nourished in no acute distress.  Skin is warm and dry.  HEENT is normal.  Neck is supple.  Chest is clear to auscultation with normal expansion.  Cardiovascular exam is regular rate and rhythm.  Abdominal exam nontender or distended. No masses palpated. Extremities show no edema. neuro grossly intact   A/P  1 paroxysmal atrial fibrillation-patient is status post ablation and remains in sinus rhythm on exam.  Continue beta-blocker and apixaban.  2 hypertrophic cardiomyopathy-continue beta-blocker.  He is planning to undergo genetic testing for his daughter.  3 ICD-managed by electrophysiology.  4 hypertension-patient's blood pressure is mildly elevated;   5 hyperlipidemia-continue statin.  6  coronary calcification-continue statin.  7 palpitations-continue beta-blocker.  Olga Millers, MD

## 2022-12-09 ENCOUNTER — Ambulatory Visit: Payer: PRIVATE HEALTH INSURANCE | Attending: Cardiology | Admitting: Cardiology

## 2022-12-09 ENCOUNTER — Encounter: Payer: Self-pay | Admitting: Cardiology

## 2022-12-09 VITALS — BP 142/82 | HR 70 | Ht 70.0 in | Wt 211.0 lb

## 2022-12-09 DIAGNOSIS — Z95810 Presence of automatic (implantable) cardiac defibrillator: Secondary | ICD-10-CM

## 2022-12-09 DIAGNOSIS — E7800 Pure hypercholesterolemia, unspecified: Secondary | ICD-10-CM | POA: Diagnosis not present

## 2022-12-09 DIAGNOSIS — I2510 Atherosclerotic heart disease of native coronary artery without angina pectoris: Secondary | ICD-10-CM | POA: Diagnosis not present

## 2022-12-09 DIAGNOSIS — I421 Obstructive hypertrophic cardiomyopathy: Secondary | ICD-10-CM

## 2022-12-09 DIAGNOSIS — I480 Paroxysmal atrial fibrillation: Secondary | ICD-10-CM

## 2022-12-09 DIAGNOSIS — I10 Essential (primary) hypertension: Secondary | ICD-10-CM

## 2022-12-09 NOTE — Patient Instructions (Signed)
Medication Instructions:   Your physician recommends that you continue on your current medications as directed. Please refer to the Current Medication list given to you today.   *If you need a refill on your cardiac medications before your next appointment, please call your pharmacy*   Lab Work:  None ordered.  If you have labs (blood work) drawn today and your tests are completely normal, you will receive your results only by: MyChart Message (if you have MyChart) OR A paper copy in the mail If you have any lab test that is abnormal or we need to change your treatment, we will call you to review the results.   Testing/Procedures:  None ordered.   Follow-Up: At [facility], you and your health needs are our priority.  As part of our continuing mission to provide you with exceptional heart care, we have created designated Provider Care Teams.  These Care Teams include your primary Cardiologist (physician) and Advanced Practice Providers (APPs -  Physician Assistants and Nurse Practitioners) who all work together to provide you with the care you need, when you need it.  We recommend signing up for the patient portal called "MyChart".  Sign up information is provided on this After Visit Summary.  MyChart is used to connect with patients for Virtual Visits (Telemedicine).  Patients are able to view lab/test results, encounter notes, upcoming appointments, etc.  Non-urgent messages can be sent to your provider as well.   To learn more about what you can do with MyChart, go to [email or web address].    Your next appointment:   6 month(s)  Provider:   Olga Millers, MD

## 2022-12-24 ENCOUNTER — Encounter: Payer: Self-pay | Admitting: Medical-Surgical

## 2022-12-24 ENCOUNTER — Ambulatory Visit (INDEPENDENT_AMBULATORY_CARE_PROVIDER_SITE_OTHER): Payer: PRIVATE HEALTH INSURANCE | Admitting: Medical-Surgical

## 2022-12-24 VITALS — BP 109/73 | HR 74 | Resp 20 | Ht 70.0 in | Wt 208.0 lb

## 2022-12-24 DIAGNOSIS — M2550 Pain in unspecified joint: Secondary | ICD-10-CM

## 2022-12-24 DIAGNOSIS — M1A9XX0 Chronic gout, unspecified, without tophus (tophi): Secondary | ICD-10-CM

## 2022-12-24 DIAGNOSIS — R7989 Other specified abnormal findings of blood chemistry: Secondary | ICD-10-CM

## 2022-12-24 DIAGNOSIS — G4733 Obstructive sleep apnea (adult) (pediatric): Secondary | ICD-10-CM | POA: Diagnosis not present

## 2022-12-24 DIAGNOSIS — E119 Type 2 diabetes mellitus without complications: Secondary | ICD-10-CM | POA: Diagnosis not present

## 2022-12-24 DIAGNOSIS — R3989 Other symptoms and signs involving the genitourinary system: Secondary | ICD-10-CM

## 2022-12-24 DIAGNOSIS — Z23 Encounter for immunization: Secondary | ICD-10-CM

## 2022-12-24 DIAGNOSIS — N529 Male erectile dysfunction, unspecified: Secondary | ICD-10-CM

## 2022-12-24 LAB — POCT URINALYSIS DIP (CLINITEK)
Bilirubin, UA: NEGATIVE
Blood, UA: NEGATIVE
Glucose, UA: NEGATIVE mg/dL
Ketones, POC UA: NEGATIVE mg/dL
Leukocytes, UA: NEGATIVE
Nitrite, UA: NEGATIVE
POC PROTEIN,UA: NEGATIVE
Spec Grav, UA: >=1.03 — AB (ref 1.010–1.025)
Urobilinogen, UA: 0.2 E.U./dL
pH, UA: 5.5 (ref 5.0–8.0)

## 2022-12-24 LAB — POCT UA - MICROALBUMIN
Albumin/Creatinine Ratio, Urine, POC: <30
Creatinine, POC: 300 mg/dL
Microalbumin Ur, POC: 30 mg/L

## 2022-12-24 LAB — POCT GLYCOSYLATED HEMOGLOBIN (HGB A1C)
HbA1c, POC (controlled diabetic range): 5.7 % (ref 0.0–7.0)
Hemoglobin A1C: 5.7 % — AB (ref 4.0–5.6)

## 2022-12-24 MED ORDER — DULOXETINE HCL 30 MG PO CPEP: 30.0000 mg | ORAL_CAPSULE | Freq: Every day | ORAL | 1 refills | Status: AC

## 2022-12-24 NOTE — Progress Notes (Signed)
        Established patient visit  History, exam, impression, and plan:  1. Type 2 diabetes mellitus without complication, without long-term current use of insulin [facility]) Pleasant 62 year old male presenting today with a history of type II but diabetes that was diagnosed in March of this year.  Since then he has been taking Ozempic 1 mg weekly, tolerating well without side effects.  Has made changes in his diet and notes approximately 20 pound weight loss since starting Ozempic.  He does have a little bit of nausea surrounding the initial day of the injection however this quickly resolves.  Has been checking sugars intermittently at home and notes that his readings are at goal.  POCT hemoglobin A1c recheck today with a result of 5.7% indicating excellent control.  POCT microalbumin normal.  Overdue for diabetic eye exam but has an appointment that he plans to schedule soon and will have that sent to [place].  Continue Ozempic 1 mg weekly and monitor glucose at home with a goal of 90-120 for fasting levels. - POCT HgB A1C - POCT UA - Microalbumin  2. Chronic gout involving toe of right foot without tophus, unspecified cause History of chronic gout currently treated with allopurinol 100 mg twice daily.  No recent flares.  Checking labs today.  Continue allopurinol. - CMP14+EGFR - Uric acid  3. Erectile dysfunction, unspecified erectile dysfunction type Has Cialis that he uses 5 mg daily as needed.  Feels medication works well and has no complaints.  Continue Cialis.  4. Obstructive sleep apnea syndrome Has a CPAP machine at home with [employer] supplies however does not use this regularly.  Has given thought to cleaning up lately so that he can restart use but has not done so yet.  Recommend restarting CPAP use.  5. Abnormal thyroid blood test Last thyroid test showed elevated TSH with normal free T4.  Rechecking TSH today. - TSH  6.  Arthralgia Continues to have chronic joint and musculoskeletal  pains, worse after playing golf.  Cannot take NSAIDs due to anticoagulation.  Tylenol is not effective.  Interested in options to treat/prevent this.  Discussed options of gabapentin, Lyrica, and Cymbalta.  He would like to avoid gabapentin due to sedation.  Plan to start Cymbalta 30 mg daily to see if this is helpful.  We will touch base via MyChart message in approximately 4 weeks to see how he is doing.  Procedures performed this visit: None.  Return in about 6 months (around 06/23/2023) for DM follow up.  __________________________________ Thayer Ohm, DNP, APRN, FNP-BC Primary Care and Sports Medicine [facility]

## 2022-12-25 LAB — CMP14+EGFR
ALT: 50 IU/L — ABNORMAL HIGH (ref 0–44)
AST: 33 IU/L (ref 0–40)
Albumin: 4.7 g/dL (ref 3.9–4.9)
Alkaline Phosphatase: 89 IU/L (ref 44–121)
BUN/Creatinine Ratio: 18 (ref 10–24)
BUN: 19 mg/dL (ref 8–27)
Bilirubin Total: 1.2 mg/dL (ref 0.0–1.2)
CO2: 22 mmol/L (ref 20–29)
Calcium: 10.1 mg/dL (ref 8.6–10.2)
Chloride: 101 mmol/L (ref 96–106)
Creatinine, Ser: 1.03 mg/dL (ref 0.76–1.27)
Globulin, Total: 2.4 g/dL (ref 1.5–4.5)
Glucose: 102 mg/dL — ABNORMAL HIGH (ref 70–99)
Potassium: 5.1 mmol/L (ref 3.5–5.2)
Sodium: 139 mmol/L (ref 134–144)
Total Protein: 7.1 g/dL (ref 6.0–8.5)
eGFR: 81 mL/min/{1.73_m2} (ref >59)

## 2022-12-25 LAB — TSH: TSH: 2.77 u[IU]/mL (ref 0.450–4.500)

## 2022-12-25 LAB — URIC ACID: Uric Acid: 4.6 mg/dL (ref 3.8–8.4)

## 2022-12-26 LAB — URINE CULTURE: Organism ID, Bacteria: NO GROWTH

## 2023-01-03 ENCOUNTER — Other Ambulatory Visit: Payer: Self-pay | Admitting: Cardiology

## 2023-01-03 DIAGNOSIS — I421 Obstructive hypertrophic cardiomyopathy: Secondary | ICD-10-CM

## 2023-01-04 ENCOUNTER — Encounter: Payer: Self-pay | Admitting: Medical-Surgical

## 2023-01-08 MED ORDER — PREGABALIN 50 MG PO CAPS
50.0000 mg | ORAL_CAPSULE | Freq: Two times a day (BID) | ORAL | 1 refills | Status: DC
Start: 2023-01-08 — End: 2023-02-02

## 2023-01-12 ENCOUNTER — Other Ambulatory Visit: Payer: Self-pay | Admitting: Medical-Surgical

## 2023-02-02 MED ORDER — PREGABALIN 100 MG PO CAPS
100.0000 mg | ORAL_CAPSULE | Freq: Two times a day (BID) | ORAL | 1 refills | Status: DC
Start: 2023-02-02 — End: 2023-04-05

## 2023-02-02 NOTE — Addendum Note (Signed)
Addended byChristen Butter on: 02/02/2023 07:45 AM   Modules accepted: Orders

## 2023-02-04 ENCOUNTER — Ambulatory Visit (INDEPENDENT_AMBULATORY_CARE_PROVIDER_SITE_OTHER): Payer: PRIVATE HEALTH INSURANCE

## 2023-02-04 DIAGNOSIS — I421 Obstructive hypertrophic cardiomyopathy: Secondary | ICD-10-CM | POA: Diagnosis not present

## 2023-02-04 LAB — CUP PACEART REMOTE DEVICE CHECK
Date Time Interrogation Session: 20241031080226
Implantable Lead Connection Status: 753985
Implantable Lead Implant Date: 20230502
Implantable Lead Location: 753860
Implantable Lead Model: 436909
Implantable Lead Serial Number: 81487767
Implantable Pulse Generator Implant Date: 20230502
Pulse Gen Model: 429525
Pulse Gen Serial Number: 84895165

## 2023-02-12 ENCOUNTER — Other Ambulatory Visit: Payer: Self-pay | Admitting: Medical-Surgical

## 2023-02-12 DIAGNOSIS — M1A9XX0 Chronic gout, unspecified, without tophus (tophi): Secondary | ICD-10-CM

## 2023-02-17 NOTE — Progress Notes (Signed)
Remote ICD transmission.   

## 2023-03-24 NOTE — Telephone Encounter (Signed)
Care Team updated. Can request records after exam appt in January.

## 2023-03-29 ENCOUNTER — Other Ambulatory Visit: Payer: Self-pay | Admitting: Internal Medicine

## 2023-03-29 DIAGNOSIS — I480 Paroxysmal atrial fibrillation: Secondary | ICD-10-CM

## 2023-03-29 NOTE — Telephone Encounter (Signed)
Eliquis 5mg  refill request received. Patient is 63 years old, weight-94.3kg, Crea- 1.03 on 12/24/22, Diagnosis-Afib, and last seen by Dr Jens Som on 12/09/22. Dose is appropriate based on dosing criteria. Will send in refill to requested pharmacy.

## 2023-04-05 ENCOUNTER — Encounter: Payer: Self-pay | Admitting: Medical-Surgical

## 2023-04-05 MED ORDER — PREGABALIN 150 MG PO CAPS: 150.0000 mg | ORAL_CAPSULE | Freq: Three times a day (TID) | ORAL | 1 refills | Status: DC

## 2023-04-12 ENCOUNTER — Encounter: Payer: Self-pay | Admitting: Dermatology

## 2023-04-12 ENCOUNTER — Ambulatory Visit (INDEPENDENT_AMBULATORY_CARE_PROVIDER_SITE_OTHER): Payer: PRIVATE HEALTH INSURANCE | Admitting: Dermatology

## 2023-04-12 VITALS — BP 117/76 | HR 71

## 2023-04-12 DIAGNOSIS — C4441 Basal cell carcinoma of skin of scalp and neck: Secondary | ICD-10-CM

## 2023-04-12 DIAGNOSIS — D1801 Hemangioma of skin and subcutaneous tissue: Secondary | ICD-10-CM

## 2023-04-12 DIAGNOSIS — L814 Other melanin hyperpigmentation: Secondary | ICD-10-CM | POA: Diagnosis not present

## 2023-04-12 DIAGNOSIS — L820 Inflamed seborrheic keratosis: Secondary | ICD-10-CM

## 2023-04-12 DIAGNOSIS — D492 Neoplasm of unspecified behavior of bone, soft tissue, and skin: Secondary | ICD-10-CM

## 2023-04-12 DIAGNOSIS — Z85828 Personal history of other malignant neoplasm of skin: Secondary | ICD-10-CM

## 2023-04-12 DIAGNOSIS — L578 Other skin changes due to chronic exposure to nonionizing radiation: Secondary | ICD-10-CM

## 2023-04-12 DIAGNOSIS — W908XXA Exposure to other nonionizing radiation, initial encounter: Secondary | ICD-10-CM

## 2023-04-12 DIAGNOSIS — D485 Neoplasm of uncertain behavior of skin: Secondary | ICD-10-CM

## 2023-04-12 DIAGNOSIS — L821 Other seborrheic keratosis: Secondary | ICD-10-CM

## 2023-04-12 DIAGNOSIS — C44519 Basal cell carcinoma of skin of other part of trunk: Secondary | ICD-10-CM

## 2023-04-12 DIAGNOSIS — Z1283 Encounter for screening for malignant neoplasm of skin: Secondary | ICD-10-CM | POA: Diagnosis not present

## 2023-04-12 DIAGNOSIS — D045 Carcinoma in situ of skin of trunk: Secondary | ICD-10-CM | POA: Diagnosis not present

## 2023-04-12 DIAGNOSIS — D229 Melanocytic nevi, unspecified: Secondary | ICD-10-CM

## 2023-04-12 MED ORDER — KLISYRI (250 MG) 1 % EX OINT: TOPICAL_OINTMENT | CUTANEOUS | 3 refills | Status: AC

## 2023-04-12 NOTE — Patient Instructions (Addendum)
Patient Handout: Wound Care for Skin Biopsy Site  Taking Care of Your Skin Biopsy Site  Proper care of the biopsy site is essential for promoting healing and minimizing scarring. This handout provides instructions on how to care for your biopsy site to ensure optimal recovery.  1. Cleaning the Wound:  Clean the biopsy site daily with gentle soap and water. Gently pat the area dry with a clean, soft towel. Avoid harsh scrubbing or rubbing the area, as this can irritate the skin and delay healing.  2. Applying Aquaphor and Bandage:  After cleaning the wound, apply a thin layer of Aquaphor ointment to the biopsy site. Cover the area with a sterile bandage to protect it from dirt, bacteria, and friction. Change the bandage daily or as needed if it becomes soiled or wet.  3. Continued Care for One Week:  Repeat the cleaning, Aquaphor application, and bandaging process daily for one week following the biopsy procedure. Keeping the wound clean and moist during this initial healing period will help prevent infection and promote optimal healing.  4. Massaging Aquaphor into the Area:  ---After one week, discontinue the use of bandages but continue to apply Aquaphor to the biopsy site. ----Gently massage the Aquaphor into the area using circular motions. ---Massaging the skin helps to promote circulation and prevent the formation of scar tissue.   Additional Tips:  Avoid exposing the biopsy site to direct sunlight during the healing process, as this can cause hyperpigmentation or worsen scarring. If you experience any signs of infection, such as increased redness, swelling, warmth, or drainage from the wound, contact your healthcare provider immediately. Follow any additional instructions provided by your healthcare provider for caring for the biopsy site and managing any discomfort. Conclusion:  Taking proper care of your skin biopsy site is crucial for ensuring optimal healing and  minimizing scarring. By following these instructions for cleaning, applying Aquaphor, and massaging the area, you can promote a smooth and successful recovery. If you have any questions or concerns about caring for your biopsy site, don't hesitate to contact your healthcare provider for guidance.    Efudex Instructions  1. Apply a thin layer of Efudex cream to the specified sun  damaged area twice a day. The medication is to be applied to the entire  area, not just the keratoses (rough spots). Wash hands thoroughly after  application! 2. Use Efudex twice daily for 2 weeks to 4 weeks (depending on the  physician directions) or once a day for 4 weeks (depending on the  physician directions).  3. The skin will become red and scaly. This is the appropriate response.  4. Once redness starts, you may begin using Hydrocortisone 1% Cream (or  other topical steroid provided by the physician) twice a day. This helps  relieve pain and will speed the healing process. It may be applied as soon  as 30 minutes after Efudex application. 5. For excessive reactions or if suspicious for an infection or allergic  reaction, please call to make an appointment at my office so that I may  evaluate your response to treatment. 6. The area may be washed as you normally would with soap and water. Do  not wash the face for at least three hours after Efudex  application. 7. Ladies: [place] is permitted throughout the entire treatment period. Wait  30 minutes after Efudex use before applying makeup directly on top of the  medication. 8. Please read the instructional pamphlet provided by the [employer] so that you may familiarize yourself with the kinds of reactions  you may experience. 9. If you have had a biopsy on any area that you will use the medication on,  DO NOT start the Efudex until your results are given to you and  the medical assistant lets you know it is okay to start the treatment     Important Information   Due to recent changes in healthcare laws, you may see results of your pathology and/or laboratory studies on MyChart before the doctors have had a chance to review them. We understand that in some cases there may be results that are confusing or concerning to you. Please understand that not all results are received at the same time and often the doctors may need to interpret multiple results in order to provide you with the best plan of care or course of treatment. Therefore, we ask that you please give [place] 2 business days to thoroughly review all your results before contacting the office for clarification. Should we see a critical lab result, you will be contacted sooner.     If You Need Anything After Your Visit   If you have any questions or concerns for your doctor, please call our main line at [phone]. If no one answers, please leave a voicemail as directed and we will return your call as soon as possible. Messages left after 4 pm will be answered the following business day.    You may also send [place] a message via MyChart. We typically respond to MyChart messages within 1-2 business days.  For prescription refills, please ask your pharmacy to contact our office. Our fax number is [phone].  If you have an urgent issue when the clinic is closed that cannot wait until the next business day, you can page your doctor at the number below.     Please note that while we do our best to be available for urgent issues outside of office hours, we are not available 24/7.    If you have an urgent issue and are unable to reach [place], you may choose to seek medical care at your doctor's office, retail clinic, urgent care center, or emergency room.   If you have a medical emergency, please immediately call 911 or go to the emergency department. In the event of inclement weather, please call our main line at [phone] for an update on the status of any delays or  closures.  Dermatology Medication Tips: Please keep the boxes that topical medications come in in order to help keep track of the instructions about where and how to use these. Pharmacies typically print the medication instructions only on the boxes and not directly on the medication tubes.   If your medication is too expensive, please contact our office at [phone] or send [place] a message through MyChart.    We are unable to tell what your co-pay for medications will be in advance as this is different depending on your insurance coverage. However, we may be able to find a substitute medication at lower cost or fill out paperwork to get insurance to cover a needed medication.    If a prior authorization is required to get your medication covered by your insurance company, please allow [place] 1-2 business days to complete this process.   Drug prices often vary depending on where the prescription is filled and some pharmacies may offer cheaper prices.   The website www.goodrx.com contains coupons for medications through different pharmacies. The  prices here do not account for what the cost may be with help from insurance (it may be cheaper with your insurance), but the website can give you the price if you did not use any insurance.  - You can print the associated coupon and take it with your prescription to the pharmacy.  - You may also stop by our office during regular business hours and pick up a GoodRx coupon card.  - If you need your prescription sent electronically to a different pharmacy, notify our office through [facility] or by phone at [phone]

## 2023-04-12 NOTE — Progress Notes (Addendum)
 New Patient Visit   Subjective  Dale Rivera is a 63 y.o. male who presents for the following: Skin Cancer Screening and Full Body Skin Exam  The patient presents for Total-Body Skin Exam (TBSE) for skin cancer screening and mole check. The patient has spots, moles and lesions to be evaluated, some may be new or changing and the patient may have concern these could be cancer. Pt has hx of skin cancer which was treated with [facility] but no family hx. Spot on right preauricular area over 2 years ago and spot under right eye over 2 years ago both treated at [facility].   The following portions of the chart were reviewed this encounter and updated as appropriate: medications, allergies, medical history  Review of Systems:  No other skin or systemic complaints except as noted in HPI or Assessment and Plan.  Objective  Well appearing patient in no apparent distress; mood and affect are within normal limits.  A full examination was performed including scalp, head, eyes, ears, nose, lips, neck, chest, axillae, abdomen, back, buttocks, bilateral upper extremities, bilateral lower extremities, hands, feet, fingers, toes, fingernails, and toenails. All findings within normal limits unless otherwise noted below.   Relevant physical exam findings are noted in the Assessment and Plan.  Right  Neck 6mm pearly papule with specks of pigment  Left Upper Back 5mm brown papule  superior sternum 6mm pink pearly plaque  Left clavicle 5mm irregular pearly papule   Assessment & Plan   SKIN CANCER SCREENING PERFORMED TODAY.  ACTINIC DAMAGE - Chronic condition, secondary to cumulative UV/sun exposure - diffuse scaly erythematous macules with underlying dyspigmentation - Recommend daily broad spectrum sunscreen SPF 30+ to sun-exposed areas, reapply every 2 hours as needed.  - Staying in the shade or wearing long sleeves, sun glasses (UVA+UVB protection) and wide brim hats (4-inch brim around the entire  circumference of the hat) are also recommended for sun protection.  - Call for new or changing lesions.  -Apply Kliseri to arms and temples twice a day for 2 weeks  MELANOCYTIC NEVI - Tan-brown and/or pink-flesh-colored symmetric macules and papules - Benign appearing on exam today - Observation - Call clinic for new or changing moles - Recommend daily use of broad spectrum spf 30+ sunscreen to sun-exposed areas.   SEBORRHEIC KERATOSIS - Stuck-on, waxy, tan-brown papules and/or plaques  - Benign-appearing - Discussed benign etiology and prognosis. - Observe - Call for any changes  LENTIGINES Exam: scattered tan macules Due to sun exposure Treatment Plan: Benign-appearing, observe. Recommend daily broad spectrum sunscreen SPF 30+ to sun-exposed areas, reapply every 2 hours as needed.  Call for any changes    HEMANGIOMA Exam: red papule(s) Discussed benign nature. Recommend observation. Call for changes.     NEOPLASM OF UNCERTAIN BEHAVIOR OF SKIN (4) Right  Neck Skin / nail biopsy Type of biopsy: tangential   Informed consent: discussed and consent obtained   Timeout: patient name, date of birth, surgical site, and procedure verified   Procedure prep:  Patient was prepped and draped in usual sterile fashion Prep type:  Isopropyl alcohol Anesthesia: the lesion was anesthetized in a standard fashion   Anesthetic:  1% lidocaine w/ epinephrine 1-100,000 buffered w/ 8.4% NaHCO3 Instrument used: DermaBlade   Hemostasis achieved with: aluminum chloride   Outcome: patient tolerated procedure well   Post-procedure details: sterile dressing applied and wound care instructions given   Dressing type: petrolatum gauze and bandage   Specimen 1 - Surgical pathology Differential Diagnosis:  R/O pigmented BCC  Check Margins: No Left Upper Back Skin / nail biopsy Type of biopsy: tangential   Informed consent: discussed and consent obtained   Timeout: patient name, date of birth,  surgical site, and procedure verified   Procedure prep:  Patient was prepped and draped in usual sterile fashion Prep type:  Isopropyl alcohol Anesthesia: the lesion was anesthetized in a standard fashion   Anesthetic:  1% lidocaine w/ epinephrine 1-100,000 buffered w/ 8.4% NaHCO3 Instrument used: DermaBlade   Hemostasis achieved with: aluminum chloride   Outcome: patient tolerated procedure well   Post-procedure details: sterile dressing applied and wound care instructions given   Dressing type: petrolatum gauze and bandage    Skin / nail biopsy Type of biopsy: tangential   Informed consent: discussed and consent obtained   Timeout: patient name, date of birth, surgical site, and procedure verified   Procedure prep:  Patient was prepped and draped in usual sterile fashion Prep type:  Isopropyl alcohol Anesthesia: the lesion was anesthetized in a standard fashion   Anesthetic:  1% lidocaine w/ epinephrine 1-100,000 buffered w/ 8.4% NaHCO3 Instrument used: DermaBlade   Hemostasis achieved with: aluminum chloride   Outcome: patient tolerated procedure well   Post-procedure details: sterile dressing applied and wound care instructions given   Dressing type: petrolatum gauze and bandage    Skin / nail biopsy Type of biopsy: tangential   Informed consent: discussed and consent obtained   Timeout: patient name, date of birth, surgical site, and procedure verified   Procedure prep:  Patient was prepped and draped in usual sterile fashion Prep type:  Isopropyl alcohol Anesthesia: the lesion was anesthetized in a standard fashion   Anesthetic:  1% lidocaine w/ epinephrine 1-100,000 buffered w/ 8.4% NaHCO3 Instrument used: DermaBlade   Hemostasis achieved with: aluminum chloride   Outcome: patient tolerated procedure well   Post-procedure details: sterile dressing applied and wound care instructions given   Dressing type: petrolatum gauze and bandage   Specimen 2 - Surgical  pathology Differential Diagnosis: R/O DN  Check Margins: No superior sternum Skin / nail biopsy Type of biopsy: tangential   Informed consent: discussed and consent obtained   Timeout: patient name, date of birth, surgical site, and procedure verified   Procedure prep:  Patient was prepped and draped in usual sterile fashion Prep type:  Isopropyl alcohol Anesthesia: the lesion was anesthetized in a standard fashion   Anesthetic:  1% lidocaine w/ epinephrine 1-100,000 buffered w/ 8.4% NaHCO3 Instrument used: DermaBlade   Hemostasis achieved with: aluminum chloride   Outcome: patient tolerated procedure well   Post-procedure details: sterile dressing applied and wound care instructions given   Dressing type: petrolatum gauze and bandage   Specimen 3 - Surgical pathology Differential Diagnosis: R/O BCC  Check Margins: No Left clavicle Skin / nail biopsy Type of biopsy: tangential   Informed consent: discussed and consent obtained   Timeout: patient name, date of birth, surgical site, and procedure verified   Procedure prep:  Patient was prepped and draped in usual sterile fashion Prep type:  Isopropyl alcohol Anesthesia: the lesion was anesthetized in a standard fashion   Anesthetic:  1% lidocaine w/ epinephrine 1-100,000 buffered w/ 8.4% NaHCO3 Instrument used: DermaBlade   Hemostasis achieved with: aluminum chloride   Outcome: patient tolerated procedure well   Post-procedure details: sterile dressing applied and wound care instructions given   Dressing type: petrolatum gauze and bandage   Specimen 4 - Surgical pathology Differential Diagnosis: R/O BCC  Check  Margins: No SKIN EXAM FOR MALIGNANT NEOPLASM   MULTIPLE BENIGN MELANOCYTIC NEVI   CHERRY ANGIOMA   LENTIGINES   ACTINIC SKIN DAMAGE   SEBORRHEIC KERATOSIS    Return in about 1 year (around 04/11/2024) for TBSE.  Owens Shark, CMA, am acting as scribe for [employer], DO.   Documentation: I have  reviewed the above documentation for accuracy and completeness, and I agree with the above.  Langston Reusing, DO

## 2023-04-14 ENCOUNTER — Other Ambulatory Visit: Payer: Self-pay | Admitting: Medical-Surgical

## 2023-04-14 LAB — SURGICAL PATHOLOGY

## 2023-04-15 ENCOUNTER — Telehealth: Payer: Self-pay

## 2023-04-15 DIAGNOSIS — C4491 Basal cell carcinoma of skin, unspecified: Secondary | ICD-10-CM

## 2023-04-15 DIAGNOSIS — C4492 Squamous cell carcinoma of skin, unspecified: Secondary | ICD-10-CM

## 2023-04-15 DIAGNOSIS — D099 Carcinoma in situ, unspecified: Secondary | ICD-10-CM | POA: Insufficient documentation

## 2023-04-15 HISTORY — DX: Basal cell carcinoma of skin, unspecified: C44.91

## 2023-04-15 HISTORY — DX: Squamous cell carcinoma of skin, unspecified: C44.92

## 2023-04-15 HISTORY — DX: Carcinoma in situ, unspecified: D09.9

## 2023-04-15 NOTE — Telephone Encounter (Signed)
-----   Message from Delon Lenis sent at 04/15/2023  9:26 AM EST ----- Hi Jeanae Whitmill,   Please call pt and notify their bx results were positive for a skin CA that will be treated by Dr. Corey and Dr. Lenis  Diagnosis 1. 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 Skin , superior sternum  --> SE w/ Dr Lenis BASAL CELL CARCINOMA, NODULAR PATTERN  4. Skin , left clavicle --> Benign SEBORRHEIC KERATOSIS, INFLAMED

## 2023-04-15 NOTE — Telephone Encounter (Signed)
-----   Message from Delon Lenis sent at 04/15/2023  9:26 AM EST ----- Hi Dale Rivera,   Please call pt and notify their bx results were positive for a skin CA that will be treated by Dr. Corey and Dr. Lenis  Diagnosis 1. 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 Skin , superior sternum  --> SE w/ Dr Lenis BASAL CELL CARCINOMA, NODULAR PATTERN  4. Skin , left clavicle --> Benign SEBORRHEIC KERATOSIS, INFLAMED

## 2023-04-15 NOTE — Progress Notes (Signed)
 Hi Shirron,   Please call pt and notify their bx results were positive for a skin CA that will be treated by Dr. Corey and Dr. Alm  Diagnosis 1. 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 Skin , left upper back --> ED&C w/ Dr Alm PIGMENTED SQUAMOUS CELL CARCINOMA IN SITU  3. Skin , superior sternum  --> SE w/ Dr Alm BASAL CELL CARCINOMA, NODULAR PATTERN  4. Skin , left clavicle --> Benign SEBORRHEIC KERATOSIS, INFLAMED

## 2023-04-15 NOTE — Telephone Encounter (Signed)
 LVM for pt to discuss Bx results  ST & History updated

## 2023-04-15 NOTE — Telephone Encounter (Signed)
 Advised patient of results and scheduled surgery and EDC with Dr Onalee Hua. We wil schedule Dale Rivera for Mohs with Dr Caralyn Guile for [facility] of neck

## 2023-04-15 NOTE — Telephone Encounter (Addendum)
Onalee Hua, DO 04/27/2023 at 12:30 PM for [facility]

## 2023-04-26 NOTE — Progress Notes (Unsigned)
  Electrophysiology Office Note:   ID:  Dale Rivera, Dale Rivera 11/20/1960, MRN [id number]  Primary Cardiologist: Olga Millers, MD Electrophysiologist: Lanier Prude, MD  {Click to update primary MD,subspecialty MD or APP then REFRESH:1}    History of Present Illness:   Dale Rivera is a 63 y.o. male with h/o persistent AF and HOCM s/p ICD seen today for routine electrophysiology followup.   Since last being seen in our clinic the patient reports doing ***.  he denies chest pain, palpitations, dyspnea, PND, orthopnea, nausea, vomiting, dizziness, syncope, edema, weight gain, or early satiety.   Review of systems complete and found to be negative unless listed in HPI.   EP Information / Studies Reviewed:    EKG is ordered today. Personal review as below.       ICD Interrogation-  reviewed in detail today,  See PACEART report.  Device History: Biotronik Single Chamber ICD implanted 08/2021 for HOCM  Echo 07/2021 1. Severe asymmetric LV hypertrophy measuring up to 23mm in septum (11mm in posterior wall), consistent with hypertrophic cardiomyopathy 2. Patchy late gadolinium enhancement primarily at RV insertion sites, consistent with hypertrophic cardiomyopathy. LGE accounts for 10% of total myocardial mass 3.  Normal LV size with low normal systolic function (EF 51%) 4.  Normal RV size and systolic function (EF 58%)  Echo 06/2022 Normal LV function, unchanged LVH and no LVOT gradient at rest.  Arrhythmia History  S/p PVI and CTI 11/2021  Physical Exam:   VS:  There were no vitals taken for this visit.   Wt Readings from Last 3 Encounters:  12/24/22 208 lb (94.3 kg)  12/09/22 211 lb (95.7 kg)  06/23/22 226 lb 3.2 oz (102.6 kg)     GEN: No acute distress *** NECK: No JVD; No carotid bruits CARDIAC: {EPRHYTHM:28826}, no murmurs, rubs, gallops RESPIRATORY:  Clear to auscultation without rales, wheezing or rhonchi  ABDOMEN: Soft, non-tender, non-distended EXTREMITIES:  {EDEMA  LEVEL:28147::"No"} edema; No deformity   ASSESSMENT AND PLAN:    HCM  s/p Biotronik single chamber ICD  euvolemic today Stable on an appropriate medical regimen Normal ICD function See Pace Art report No changes today  Persistent AF Continue eliquis 5 mg BID for CHA2DS2/VASc of at least 3 Maintaining NSR since ablation      Disposition:   Follow up with {EPPROVIDERS:28135} {EPFOLLOW UP:28173}   Signed, Graciella Freer, PA-C

## 2023-04-27 ENCOUNTER — Encounter: Payer: Self-pay | Admitting: Student

## 2023-04-27 ENCOUNTER — Encounter: Payer: Self-pay | Admitting: Dermatology

## 2023-04-27 ENCOUNTER — Ambulatory Visit: Payer: PRIVATE HEALTH INSURANCE | Attending: Student | Admitting: Student

## 2023-04-27 ENCOUNTER — Ambulatory Visit: Payer: PRIVATE HEALTH INSURANCE | Admitting: Dermatology

## 2023-04-27 VITALS — BP 132/86

## 2023-04-27 VITALS — BP 110/60 | HR 72 | Ht 70.0 in | Wt 220.4 lb

## 2023-04-27 DIAGNOSIS — D099 Carcinoma in situ, unspecified: Secondary | ICD-10-CM

## 2023-04-27 DIAGNOSIS — L570 Actinic keratosis: Secondary | ICD-10-CM

## 2023-04-27 DIAGNOSIS — C44519 Basal cell carcinoma of skin of other part of trunk: Secondary | ICD-10-CM

## 2023-04-27 DIAGNOSIS — D045 Carcinoma in situ of skin of trunk: Secondary | ICD-10-CM | POA: Diagnosis not present

## 2023-04-27 DIAGNOSIS — I421 Obstructive hypertrophic cardiomyopathy: Secondary | ICD-10-CM

## 2023-04-27 DIAGNOSIS — C4441 Basal cell carcinoma of skin of scalp and neck: Secondary | ICD-10-CM

## 2023-04-27 DIAGNOSIS — I480 Paroxysmal atrial fibrillation: Secondary | ICD-10-CM

## 2023-04-27 LAB — CUP PACEART INCLINIC DEVICE CHECK
Date Time Interrogation Session: 20250121131534
Implantable Lead Connection Status: 753985
Implantable Lead Implant Date: 20230502
Implantable Lead Location: 753860
Implantable Lead Model: 436909
Implantable Lead Serial Number: 81487767
Implantable Pulse Generator Implant Date: 20230502
Pulse Gen Model: 429525
Pulse Gen Serial Number: 84895165

## 2023-04-27 MED ORDER — TRIAMCINOLONE ACETONIDE 0.025 % EX OINT: 1.0000 | TOPICAL_OINTMENT | Freq: Two times a day (BID) | CUTANEOUS | 0 refills | Status: DC

## 2023-04-27 MED ORDER — METOPROLOL SUCCINATE ER 100 MG PO TB24: 100.0000 mg | ORAL_TABLET | Freq: Every evening | ORAL | 3 refills | Status: AC

## 2023-04-27 NOTE — Progress Notes (Addendum)
   Follow-Up Visit   Subjective  Dale Rivera is a 63 y.o. 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  Objective  Well appearing patient in no apparent distress; mood and affect are within normal limits.   A focused examination was performed of the following areas: Back  Relevant exam findings are noted in the Assessment and Plan.  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 Back Destruction of lesion Complexity: extensive   Destruction method: electrodesiccation and curettage   Timeout:  patient name, date of birth, surgical site, and procedure verified Procedure prep:  Patient was prepped and draped in usual sterile fashion Prep type:  Isopropyl alcohol Anesthesia: the lesion was anesthetized in a standard fashion   Anesthetic:  1% lidocaine w/ epinephrine 1-100,000 buffered w/ 8.4% NaHCO3 Curettage performed in three different directions: Yes   Electrodesiccation performed over the curetted area: Yes   Lesion length (cm):  0.5 Margin per side (cm):  0.3 Final wound size (cm):  1.1 Hemostasis achieved with:  pressure and aluminum chloride Outcome: patient tolerated procedure well with no complications   Post-procedure details: sterile dressing applied and wound care instructions given   Dressing type: bandage and petrolatum   Biopsy proven SCC in situ BASAL CELL CARCINOMA (BCC) OF SKIN OF NECK Right neck Biopsy proven BCC  - will plan Mohs 05/27/2023 with Dr Caralyn Guile BASAL CELL CARCINOMA (BCC) OF  SKIN OF OTHER PART OF TORSO Sup sternum Biopsy proven BCC - Will plan excision with Dr Caralyn Guile 05/11/2023 AK (ACTINIC KERATOSIS) Face triamcinolone (KENALOG) 0.025 % ointment - Face Apply 1 Application topically 2 (two) times daily. Apply to face x 7 days  Return for Follow up as scheduled.  I, Joanie Coddington, CMA, am acting as scribe for [employer], DO .   Documentation: I have reviewed the above documentation for accuracy and completeness, and I agree with the above.  Langston Reusing, DO

## 2023-04-27 NOTE — Progress Notes (Deleted)
   Follow-Up Visit   Subjective  Dale Rivera is a 63 y.o. male who presents for the following: [facility]  Patient present today for follow up visit. Patient was last evaluated on 04/12/23. Patient reports sxs are {DESC; BETTER/WORSE:18575}. Patient {Actions; denies-reports:120008} medication changes.  The following portions of the chart were reviewed this encounter and updated as appropriate: medications, allergies, medical history  Review of Systems:  No other skin or systemic complaints except as noted in HPI or Assessment and Plan.  Objective  Well appearing patient in no apparent distress; mood and affect are within normal limits.   A focused examination was performed of the following areas:    Relevant exam findings are noted in the Assessment and Plan.    Assessment & Plan       No follow-ups on file.  ***  Documentation: I have reviewed the above documentation for accuracy and completeness, and I agree with the above.  Langston Reusing, DO

## 2023-04-27 NOTE — Telephone Encounter (Signed)
NOVCaralyn Guile, MD 05/11/2023 at 12:45 PM

## 2023-04-27 NOTE — Patient Instructions (Signed)

## 2023-04-27 NOTE — Patient Instructions (Signed)
Medication Instructions:  Increase metoprolol succinate (Toprol XL) to 100 mg every evening. *If you need a refill on your cardiac medications before your next appointment, please call your pharmacy*  Lab Work: None ordered If you have labs (blood work) drawn today and your tests are completely normal, you will receive your results only by: MyChart Message (if you have MyChart) OR A paper copy in the mail If you have any lab test that is abnormal or we need to change your treatment, we will call you to review the results.   Follow-Up: At [facility], you and your health needs are our priority.  As part of our continuing mission to provide you with exceptional heart care, we have created designated Provider Care Teams.  These Care Teams include your primary Cardiologist (physician) and Advanced Practice Providers (APPs -  Physician Assistants and Nurse Practitioners) who all work together to provide you with the care you need, when you need it.  Your next appointment:   1 year(s)  Provider:   Steffanie Dunn, MD or Doreatha Martin, PA-C

## 2023-04-28 ENCOUNTER — Encounter: Payer: Self-pay | Admitting: Cardiology

## 2023-05-06 ENCOUNTER — Ambulatory Visit: Payer: PRIVATE HEALTH INSURANCE

## 2023-05-06 DIAGNOSIS — I421 Obstructive hypertrophic cardiomyopathy: Secondary | ICD-10-CM

## 2023-05-06 LAB — CUP PACEART REMOTE DEVICE CHECK
Date Time Interrogation Session: 20250130083828
Implantable Lead Connection Status: 753985
Implantable Lead Implant Date: 20230502
Implantable Lead Location: 753860
Implantable Lead Model: 436909
Implantable Lead Serial Number: 81487767
Implantable Pulse Generator Implant Date: 20230502
Pulse Gen Model: 429525
Pulse Gen Serial Number: 84895165

## 2023-05-06 LAB — HM DIABETES EYE EXAM

## 2023-05-09 ENCOUNTER — Encounter: Payer: Self-pay | Admitting: Cardiology

## 2023-05-10 ENCOUNTER — Encounter: Payer: Self-pay | Admitting: Dermatology

## 2023-05-11 ENCOUNTER — Ambulatory Visit (INDEPENDENT_AMBULATORY_CARE_PROVIDER_SITE_OTHER): Payer: PRIVATE HEALTH INSURANCE | Admitting: Dermatology

## 2023-05-11 ENCOUNTER — Encounter: Payer: Self-pay | Admitting: Dermatology

## 2023-05-11 VITALS — BP 111/72 | HR 69

## 2023-05-11 DIAGNOSIS — C4491 Basal cell carcinoma of skin, unspecified: Secondary | ICD-10-CM

## 2023-05-11 DIAGNOSIS — C44519 Basal cell carcinoma of skin of other part of trunk: Secondary | ICD-10-CM | POA: Diagnosis not present

## 2023-05-11 NOTE — Telephone Encounter (Signed)
NOV PACI, MD 05/27/2023 at 9:00 AM

## 2023-05-11 NOTE — Progress Notes (Signed)
 Follow-Up Visit   Subjective  Dale Rivera is a 63 y.o. male who presents for the following: Excision of a nodular BCC on the patient's superior sternum, biopsied by Dr. Alm.  The following portions of the chart were reviewed this encounter and updated as appropriate: medications, allergies, medical history  Review of Systems:  No other skin or systemic complaints except as noted in HPI or Assessment and Plan.  Objective  Well appearing patient in no apparent distress; mood and affect are within normal limits.  A focused examination was performed of the following areas: Super sternum Relevant physical exam findings are noted in the Assessment and Plan.   superior sternum Healing biopsy scar   Assessment & Plan   BASAL CELL CARCINOMA (BCC), UNSPECIFIED SITE superior sternum Skin excision  Excision method:  elliptical Lesion length (cm):  2.1 Lesion width (cm):  1 Margin per side (cm):  0.5 Total excision diameter (cm):  3.1 Informed consent: discussed and consent obtained   Timeout: patient name, date of birth, surgical site, and procedure verified   Procedure prep:  Patient was prepped and draped in usual sterile fashion Prep type:  Isopropyl alcohol Anesthesia: the lesion was anesthetized in a standard fashion   Anesthetic:  1% lidocaine  w/ epinephrine 1-100,000 buffered w/ 8.4% NaHCO3 Instrument used: #15 blade   Hemostasis achieved with: suture   Hemostasis achieved with comment:  3.0 monocryl with dermabond and steri strips Outcome: patient tolerated procedure well with no complications   Post-procedure details: sterile dressing applied and wound care instructions given   Dressing type: bandage and pressure dressing   Additional details:  Final length 7.1  Skin repair Complexity:  Complex Final length (cm):  7.1 Informed consent: discussed and consent obtained   Timeout: patient name, date of birth, surgical site, and procedure verified   Procedure prep:   Patient was prepped and draped in usual sterile fashion Prep type:  Chlorhexidine  Anesthesia: the lesion was anesthetized in a standard fashion   Anesthetic:  1% lidocaine  w/ epinephrine 1-100,000 buffered w/ 8.4% NaHCO3 Reason for type of repair: reduce tension to allow closure, preserve normal anatomy, avoid adjacent structures, allow side-to-side closure without requiring a flap or graft and compensate for the inelasticity of skin in this area   Subcutaneous layers (deep stitches):  Suture size:  3-0 Suture type: Monocryl (poliglecaprone 25)   Stitches:  Buried vertical mattress Fine/surface layer approximation (top stitches):  Suture type: cyanoacrylate tissue glue   Hemostasis achieved with: suture, pressure and electrodesiccation Outcome: patient tolerated procedure well with no complications   Post-procedure details: sterile dressing applied and wound care instructions given   Specimen 1 - Surgical pathology Differential Diagnosis: BCC [phone] Check Margins: No  The surgical wound was then cleaned, prepped, and re-anesthetized as above. Wound edges were undermined extensively along at least one entire edge and at a distance equal to or greater than the width of the defect (see wound defect size above) in order to achieve closure and decrease wound tension and anatomic distortion. Redundant tissue repair including standing cone removal was performed. Hemostasis was achieved with electrocautery. Subcutaneous and epidermal tissues were approximated with the above sutures. The surgical site was then lightly scrubbed with sterile, saline-soaked gauze. Steri-strips were applied, and the area was then bandaged using Vaseline ointment, non-adherent gauze, gauze pads, and tape to provide an adequate pressure dressing. The patient tolerated the procedure well, was given detailed written and verbal wound care instructions, and was discharged in good condition.  Return if symptoms worsen or  fail to improve.  I, Darice Smock, CMA, am acting as scribe for RUFUS CHRISTELLA HOLY, MD.   Documentation: I have reviewed the above documentation for accuracy and completeness, and I agree with the above.  RUFUS CHRISTELLA HOLY, MD

## 2023-05-11 NOTE — Patient Instructions (Signed)

## 2023-05-13 LAB — SURGICAL PATHOLOGY

## 2023-05-17 NOTE — Progress Notes (Signed)
 Surgical excision pathology report was reviewed and showed clear margins.  No additional treatment required.    I did the original bx however they put the path from Dr. Armando excision in my in basket and it was listed under my account.  I'm looping you both in Darice and Dr. Paci

## 2023-05-24 ENCOUNTER — Encounter: Payer: Self-pay | Admitting: Dermatology

## 2023-05-27 ENCOUNTER — Encounter: Payer: PRIVATE HEALTH INSURANCE | Admitting: Dermatology

## 2023-05-28 NOTE — Progress Notes (Signed)
 HPI: FU hypertrophic cardiomyopathy and atrial fibrillation. Nuclear study December 2022 showed normal perfusion and ejection fraction 33% but visually was normal. Cardiac MRI April 2023 showed severe asymmetric left ventricular hypertrophy with septum measuring 23 mm. Cardiac CTA August 2023 showed calcium score 664 which was 90th percentile.  Patient had ICD implant May 2023.  He had atrial fibrillation ablation August 2023.  Echocardiogram March 2024 showed normal LV function, moderate asymmetric septal hypertrophy, grade 1 diastolic dysfunction, mild left atrial enlargement.  Since I last saw him, the patient denies any dyspnea on exertion, orthopnea, PND, pedal edema, palpitations, syncope or chest pain.   Current Outpatient Medications  Medication Sig Dispense Refill   acetaminophen (TYLENOL) 650 MG CR tablet Take 650 mg by mouth every 8 (eight) hours as needed for pain.     allopurinol (ZYLOPRIM) 100 MG tablet TAKE 1 TABLET BY MOUTH TWICE  DAILY 180 tablet 1   AMBULATORY NON FORMULARY MEDICATION Continuous positive airway pressure (CPAP) machine set on AutoPAP (4-20 cmH2O), with all supplemental supplies as needed. 1 each 0   amLODipine (NORVASC) 5 MG tablet TAKE 1 TABLET BY MOUTH DAILY 90 tablet 3   apixaban (ELIQUIS) 5 MG TABS tablet TAKE 1 TABLET BY MOUTH TWICE  DAILY 180 tablet 1   atorvastatin (LIPITOR) 80 MG tablet TAKE 1 TABLET BY MOUTH DAILY 90 tablet 3   Blood Glucose Monitoring Suppl DEVI 1 each by Does not apply route 2 (two) times daily as needed. May substitute to any manufacturer covered by patient's insurance. 1 each 0   busPIRone (BUSPAR) 5 MG tablet Take 1-3 tablets (5-15 mg total) by mouth 2 (two) times daily as needed. 60 tablet 0   DULoxetine (CYMBALTA) 30 MG capsule Take 1 capsule (30 mg total) by mouth daily. 30 capsule 1   Glucose Blood (BLOOD GLUCOSE TEST STRIPS) STRP 1 each by In Vitro route 2 (two) times daily as needed. May substitute to any manufacturer  covered by patient's insurance. 100 strip 11   Lancet Device MISC 1 each by Does not apply route 2 (two) times daily as needed. May substitute to any manufacturer covered by patient's insurance. 1 each 0   Lancets Misc. MISC 1 each by Does not apply route 2 (two) times daily as needed. May substitute to any manufacturer covered by patient's insurance. 100 each 11   metoprolol succinate (TOPROL-XL) 100 MG 24 hr tablet Take 1 tablet (100 mg total) by mouth every evening. Take with or immediately following a meal. 90 tablet 3   pregabalin (LYRICA) 150 MG capsule Take 1 capsule (150 mg total) by mouth 3 (three) times daily. 90 capsule 1   Semaglutide, 1 MG/DOSE, (OZEMPIC, 1 MG/DOSE,) 4 MG/3ML SOPN INJECT 1 MG IN THE SKIN ONCE A WEEK 3 mL 1   tadalafil (CIALIS) 5 MG tablet Take 1 tablet (5 mg total) by mouth daily as needed for erectile dysfunction. 30 tablet 11   Tirbanibulin (KLISYRI, 250 MG,) 1 % OINT Apply 1 packet to face and spots on arm bid for 2 weeks 1 each 3   triamcinolone (KENALOG) 0.025 % ointment Apply 1 Application topically 2 (two) times daily. Apply to face x 7 days 30 g 0   No current facility-administered medications for this visit.     Past Medical History:  Diagnosis Date   Atrial fibrillation (HCC)    Basal cell carcinoma 04/15/2023   superior sternum - nedds excision   BCC (basal cell carcinoma of skin)  04/15/2023   Right neck - MOHs needed   Cardiomyopathy    hypertrophic   Gout    HYPERLIPIDEMIA 07/02/2009   Qualifier: Diagnosis of  By: Jens Som, MD, Lyn Hollingshead    Hypertension    Hypertrophic obstructive cardiomyopathy (HCC) 07/03/2009   Qualifier: Diagnosis of  By: Jens Som, MD, Lyn Hollingshead    Lower extremity edema 11/15/2013   Palpitations 09/18/2009   Qualifier: Diagnosis of  By: Jens Som, MD, Lyn Hollingshead    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   Left upper back - ED&C needed     Squamous cell carcinoma of skin  04/15/2023   Left upper back - in situ - needs EDC   SYNCOPE 07/02/2009   Qualifier: Diagnosis of  By: Jens Som, MD, Lyn Hollingshead     Past Surgical History:  Procedure Laterality Date   ATRIAL FIBRILLATION ABLATION N/A 11/11/2021   Procedure: ATRIAL FIBRILLATION ABLATION;  Surgeon: Lanier Prude, MD;  Location: [facility] INVASIVE CV LAB;  Service: Cardiovascular;  Laterality: N/A;   CARDIOVERSION N/A 05/08/2021   Procedure: CARDIOVERSION;  Surgeon: Chilton Si, MD;  Location: [facility] ENDOSCOPY;  Service: Cardiovascular;  Laterality: N/A;   CERVICAL DISC SURGERY     COLONOSCOPY  09/09/2018   ICD IMPLANT N/A 08/05/2021   Procedure: ICD IMPLANT;  Surgeon: Lanier Prude, MD;  Location: [facility] INVASIVE CV LAB;  Service: Cardiovascular;  Laterality: N/A;   TONSILLECTOMY      Social History   Socioeconomic History   Marital status: Married    Spouse name: Not on file   Number of children: 1   Years of education: Not on file   Highest education level: Not on file  Occupational History   Not on file  Tobacco Use   Smoking status: Never   Smokeless tobacco: Never   Tobacco comments:    Never smoke (12/24/2020)  Vaping Use   Vaping status: Never Used  Substance and Sexual Activity   Alcohol use: Yes    Alcohol/week: 2.0 standard drinks of alcohol    Types: 2 Cans of beer per week    Comment: 2 beers once a week 04/24/2021   Drug use: Never   Sexual activity: Yes  Other Topics Concern   Not on file  Social History Narrative   Full time.    Social Drivers of Corporate investment banker Strain: Not on file  Food Insecurity: Not on file  Transportation Needs: Not on file  Physical Activity: Not on file  Stress: Not on file  Social Connections: Unknown (08/15/2021)   Received from [facility], [facility]   Social Network    Social Network: Not on file  Intimate Partner Violence: Unknown (07/08/2021)   Received from [facility]   HITS    Physically  Hurt: Not on file    Insult or Talk Down To: Not on file    Threaten Physical Harm: Not on file    Scream or Curse: Not on file    Family History  Problem Relation Age of Onset   Hypertension Mother    Heart failure Mother    Heart attack Father    Hypertension Sister    Coronary artery disease Other    Diabetes Other        siblings   Colon cancer Neg Hx    Rectal cancer Neg Hx    Stomach cancer Neg Hx    Esophageal cancer Neg Hx  Pancreatic cancer Neg Hx    Liver cancer Neg Hx     ROS: no fevers or chills, productive cough, hemoptysis, dysphasia, odynophagia, melena, hematochezia, dysuria, hematuria, rash, seizure activity, orthopnea, PND, pedal edema, claudication. Remaining systems are negative.  Physical Exam: Well-developed well-nourished in no acute distress.  Skin is warm and dry.  HEENT is normal.  Neck is supple.  Chest is clear to auscultation with normal expansion.  Cardiovascular exam is regular rate and rhythm.  Abdominal exam nontender or distended. No masses palpated. Extremities show no edema. neuro grossly intact  A/P  1 paroxysmal atrial fibrillation-patient remains in sinus rhythm status post ablation.  Continue beta-blocker and apixaban at present dose. Check hgb and renal function.   2 hypertrophic cardiomyopathy-continue beta-blocker.  We again discussed genetic testing today and he is agreeabe; will arrange.  3 hypertension-blood pressure controlled.  Continue present medications.  4 hyperlipidemia-continue statin. Check lipids and liver.  5 coronary calcification-continue statin.  He is not on aspirin given need for anticoagulation.  6 palpitations-continue beta-blocker.  7 ICD-followed by electrophysiology.  Olga Millers, MD

## 2023-05-31 NOTE — Telephone Encounter (Signed)
 R/S fdue to snow. NOV below.   PACI, MD 06/02/2023 at 9:30 AM

## 2023-06-02 ENCOUNTER — Encounter: Payer: PRIVATE HEALTH INSURANCE | Admitting: Dermatology

## 2023-06-09 ENCOUNTER — Encounter: Payer: Self-pay | Admitting: Cardiology

## 2023-06-09 ENCOUNTER — Ambulatory Visit: Payer: PRIVATE HEALTH INSURANCE | Attending: Cardiology | Admitting: Cardiology

## 2023-06-09 VITALS — BP 122/80 | HR 77 | Ht 70.0 in | Wt 219.0 lb

## 2023-06-09 DIAGNOSIS — I2510 Atherosclerotic heart disease of native coronary artery without angina pectoris: Secondary | ICD-10-CM

## 2023-06-09 DIAGNOSIS — I421 Obstructive hypertrophic cardiomyopathy: Secondary | ICD-10-CM

## 2023-06-09 DIAGNOSIS — I10 Essential (primary) hypertension: Secondary | ICD-10-CM | POA: Diagnosis not present

## 2023-06-09 DIAGNOSIS — I480 Paroxysmal atrial fibrillation: Secondary | ICD-10-CM

## 2023-06-09 DIAGNOSIS — Z95810 Presence of automatic (implantable) cardiac defibrillator: Secondary | ICD-10-CM

## 2023-06-09 DIAGNOSIS — E7800 Pure hypercholesterolemia, unspecified: Secondary | ICD-10-CM

## 2023-06-09 NOTE — Patient Instructions (Signed)
    Lab Work:  Your physician recommends that you return for lab work FASTING WHEN CONVENIENT  If you have labs (blood work) drawn today and your tests are completely normal, you will receive your results only by: MyChart Message (if you have MyChart) OR A paper copy in the mail If you have any lab test that is abnormal or we need to change your treatment, we will call you to review the results.   Follow-Up: At [facility], you and your health needs are our priority.  As part of our continuing mission to provide you with exceptional heart care, we have created designated Provider Care Teams.  These Care Teams include your primary Cardiologist (physician) and Advanced Practice Providers (APPs -  Physician Assistants and Nurse Practitioners) who all work together to provide you with the care you need, when you need it.   Your next appointment:   12 month(s)  Provider:   Olga Millers, MD

## 2023-06-09 NOTE — Telephone Encounter (Signed)
 06/22/2023 at 9:30 AM - R neck

## 2023-06-11 NOTE — Progress Notes (Signed)
 Remote ICD transmission.

## 2023-06-12 LAB — COMPREHENSIVE METABOLIC PANEL
ALT: 29 IU/L (ref 0–44)
AST: 25 IU/L (ref 0–40)
Albumin: 4.6 g/dL (ref 3.9–4.9)
Alkaline Phosphatase: 87 IU/L (ref 44–121)
BUN/Creatinine Ratio: 17 (ref 10–24)
BUN: 19 mg/dL (ref 8–27)
Bilirubin Total: 1.4 mg/dL — ABNORMAL HIGH (ref 0.0–1.2)
CO2: 23 mmol/L (ref 20–29)
Calcium: 9.7 mg/dL (ref 8.6–10.2)
Chloride: 102 mmol/L (ref 96–106)
Creatinine, Ser: 1.11 mg/dL (ref 0.76–1.27)
Globulin, Total: 2.5 g/dL (ref 1.5–4.5)
Glucose: 98 mg/dL (ref 70–99)
Potassium: 5.4 mmol/L — ABNORMAL HIGH (ref 3.5–5.2)
Sodium: 138 mmol/L (ref 134–144)
Total Protein: 7.1 g/dL (ref 6.0–8.5)
eGFR: 74 mL/min/{1.73_m2} (ref >59)

## 2023-06-12 LAB — CBC
Hematocrit: 47.3 % (ref 37.5–51.0)
Hemoglobin: 16.4 g/dL (ref 13.0–17.7)
MCH: 31.5 pg (ref 26.6–33.0)
MCHC: 34.7 g/dL (ref 31.5–35.7)
MCV: 91 fL (ref 79–97)
Platelets: 290 10*3/uL (ref 150–450)
RBC: 5.21 x10E6/uL (ref 4.14–5.80)
RDW: 12 % (ref 11.6–15.4)
WBC: 8.2 10*3/uL (ref 3.4–10.8)

## 2023-06-12 LAB — LIPID PANEL
Chol/HDL Ratio: 2.3 ratio (ref 0.0–5.0)
Cholesterol, Total: 106 mg/dL (ref 100–199)
HDL: 46 mg/dL (ref >39)
LDL Chol Calc (NIH): 45 mg/dL (ref 0–99)
Triglycerides: 73 mg/dL (ref 0–149)
VLDL Cholesterol Cal: 15 mg/dL (ref 5–40)

## 2023-06-14 ENCOUNTER — Encounter: Payer: Self-pay | Admitting: *Deleted

## 2023-06-14 ENCOUNTER — Other Ambulatory Visit: Payer: Self-pay | Admitting: *Deleted

## 2023-06-14 DIAGNOSIS — R17 Unspecified jaundice: Secondary | ICD-10-CM

## 2023-06-16 ENCOUNTER — Other Ambulatory Visit: Payer: Self-pay | Admitting: Medical-Surgical

## 2023-06-21 ENCOUNTER — Encounter: Payer: Self-pay | Admitting: *Deleted

## 2023-06-22 ENCOUNTER — Encounter: Payer: Self-pay | Admitting: Dermatology

## 2023-06-22 ENCOUNTER — Ambulatory Visit (INDEPENDENT_AMBULATORY_CARE_PROVIDER_SITE_OTHER): Payer: PRIVATE HEALTH INSURANCE | Admitting: Dermatology

## 2023-06-22 VITALS — BP 139/89 | HR 70 | Temp 98.2°F

## 2023-06-22 DIAGNOSIS — C4441 Basal cell carcinoma of skin of scalp and neck: Secondary | ICD-10-CM | POA: Diagnosis not present

## 2023-06-22 DIAGNOSIS — L579 Skin changes due to chronic exposure to nonionizing radiation, unspecified: Secondary | ICD-10-CM | POA: Diagnosis not present

## 2023-06-22 DIAGNOSIS — L814 Other melanin hyperpigmentation: Secondary | ICD-10-CM | POA: Diagnosis not present

## 2023-06-22 DIAGNOSIS — L905 Scar conditions and fibrosis of skin: Secondary | ICD-10-CM | POA: Diagnosis not present

## 2023-06-22 DIAGNOSIS — Z85828 Personal history of other malignant neoplasm of skin: Secondary | ICD-10-CM

## 2023-06-22 DIAGNOSIS — C4491 Basal cell carcinoma of skin, unspecified: Secondary | ICD-10-CM

## 2023-06-22 NOTE — Patient Instructions (Signed)

## 2023-06-22 NOTE — Progress Notes (Signed)
 Follow-Up Visit   Subjective  Dale Rivera is a 63 y.o. male who presents for the following: Mohs of a nodular Basal Cell Carcinoma on the right neck, referred by Dr. Onalee Hua.  The following portions of the chart were reviewed this encounter and updated as appropriate: medications, allergies, medical history  Review of Systems:  No other skin or systemic complaints except as noted in HPI or Assessment and Plan.  Objective  Well appearing patient in no apparent distress; mood and affect are within normal limits.  A focused examination was performed of the following areas: Right neck Relevant physical exam findings are noted in the Assessment and Plan.     Assessment & Plan   BASAL CELL CARCINOMA (BCC), UNSPECIFIED SITE Right Neck Mohs surgery  Consent obtained: written  Anticoagulation: Was the anticoagulation regimen changed prior to Mohs? No    Procedure Details: Timeout: pre-procedure verification complete Procedure Prep: patient was prepped and draped in usual sterile fashion Prep type: chlorhexidine Pre-Op diagnosis: basal cell carcinoma BCC subtype: nodular MohsAIQ Surgical site (if tumor spans multiple areas, please select predominant area): neck Surgical site (from skin exam): Right Neck Pre-operative length (cm): 1 Pre-operative width (cm): 0.5 Indications for Mohs surgery: anatomic location where tissue conservation is critical  Micrographic Surgery Details: Post-operative length (cm): 1.8 Post-operative width (cm): 1.2 Number of Mohs stages: 1  Skin repair Complexity:  Complex Final length (cm):  3.8 Timeout: patient name, date of birth, surgical site, and procedure verified   Procedure prep:  Patient was prepped and draped in usual sterile fashion Prep type:  Chlorhexidine Anesthesia: the lesion was anesthetized in a standard fashion   Anesthetic:  1% lidocaine w/ epinephrine 1-100,000 buffered w/ 8.4% NaHCO3 Reason for type of repair: reduce tension  to allow closure, preserve normal anatomical and functional relationships, avoid adjacent structures and allow side-to-side closure without requiring a flap or graft   Undermining: area extensively undermined   Subcutaneous layers (deep stitches):  Suture size:  5-0 Suture type: Vicryl (polyglactin 910)   Stitches:  Buried vertical mattress Fine/surface layer approximation (top stitches):  Suture size:  6-0 Suture type: fast-absorbing plain gut   Stitches: simple running   Hemostasis achieved with: suture, pressure and electrodesiccation Outcome: patient tolerated procedure well with no complications   Post-procedure details: sterile dressing applied and wound care instructions given   SCAR    Scar s/p WLE for nodular BCC, treated on 05/11/2023, repaired with linear closure on chest - Reassured that wound has healed well - Discussed that scars take up to 12 months to mature from the date of surgery - Recommend SPF 30+ to scar daily to prevent purple color - OK to start scar massage at 4-6 weeks post-op - Can consider silicone based products for scar healing  Return in about 4 weeks (around 07/20/2023) for wound check.  Owens Shark, CMA, am acting as scribe for Gwenith Daily, MD.    06/22/2023  HISTORY OF PRESENT ILLNESS  Dale Rivera is seen in consultation at the request of Dr. Onalee Hua for biopsy-proven Nodular Basal Cell Carcinoma of the right neck. They note that the area has been present for about 6 months increasing in size with time.  There is no history of previous treatment.  Reports no other new or changing lesions and has no other complaints today.  Medications and allergies: see patient chart.  Review of systems: Reviewed 8 systems and notable for the above skin cancer.  All other systems reviewed  are unremarkable/negative, unless noted in the HPI. Past medical history, surgical history, family history, social history were also reviewed and are noted in the  chart/questionnaire.    PHYSICAL EXAMINATION  General: Well-appearing, in no acute distress, alert and oriented x 4. Vitals reviewed in chart (if available).   Skin: Exam reveals a 1.0 x 0.5 cm erythematous papule and biopsy scar on the right neck. There are rhytids, telangiectasias, and lentigines, consistent with photodamage.   Biopsy report(s) reviewed, confirming the diagnosis.   ASSESSMENT  1) Nodular Basal Cell Carcinoma of the right neck 2) photodamage 3) solar lentigines   PLAN   1. Due to location, size, histology, or recurrence and the likelihood of subclinical extension as well as the need to conserve normal surrounding tissue, the patient was deemed acceptable for Mohs micrographic surgery (MMS).  The nature and purpose of the procedure, associated benefits and risks including recurrence and scarring, possible complications such as pain, infection, and bleeding, and alternative methods of treatment if appropriate were discussed with the patient during consent. The lesion location was verified by the patient, by reviewing previous notes, pathology reports, and by photographs as well as angulation measurements if available.  Informed consent was reviewed and signed by the patient, and timeout was performed at 9:30 AM. See op note below.  2. For the photodamage and solar lentigines, sun protection discussed/information given on OTC sunscreens, and we recommend continued regular follow-up with primary dermatologist every 6 months or sooner for any growing, bleeding, or changing lesions. 3. Prognosis and future surveillance discussed. 4. Letter with treatment outcome sent to referring provider. 5. Pain acetaminophen   MOHS MICROGRAPHIC SURGERY AND RECONSTRUCTION  Initial size:   1.0 x 0.5 cm Surgical defect/wound size: 1.2 x 1.8 cm Anesthesia:    0.33% lidocaine with 1:200,000 epinephrine EBL:    <5 mL Complications:  None Repair type:   Complex SQ suture:   5-0  Vicryl Cutaneous suture:  6-0 Plain gut Final size of the repair: 3.8 cm  Stages: 1  STAGE I: Anesthesia achieved with 0.5% lidocaine with 1:200,000 epinephrine. ChloraPrep applied. 1 section(s) excised using Mohs technique (this includes total peripheral and deep tissue margin excision and evaluation with frozen sections, excised and interpreted by the same physician). The tumor was first debulked and then excised with an approx. 2mm margin.  Hemostasis was achieved with electrocautery as needed.  The specimen was then oriented, subdivided/relaxed, inked, and processed using Mohs technique.    Frozen section analysis revealed a clear deep and peripheral margin.  Reconstruction  The surgical wound was then cleaned, prepped, and re-anesthetized as above. Wound edges were undermined extensively along at least one entire edge and at a distance equal to or greater than the width of the defect (see wound defect size above) in order to achieve closure and decrease wound tension and anatomic distortion. Redundant tissue repair including standing cone removal was performed. Hemostasis was achieved with electrocautery. Subcutaneous and epidermal tissues were approximated with the above sutures. The surgical site was then lightly scrubbed with sterile, saline-soaked gauze. The area was then bandaged using Vaseline ointment, non-adherent gauze, gauze pads, and tape to provide an adequate pressure dressing. The patient tolerated the procedure well, was given detailed written and verbal wound care instructions, and was discharged in good condition.   The patient will follow-up: 4 weeks.   Documentation: I have reviewed the above documentation for accuracy and completeness, and I agree with the above.  Gwenith Daily, MD

## 2023-06-23 ENCOUNTER — Ambulatory Visit (INDEPENDENT_AMBULATORY_CARE_PROVIDER_SITE_OTHER): Payer: PRIVATE HEALTH INSURANCE | Admitting: Medical-Surgical

## 2023-06-23 ENCOUNTER — Encounter: Payer: Self-pay | Admitting: Medical-Surgical

## 2023-06-23 VITALS — BP 110/67 | HR 66 | Resp 20 | Ht 70.0 in | Wt 219.5 lb

## 2023-06-23 DIAGNOSIS — E7800 Pure hypercholesterolemia, unspecified: Secondary | ICD-10-CM | POA: Diagnosis not present

## 2023-06-23 DIAGNOSIS — Z23 Encounter for immunization: Secondary | ICD-10-CM

## 2023-06-23 DIAGNOSIS — I4819 Other persistent atrial fibrillation: Secondary | ICD-10-CM | POA: Diagnosis not present

## 2023-06-23 DIAGNOSIS — E349 Endocrine disorder, unspecified: Secondary | ICD-10-CM

## 2023-06-23 DIAGNOSIS — E119 Type 2 diabetes mellitus without complications: Secondary | ICD-10-CM | POA: Diagnosis not present

## 2023-06-23 DIAGNOSIS — F419 Anxiety disorder, unspecified: Secondary | ICD-10-CM | POA: Diagnosis not present

## 2023-06-23 DIAGNOSIS — M1A9XX0 Chronic gout, unspecified, without tophus (tophi): Secondary | ICD-10-CM

## 2023-06-23 DIAGNOSIS — G479 Sleep disorder, unspecified: Secondary | ICD-10-CM

## 2023-06-23 LAB — POCT GLYCOSYLATED HEMOGLOBIN (HGB A1C)
HbA1c, POC (controlled diabetic range): 5.9 % (ref 0.0–7.0)
Hemoglobin A1C: 5.9 % — AB (ref 4.0–5.6)

## 2023-06-23 MED ORDER — PREGABALIN 150 MG PO CAPS: 150.0000 mg | ORAL_CAPSULE | Freq: Three times a day (TID) | ORAL | 5 refills | Status: DC

## 2023-06-23 MED ORDER — ALLOPURINOL 100 MG PO TABS
100.0000 mg | ORAL_TABLET | Freq: Two times a day (BID) | ORAL | 1 refills | Status: DC
Start: 2023-06-23 — End: 2023-11-19

## 2023-06-23 MED ORDER — SEMAGLUTIDE (2 MG/DOSE) 8 MG/3ML ~~LOC~~ SOPN: 2.0000 mg | PEN_INJECTOR | SUBCUTANEOUS | 1 refills | Status: DC

## 2023-06-23 NOTE — Progress Notes (Signed)
 Medical screening examination/treatment was performed by qualified clinical staff member and as supervising provider I was immediately available for consultation/collaboration. I have reviewed documentation and agree with assessment and plan.  Thayer Ohm, DNP, APRN, FNP-BC [facility]

## 2023-06-23 NOTE — Progress Notes (Signed)
 Subjective:  Patient ID: Dale Rivera, male    DOB: 11-Sep-1960, 63 y.o.   MRN: [id number]  Patient Care Team: Christen Butter, NP as PCP - General (Nurse Practitioner) Lewayne Bunting, MD as PCP - Cardiology (Cardiology) Lanier Prude, MD as PCP - Electrophysiology (Cardiology) [facility] (Optometry)   Chief Complaint:  Diabetes (MAINTENANCE)   HPI:  Dale Rivera is a 63 y.o. male presenting on 06/23/2023 for Diabetes (MAINTENANCE)   History, Exam,  Impression and Plan  1. Type 2 diabetes mellitus without complication, without long-term current use of insulin (HCC) (Primary) Patient present for follow-up of diabetes type 2.  Reports a lot of walking when playing golf and is working out in the gym with cardio and weights 4 times weekly.  Admits to ongoing struggle with healthy dietary choices.  Taking Ozempic 1 mg SQ weekly without complications or GI side effects.  Hemoglobin A1c was 5.9 today up from 5.7 last visit. Worried about increasing hemoglobin A1c and also wanting to lose more weight. Would like to increase his dose of Ozempic.  Will increase semaglutide to 2 mg SQ weekly. -   POCT HgB A1C -   Semaglutide, 2 MG/DOSE, 8 MG/3ML SOPN; Inject 2 mg as directed once a week.  2. Pure hypercholesterolemia Patient very active with cardio and weights in gym 4 times weekly.  Patient also walks a lot when playing golf.  Denies any diet or dietary restrictions but continues to work on healthy dietary choices.   3. Chronic gout involving toe of right foot without tophus, unspecified cause Patient taking allopurinol and Lyrica as directed denies any side effects.  Denies any right foot or toe pain.  Denies any gout signs and symptoms.  Patient expresses satisfaction with not having foot pain notes no pain.  Continue current medication regimen with allopurinol and Lyrica. - Refill and continue allopurinol (ZYLOPRIM) 100 MG tablet; Take 1 tablet (100 mg total) by mouth 2 (two)  times daily.  Dispense: 180 tablet; Refill: 1 -  pregabalin (LYRICA) 150 MG capsule; Take 1 capsule (150 mg total) by mouth 3 (three) times daily.  4. Testosterone deficiency Last testosterone level on 09/30/2021 was 375.  And on 09/26/2020 was a 525.  Patient taking tadalafil 5 mg PO PRN for ED.  Satisfied with tadalafil and is not interested in making changes to medication or treatment regimen at this point.  5. Trouble in sleeping Patient has CPAP machine at home with nose mask/pillows but uses irregularly  because of travel [place] and forth between [place] home and work in [place].  Recommend to patient  restart and continue CPAP to improve sleep and cardiovascular health.  6. Anxiety Patient taking BuSpar buspirone 5 mg PO PRN as directed.  Uses infrequently big meetings and for stage fright/anxiety presenting to large crowds. Reports that it works well and wishes to not change regimen. Continue as needed   7. Persistent atrial fibrillation (HCC) History of atrial fibrillation being managed by cardiologist Dr. Jens Som.  Implanted defibrillator and pacemaker to left upper chest. Denies CP, SOB, palpitations, lower extremity edema, dizziness, headaches, or vision changes.  Cardiology managing and  patient's hypertensive medications.  8. Need for tetanus booster Interested and consented to receive. - Tdap vaccine greater than or equal to 7yo IM  Continue all other maintenance medications.  Follow up plan: Return in about 6 months (around 12/24/2023) for DM/gout follow up.   Relevant past medical, surgical, family, and social history  reviewed and updated as indicated.  Allergies and medications reviewed and updated. Data reviewed: Chart in Epic.   Past Medical History:  Diagnosis Date   Atrial fibrillation (HCC)    Basal cell carcinoma 04/15/2023   superior sternum - nedds excision   BCC (basal cell carcinoma of skin) 04/15/2023   Right neck - MOHs needed   Cardiomyopathy     hypertrophic   Gout    HYPERLIPIDEMIA 07/02/2009   Qualifier: Diagnosis of  By: Jens Som, MD, Lyn Hollingshead    Hypertension    Hypertrophic obstructive cardiomyopathy (HCC) 07/03/2009   Qualifier: Diagnosis of  By: Jens Som, MD, Lyn Hollingshead    Lower extremity edema 11/15/2013   Palpitations 09/18/2009   Qualifier: Diagnosis of  By: Jens Som, MD, Lyn Hollingshead    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   Left upper back - ED&C needed     Squamous cell carcinoma of skin 04/15/2023   Left upper back - in situ - needs EDC   SYNCOPE 07/02/2009   Qualifier: Diagnosis of  By: Jens Som, MD, Lyn Hollingshead     Past Surgical History:  Procedure Laterality Date   ATRIAL FIBRILLATION ABLATION N/A 11/11/2021   Procedure: ATRIAL FIBRILLATION ABLATION;  Surgeon: Lanier Prude, MD;  Location: [facility] INVASIVE CV LAB;  Service: Cardiovascular;  Laterality: N/A;   CARDIOVERSION N/A 05/08/2021   Procedure: CARDIOVERSION;  Surgeon: Chilton Si, MD;  Location: [facility] ENDOSCOPY;  Service: Cardiovascular;  Laterality: N/A;   CERVICAL DISC SURGERY     COLONOSCOPY  09/09/2018   ICD IMPLANT N/A 08/05/2021   Procedure: ICD IMPLANT;  Surgeon: Lanier Prude, MD;  Location: [facility] INVASIVE CV LAB;  Service: Cardiovascular;  Laterality: N/A;   TONSILLECTOMY      Social History   Socioeconomic History   Marital status: Married    Spouse name: Not on file   Number of children: 1   Years of education: Not on file   Highest education level: Bachelor's degree (e.g., BA, AB, BS)  Occupational History   Not on file  Tobacco Use   Smoking status: Never   Smokeless tobacco: Never   Tobacco comments:    Never smoke (12/24/2020)  Vaping Use   Vaping status: Never Used  Substance and Sexual Activity   Alcohol use: Yes    Alcohol/week: 2.0 standard drinks of alcohol    Types: 2 Cans of beer per week    Comment: 2 beers once a week 04/24/2021   Drug use: Never   Sexual  activity: Yes  Other Topics Concern   Not on file  Social History Narrative   Full time.    Social Drivers of Corporate investment banker Strain: Low Risk  (06/20/2023)   Overall Financial Resource Strain (CARDIA)    Difficulty of Paying Living Expenses: Not hard at all  Food Insecurity: No Food Insecurity (06/20/2023)   Hunger Vital Sign    Worried About Running Out of Food in the Last Year: Never true    Ran Out of Food in the Last Year: Never true  Transportation Needs: No Transportation Needs (06/20/2023)   PRAPARE - Administrator, Civil Service (Medical): No    Lack of Transportation (Non-Medical): No  Physical Activity: Sufficiently Active (06/20/2023)   Exercise Vital Sign    Days of Exercise per Week: 4 days    Minutes of Exercise per Session: 60 min  Stress: Stress Concern Present (06/20/2023)   [employer]  of Occupational Health - Occupational Stress Questionnaire    Feeling of Stress : To some extent  Social Connections: Socially Integrated (06/20/2023)   Social Connection and Isolation Panel [NHANES]    Frequency of Communication with Friends and Family: More than three times a week    Frequency of Social Gatherings with Friends and Family: Twice a week    Attends Religious Services: More than 4 times per year    Active Member of [employer] or Organizations: Yes    Attends Engineer, structural: More than 4 times per year    Marital Status: Married  Catering manager Violence: Unknown (07/08/2021)   Received from [employer], [facility]   HITS    Physically Hurt: Not on file    Insult or Talk Down To: Not on file    Threaten Physical Harm: Not on file    Scream or Curse: Not on file    Outpatient Encounter Medications as of 06/23/2023  Medication Sig   acetaminophen (TYLENOL) 650 MG CR tablet Take 650 mg by mouth every 8 (eight) hours as needed for pain.   AMBULATORY NON FORMULARY MEDICATION Continuous positive airway pressure (CPAP) machine  set on AutoPAP (4-20 cmH2O), with all supplemental supplies as needed.   amLODipine (NORVASC) 5 MG tablet TAKE 1 TABLET BY MOUTH DAILY   apixaban (ELIQUIS) 5 MG TABS tablet TAKE 1 TABLET BY MOUTH TWICE  DAILY   atorvastatin (LIPITOR) 80 MG tablet TAKE 1 TABLET BY MOUTH DAILY   Blood Glucose Monitoring Suppl DEVI 1 each by Does not apply route 2 (two) times daily as needed. May substitute to any manufacturer covered by patient's insurance.   busPIRone (BUSPAR) 5 MG tablet Take 1-3 tablets (5-15 mg total) by mouth 2 (two) times daily as needed.   DULoxetine (CYMBALTA) 30 MG capsule Take 1 capsule (30 mg total) by mouth daily.   Glucose Blood (BLOOD GLUCOSE TEST STRIPS) STRP 1 each by In Vitro route 2 (two) times daily as needed. May substitute to any manufacturer covered by patient's insurance.   Lancet Device MISC 1 each by Does not apply route 2 (two) times daily as needed. May substitute to any manufacturer covered by patient's insurance.   Lancets Misc. MISC 1 each by Does not apply route 2 (two) times daily as needed. May substitute to any manufacturer covered by patient's insurance.   metoprolol succinate (TOPROL-XL) 100 MG 24 hr tablet Take 1 tablet (100 mg total) by mouth every evening. Take with or immediately following a meal.   Semaglutide, 2 MG/DOSE, 8 MG/3ML SOPN Inject 2 mg as directed once a week.   tadalafil (CIALIS) 5 MG tablet Take 1 tablet (5 mg total) by mouth daily as needed for erectile dysfunction.   Tirbanibulin (KLISYRI, 250 MG,) 1 % OINT Apply 1 packet to face and spots on arm bid for 2 weeks   triamcinolone (KENALOG) 0.025 % ointment Apply 1 Application topically 2 (two) times daily. Apply to face x 7 days   [DISCONTINUED] allopurinol (ZYLOPRIM) 100 MG tablet TAKE 1 TABLET BY MOUTH TWICE  DAILY   [DISCONTINUED] pregabalin (LYRICA) 150 MG capsule Take 1 capsule (150 mg total) by mouth 3 (three) times daily.   [DISCONTINUED] Semaglutide, 1 MG/DOSE, (OZEMPIC, 1 MG/DOSE,) 4  MG/3ML SOPN INJECT 1 MG IN THE SKIN ONCE A WEEK   allopurinol (ZYLOPRIM) 100 MG tablet Take 1 tablet (100 mg total) by mouth 2 (two) times daily.   pregabalin (LYRICA) 150 MG capsule Take 1 capsule (  150 mg total) by mouth 3 (three) times daily.   No facility-administered encounter medications on file as of 06/23/2023.    No Known Allergies  Review of Systems      Objective:  BP 110/67 (BP Location: Left Arm, Cuff Size: Normal)   Pulse 66   Resp 20   Ht 5\' 10"  (1.778 m)   Wt 219 lb 8 oz (99.6 kg)   SpO2 95%   BMI 31.49 kg/m    Wt Readings from Last 3 Encounters:  06/23/23 219 lb 8 oz (99.6 kg)  06/09/23 219 lb (99.3 kg)  04/27/23 220 lb 6.4 oz (100 kg)    Physical Exam Musculoskeletal:       Feet:  Feet:     Right foot:     Protective Sensation: 10 sites tested.  10 sites sensed.     Left foot:     Protective Sensation: 10 sites tested.  9 sites sensed.     Comments: Fillament test: Right foot: 10/10. Left foot 9/10. Unfelt on plantar aspect of foot proximal to small toe     Results for orders placed or performed in visit on 06/09/23  CBC   Collection Time: 06/11/23 11:09 AM  Result Value Ref Range   WBC 8.2 3.4 - 10.8 x10E3/uL   RBC 5.21 4.14 - 5.80 x10E6/uL   Hemoglobin 16.4 13.0 - 17.7 g/dL   Hematocrit 16.1 09.6 - 51.0 %   MCV 91 79 - 97 fL   MCH 31.5 26.6 - 33.0 pg   MCHC 34.7 31.5 - 35.7 g/dL   RDW 04.5 40.9 - 81.1 %   Platelets 290 150 - 450 x10E3/uL  Comprehensive Metabolic Panel (CMET)   Collection Time: 06/11/23 11:09 AM  Result Value Ref Range   Glucose 98 70 - 99 mg/dL   BUN 19 8 - 27 mg/dL   Creatinine, Ser 9.14 0.76 - 1.27 mg/dL   eGFR 74 >78 GN/FAO/1.30   BUN/Creatinine Ratio 17 10 - 24   Sodium 138 134 - 144 mmol/L   Potassium 5.4 (H) 3.5 - 5.2 mmol/L   Chloride 102 96 - 106 mmol/L   CO2 23 20 - 29 mmol/L   Calcium 9.7 8.6 - 10.2 mg/dL   Total Protein 7.1 6.0 - 8.5 g/dL   Albumin 4.6 3.9 - 4.9 g/dL   Globulin, Total 2.5 1.5 - 4.5  g/dL   Bilirubin Total 1.4 (H) 0.0 - 1.2 mg/dL   Alkaline Phosphatase 87 44 - 121 IU/L   AST 25 0 - 40 IU/L   ALT 29 0 - 44 IU/L  Lipid panel   Collection Time: 06/11/23 11:09 AM  Result Value Ref Range   Cholesterol, Total 106 100 - 199 mg/dL   Triglycerides 73 0 - 149 mg/dL   HDL 46 >86 mg/dL   VLDL Cholesterol Cal 15 5 - 40 mg/dL   LDL Chol Calc (NIH) 45 0 - 99 mg/dL   Chol/HDL Ratio 2.3 0.0 - 5.0 ratio       Pertinent labs & imaging results that were available during my care of the patient were reviewed by me and considered in my medical decision making.   Continue healthy lifestyle choices, including diet (rich in fruits, vegetables, and lean proteins, and low in salt and simple carbohydrates) and exercise (at least 30 minutes of moderate physical activity daily).  The above assessment and management plan was discussed with the patient. The patient verbalized understanding of and has agreed to the management  plan. Patient is aware to call the clinic if they develop any new symptoms or if symptoms persist or worsen. Patient is aware when to return to the clinic for a follow-up visit. Patient educated on when it is appropriate to go to the emergency department.   Maryelizabeth Kaufmann Student AGNP

## 2023-06-29 ENCOUNTER — Other Ambulatory Visit (HOSPITAL_COMMUNITY): Payer: Self-pay

## 2023-06-29 ENCOUNTER — Telehealth: Payer: Self-pay

## 2023-06-29 NOTE — Telephone Encounter (Signed)
 Pharmacy Patient Advocate Encounter   Received notification from Onbase that prior authorization for Ozempic (0.25 or 0.5 MG/DOSE) 2MG /3ML pen-injectors is required/requested.   Insurance verification completed.   The patient is insured through [facility] .   Per test claim: PA required; PA submitted to above mentioned insurance via CoverMyMeds Key/confirmation #/EOC  [facility] Status is pending

## 2023-06-29 NOTE — Telephone Encounter (Signed)
 Pharmacy Patient Advocate Encounter  Received notification from [facility] that Prior Authorization for Ozempic (0.25 or 0.5 MG/DOSE) 2MG /3ML pen-injectors has been APPROVED from 06/18/23 to 06/28/24. Unable to obtain price due to refill too soon rejection, last fill date 06/18/23 next available fill date04/05/25   PA #/Case ID/Reference #: [id number]

## 2023-07-11 ENCOUNTER — Encounter: Payer: Self-pay | Admitting: Medical-Surgical

## 2023-07-15 ENCOUNTER — Other Ambulatory Visit: Payer: Self-pay

## 2023-07-15 DIAGNOSIS — E119 Type 2 diabetes mellitus without complications: Secondary | ICD-10-CM

## 2023-07-15 MED ORDER — SEMAGLUTIDE (2 MG/DOSE) 8 MG/3ML ~~LOC~~ SOPN: 2.0000 mg | PEN_INJECTOR | SUBCUTANEOUS | 1 refills | Status: DC

## 2023-07-20 ENCOUNTER — Encounter: Payer: Self-pay | Admitting: Dermatology

## 2023-07-20 ENCOUNTER — Ambulatory Visit (INDEPENDENT_AMBULATORY_CARE_PROVIDER_SITE_OTHER): Payer: PRIVATE HEALTH INSURANCE | Admitting: Dermatology

## 2023-07-20 VITALS — BP 138/86

## 2023-07-20 DIAGNOSIS — L905 Scar conditions and fibrosis of skin: Secondary | ICD-10-CM

## 2023-07-20 DIAGNOSIS — Z85828 Personal history of other malignant neoplasm of skin: Secondary | ICD-10-CM

## 2023-07-20 DIAGNOSIS — C4491 Basal cell carcinoma of skin, unspecified: Secondary | ICD-10-CM

## 2023-07-20 NOTE — Patient Instructions (Signed)

## 2023-07-20 NOTE — Progress Notes (Signed)
   Follow-Up Visit   Subjective  Dale Rivera is a 63 y.o. male who presents for the following: BCC follow up of right neck S/P Mohs 06/22/2023, which has healed well.   The following portions of the chart were reviewed this encounter and updated as appropriate: medications, allergies, medical history  Review of Systems:  No other skin or systemic complaints except as noted in HPI or Assessment and Plan.  Objective  Well appearing patient in no apparent distress; mood and affect are within normal limits.  A focused examination was performed of the following areas: Neck  Relevant exam findings are noted in the Assessment and Plan.       Assessment & Plan   Scar s/p Mohs for [facility] on the right neck, treated on 06/22/2023, repaired with linear closure - Reassured that wound has healed well - Discussed that scars take up to 12 months to mature from the date of surgery - Recommend SPF 30+ to scar daily to prevent purple color - OK to start scar massage at 4-6 weeks post-op - Can consider silicone based products for scar healing - Scar handout given  HISTORY OF BASAL CELL CARCINOMA OF RIGHT NECK S/P Mohs 06/22/2023 - No evidence of recurrence today. Recommend scar massage and silicone based scar treatment - info given today. - Recommend regular full body skin exams - Recommend daily broad spectrum sunscreen SPF 30+ to sun-exposed areas, reapply every 2 hours as needed.  - Call if any new or changing lesions are noted between office visits    Return for Follow up as scheduled.  I, Eliot Guernsey, CMA, am acting as scribe for Deneise Finlay, MD .   Documentation: I have reviewed the above documentation for accuracy and completeness, and I agree with the above.  Deneise Finlay, MD

## 2023-08-05 ENCOUNTER — Ambulatory Visit (INDEPENDENT_AMBULATORY_CARE_PROVIDER_SITE_OTHER): Payer: PRIVATE HEALTH INSURANCE

## 2023-08-05 DIAGNOSIS — I421 Obstructive hypertrophic cardiomyopathy: Secondary | ICD-10-CM | POA: Diagnosis not present

## 2023-08-05 LAB — CUP PACEART REMOTE DEVICE CHECK
Date Time Interrogation Session: 20250501081732
Implantable Lead Connection Status: 753985
Implantable Lead Implant Date: 20230502
Implantable Lead Location: 753860
Implantable Lead Model: 436909
Implantable Lead Serial Number: 81487767
Implantable Pulse Generator Implant Date: 20230502
Pulse Gen Model: 429525
Pulse Gen Serial Number: 84895165

## 2023-08-08 ENCOUNTER — Encounter: Payer: Self-pay | Admitting: Cardiology

## 2023-08-12 ENCOUNTER — Encounter: Payer: Self-pay | Admitting: Medical-Surgical

## 2023-08-16 ENCOUNTER — Telehealth: Payer: Self-pay | Admitting: Cardiology

## 2023-08-16 ENCOUNTER — Telehealth: Payer: Self-pay

## 2023-08-16 DIAGNOSIS — I480 Paroxysmal atrial fibrillation: Secondary | ICD-10-CM

## 2023-08-16 MED ORDER — APIXABAN 5 MG PO TABS
5.0000 mg | ORAL_TABLET | Freq: Two times a day (BID) | ORAL | 0 refills | Status: DC
Start: 2023-08-16 — End: 2023-09-06

## 2023-08-16 NOTE — Telephone Encounter (Signed)
 Pt c/o medication issue:  1. Name of Medication: apixaban  (ELIQUIS ) 5 MG TABS tablet  2. How are you currently taking this medication (dosage and times per day)?   TAKE 1 TABLET BY MOUTH TWICE DAILY    3. Are you having a reaction (difficulty breathing--STAT)? No  4. What is your medication issue? Patient stated his insurance was cancelled by his job and is in the midst of getting it straightened out. Patient stated the medication was sent [facility], but he is leaving for [place] on Wednesday. Patient would like to know if we can send a 30 day supply to [facility], [place]. Patient would like to know if we have any coupons available for him to use. Please advise.

## 2023-08-16 NOTE — Telephone Encounter (Signed)
 Copied from CRM [id number]. Topic: Clinical - Medication Question >> Aug 16, 2023 11:36 AM [name] wrote: Reason for CRM: Due to patients insurance being accidentally dropped. Dale Rivera would like to know how long can he be off his medication ( Semaglutide , 2 MG/DOSE, 8 MG/3ML SOPN)   Last dose: May 6  Please advise.

## 2023-08-16 NOTE — Telephone Encounter (Signed)
 Spoke with pt and had explained that a 30 day rx for Eliquis  was sent to pt preferred pharmacy. Pt advised to look on [email or web address] for a discount card to use. Pt verbalized understanding.

## 2023-08-19 NOTE — Telephone Encounter (Signed)
 Attempted to contact the patient regarding the provider's response. No answer. Left a brief vm msg to return a call back to the clinic. Direct call back info provided.

## 2023-08-26 ENCOUNTER — Encounter: Payer: Self-pay | Admitting: Dermatology

## 2023-08-27 NOTE — Telephone Encounter (Signed)
 Second attempt to contact the patient regarding the provider's response. No answer. Phone keeps ringing, no option to leave a vm msg.

## 2023-09-02 ENCOUNTER — Ambulatory Visit: Payer: Self-pay | Admitting: Cardiology

## 2023-09-02 LAB — HEPATIC FUNCTION PANEL
ALT: 37 IU/L (ref 0–44)
AST: 25 IU/L (ref 0–40)
Albumin: 4.5 g/dL (ref 3.9–4.9)
Alkaline Phosphatase: 93 IU/L (ref 44–121)
Bilirubin Total: 0.9 mg/dL (ref 0.0–1.2)
Bilirubin, Direct: 0.32 mg/dL (ref 0.00–0.40)
Total Protein: 6.7 g/dL (ref 6.0–8.5)

## 2023-09-05 ENCOUNTER — Encounter: Payer: Self-pay | Admitting: Cardiology

## 2023-09-05 DIAGNOSIS — I480 Paroxysmal atrial fibrillation: Secondary | ICD-10-CM

## 2023-09-06 MED ORDER — APIXABAN 5 MG PO TABS: 5.0000 mg | ORAL_TABLET | Freq: Two times a day (BID) | ORAL | 1 refills | Status: DC

## 2023-09-06 NOTE — Telephone Encounter (Signed)
 Prescription refill request for Eliquis  received. Indication: Afib  Last office visit:06/09/23 ([place])  Scr: 1.11 (06/11/23)  Age: 63 Weight: 99.6kg  Appropriate dose. Refill sent.

## 2023-09-06 NOTE — Telephone Encounter (Signed)
 Contacted the patient regarding his concerns and the provider's recommendation. Per patient, the insurance problem was resolved and he was able to get his med. Patient mentioned that this week he will be back to his normal scheduled with his medication. This request has been handled. No further action is required.

## 2023-09-08 ENCOUNTER — Encounter: Payer: Self-pay | Admitting: Dermatology

## 2023-09-16 NOTE — Progress Notes (Signed)
 Remote ICD transmission.

## 2023-10-12 ENCOUNTER — Ambulatory Visit: Payer: PRIVATE HEALTH INSURANCE | Attending: Genetic Counselor | Admitting: Genetic Counselor

## 2023-10-12 DIAGNOSIS — I421 Obstructive hypertrophic cardiomyopathy: Secondary | ICD-10-CM

## 2023-10-14 NOTE — Progress Notes (Signed)
 Pre Test Genetic Consult  Referral Reason  Dale Rivera is referred for genetic consult and testing of hypertrophic cardiomyopathy.  Personal Medical Information Dale Rivera (III.1 on pedigree) is a pleasant 63 year-old Caucasian gentleman who works with Dale Rivera reports being diagnosed with HCM at 89 after having several syncopal events starting at age 62 while playing basketball.   He is mainly asymptomatic and denies dizziness, syncope, chest discomfort or dyspnea. Recent cardiac MRI (07/2021) detected severe asymmetric septal hypertrophy of 2.3 cm, LVEF 51% and scar burden OF 10%. Had an ICD implanted in May 2023. Also reports having Afib and has undergone an ablation for this.  Traditional Risk Factors Dale Rivera was diagnosed with HTN and reports that it is well-controlled with medication.  Family history His sister, Dale Rivera (III.3) is the proband in the family. She was seen by me at the [facility] in [place] for her HCM genetic consult. She declined genetic testing at that time as she does not have children.   Relation to Patient Pedigree # Current age Heart condition/age of onset Notes  Daughter IV.1 21 None Echo/EKG @ 20 normal        Brother III.5 59 None III.5- Echo/EKG normal   Sisters, 4 III.2- III.4, III.6 63, 62, 61,  III.6-Deceased III.3- HCN @ 30 III.2, III.4- Normal Echo/EKG  III.6- Died of pulmonary fibrosis @ 56  Nieces, Nephews IV.1- IV.5 39-21 None         Father II.6 Deceased 2x M.I , CABG @ <40 years Died @ 36 from M.I.  Paternal Aunts, 2 II.1-II.2 Deceased None Both died @ 56s-old age  Paternal uncles, 3 II.3-II.5 Deceased unaware II.3- Died @ 25-cardaic issues II.4,II.5- Died @ <60 from cardiac issues  Paternal grandfather 1.1 Deceased Cardiac issues-details? Died @ ?- cardiac issues  Paternal grandmother I.2 Deceased None Died @ 7s -old age        Mother II.7 Deceased HCM @ 4, Pacemaker @ 87, Afib Died of surgical complications @ 74 when removing  mass behind heart  Maternal uncle II.8 Deceased Cardiac issues, in a research study at [employer] Died of cardiac issues @ 26  Maternal aunts, 2 II.10-II.12 Deceased None II.69- Died of old age @ 45s II.10- died of ? @ 61s  Maternal grandfather I.3 Deceased Several M.I. (15x!) Died @ 46s from M.I.  Maternal grandmother I.4 Deceased None Died @ 81 - old agel   Genetics Dale Rivera was counseled on the genetics of hypertrophic cardiomyopathy (HCM). I explained to the patient that this is an autosomal dominant condition with incomplete penetrance i.e. not all individuals harboring the HCM mutation will present clinically with HCM, and age-related penetrance where clinical presentation of HCM increases with advanced age. Variability in clinical expression is also seen in families with HCM with affected family members presenting clinically at different ages and with symptoms ranging from mild to severe.  Since HCM is an autosomal dominant condition, first degree-relatives are at a 50% risk of inheriting this condition. They should seek regular surveillance for HCM.  Her first-degree relatives include her siblings. Clinical screening of first-degree relatives involves echocardiogram and EKG at regular intervals, frequency is typically determined by age, with those in their teens undergoing screening every year until the age of 59, and those over the age of 70 getting screened every 3-5 years until the age of 68. Patient verbalized understanding of this.  I informed the patient that about 8-10% of HCM patients can have compound and digenic mutations for HCM.  Also briefly discussed the inheritance pattern and treatment /management plans for the infiltrative cardiomyopathies that present as HCM phenocopies.   We walked through the process of genetic testing. I explained to the patient that genetic testing is a probabilistic test dependent upon age and severity of presentation, presence of risk factors for HCM and  importantly family history of HCM or sudden death in first-degree relatives.   The potential outcomes of genetic testing and subsequent management of at-risk family members were discussed so as to manage expectations-  If a mutation is not identified, then all first-degree relatives should undergo regular screening for HCM. Informed him that a negative genetic test confirms that he is not a HCM phenocopy. Patient verbalized understanding of this.  There is also the likelihood of identifying a "Variant of unknown significance." This result means that the variant has not been detected in a statistically significant number of HCM patients and/or functional studies have not been performed to verify its pathogenicity. This VUS can be tested in the family to see if it segregates with disease. If a VUS is found, first-degree relatives should undergo regular clinical screening for HCM.  If a pathogenic variant is reported, then first-degree family members can get tested for this variant. If they test positive, it is likely they will develop HCM. In light of variable expression and incomplete penetrance associated with HCM, it is not possible to predict when they will manifest clinically with HCM. It is recommended that family members that test positive for the familial pathogenic variant pursue clinical screening for HCM. Family members that test negative for the familial mutation need not pursue periodic screening for HCM, but seek care if symptoms develop.   Impression  Dale Rivera was found to have cardiac wall thickness suggestive of HCM at age 30 in the absence of other loading conditions. Additionally, he reports HCM in his sister and mother indicating that he likely inherited this condition from his mother.   Genetic testing is recommended to confirm his diagnosis. This test should include the major sarcomeric genes involved in HCM, namely MYBPPC3, MYH7, TNNI3, TNNT2, TPM1, ACTC1, MYL2 and MYL3. It should also  include the genes involved in HCM phenocopies as cardiac-predominant forms of these conditions present clinically as HCM. These include genes for Fabry disease (GLA), Danon disease (LAMP2), WPW syndrome (PRKAG2), Familial transthyretin amyloidosis (TTR) and phospholamban (PLN).  In addition, patient should be aware of the protections afforded by the Genetic Information Non-Discrimination Act (GINA). GINA protects them from losing their employment or health insurance based on their genotype. However, these protections do not cover life insurance and disability.   Please note that the patient has not been counseled in this visit on personal, cultural, or ethical issues that he may face due to his heart condition.   Plan After a thorough discussion of the risk and benefits of genetic testing for HCM, Worth defers genetic testing for HCM as he would like to have his insurance cost sharing portion reduced substantially. Since his insurance plan renewed in March 2025, he would like to revisit this in the fourth quarter of this year to see if he has reached his deductibles and coinsurance before he proceeds with the test. Meanwhile, he will discuss HCM screening guidelines and life insurance issues with his siblings.   Danford Pac, Ph.D, [facility] Clinical Molecular Geneticist

## 2023-10-26 ENCOUNTER — Encounter: Payer: Self-pay | Admitting: Dermatology

## 2023-10-26 ENCOUNTER — Ambulatory Visit (INDEPENDENT_AMBULATORY_CARE_PROVIDER_SITE_OTHER): Payer: PRIVATE HEALTH INSURANCE | Admitting: Dermatology

## 2023-10-26 VITALS — BP 132/87

## 2023-10-26 DIAGNOSIS — D1801 Hemangioma of skin and subcutaneous tissue: Secondary | ICD-10-CM | POA: Diagnosis not present

## 2023-10-26 DIAGNOSIS — L814 Other melanin hyperpigmentation: Secondary | ICD-10-CM

## 2023-10-26 DIAGNOSIS — L821 Other seborrheic keratosis: Secondary | ICD-10-CM

## 2023-10-26 DIAGNOSIS — W908XXA Exposure to other nonionizing radiation, initial encounter: Secondary | ICD-10-CM

## 2023-10-26 DIAGNOSIS — Z1283 Encounter for screening for malignant neoplasm of skin: Secondary | ICD-10-CM | POA: Diagnosis not present

## 2023-10-26 DIAGNOSIS — L578 Other skin changes due to chronic exposure to nonionizing radiation: Secondary | ICD-10-CM

## 2023-10-26 DIAGNOSIS — D229 Melanocytic nevi, unspecified: Secondary | ICD-10-CM

## 2023-10-26 NOTE — Patient Instructions (Signed)

## 2023-10-26 NOTE — Progress Notes (Signed)
   Total Body Skin Exam (TBSE) Visit   Subjective  Dale Rivera is a 63 y.o. male ESTABLISHED PATIENT who presents for the following:  Total Body Skin Exam (TBSE)   Patient does not have spots of concern to be evaluated. He does apply sunscreen and/or wears protective coverings. 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 Denied  family Hx of skin cancers.  The patient has spots, moles and lesions to be evaluated, some may be new or changing and the patient has concerns that these could be cancer.  The following portions of the chart were reviewed this encounter and updated as appropriate: medications, allergies, medical history  Review of Systems:  No other skin or systemic complaints except as noted in HPI or Assessment and Plan.  Objective  Well appearing patient in no apparent distress; mood and affect are within normal limits.  A full examination was performed including scalp, head, eyes, ears, nose, lips, neck, chest, axillae, abdomen, back, buttocks, bilateral upper extremities, bilateral lower extremities, hands, feet, fingers, toes, fingernails, and toenails. All findings within normal limits unless otherwise noted below.   Relevant physical exam findings are noted in the Assessment and Plan.    Assessment & Plan   LENTIGINES, SEBORRHEIC KERATOSES, HEMANGIOMAS - Benign normal skin lesions - Benign-appearing - Call for any changes  BENIGN MELANOCYTIC NEVI - Tan-brown and/or pink-flesh-colored symmetric macules and papules - Benign appearing on exam today - Observation - Call clinic for new or changing moles - Recommend daily use of broad spectrum spf 30+ sunscreen to sun-exposed areas.   MILD ACTINIC DAMAGE - Chronic condition, secondary to cumulative UV/sun exposure - diffuse scaly erythematous macules with underlying dyspigmentation - Recommend daily broad spectrum sunscreen  SPF 30+ to sun-exposed areas, reapply every 2 hours as needed.  - Staying in the shade or wearing long sleeves, sun glasses (UVA+UVB protection) and wide brim hats (4-inch brim around the entire circumference of the hat) are also recommended for sun protection.  - Call for new or changing lesions.   SKIN CANCER SCREENING PERFORMED TODAY.    Return in about 6 months (around 04/27/2024) for TBSE.   Documentation: I have reviewed the above documentation for accuracy and completeness, and I agree with the above.  I, Quaniya Damas Maranda, CMA, am acting as scribe for [employer], DO.   Delon Lenis, DO

## 2023-11-03 ENCOUNTER — Encounter: Payer: Self-pay | Admitting: Medical-Surgical

## 2023-11-04 ENCOUNTER — Ambulatory Visit: Payer: PRIVATE HEALTH INSURANCE

## 2023-11-04 DIAGNOSIS — I421 Obstructive hypertrophic cardiomyopathy: Secondary | ICD-10-CM

## 2023-11-04 LAB — CUP PACEART REMOTE DEVICE CHECK
Date Time Interrogation Session: 20250731103939
Implantable Lead Connection Status: 753985
Implantable Lead Implant Date: 20230502
Implantable Lead Location: 753860
Implantable Lead Model: 436909
Implantable Lead Serial Number: 81487767
Implantable Pulse Generator Implant Date: 20230502
Pulse Gen Model: 429525
Pulse Gen Serial Number: 84895165

## 2023-11-04 MED ORDER — BACLOFEN 10 MG PO TABS: 10.0000 mg | ORAL_TABLET | Freq: Three times a day (TID) | ORAL | 0 refills | Status: AC

## 2023-11-04 MED ORDER — PREGABALIN 50 MG PO CAPS: ORAL_CAPSULE | ORAL | 0 refills | Status: DC

## 2023-11-04 NOTE — Addendum Note (Signed)
 Addended byBETHA WILLO MINI on: 11/04/2023 11:43 AM   Modules accepted: Orders

## 2023-11-04 NOTE — Addendum Note (Signed)
 Addended byBETHA WILLO MINI on: 11/04/2023 09:26 PM   Modules accepted: Orders

## 2023-11-08 ENCOUNTER — Ambulatory Visit: Payer: Self-pay | Admitting: Cardiology

## 2023-11-16 DIAGNOSIS — Z7901 Long term (current) use of anticoagulants: Secondary | ICD-10-CM | POA: Insufficient documentation

## 2023-11-16 DIAGNOSIS — Z95810 Presence of automatic (implantable) cardiac defibrillator: Secondary | ICD-10-CM | POA: Insufficient documentation

## 2023-11-19 ENCOUNTER — Other Ambulatory Visit: Payer: Self-pay | Admitting: Medical-Surgical

## 2023-11-19 ENCOUNTER — Other Ambulatory Visit: Payer: Self-pay | Admitting: Cardiology

## 2023-11-19 DIAGNOSIS — E7800 Pure hypercholesterolemia, unspecified: Secondary | ICD-10-CM

## 2023-11-19 DIAGNOSIS — M1A9XX0 Chronic gout, unspecified, without tophus (tophi): Secondary | ICD-10-CM

## 2023-11-19 DIAGNOSIS — I421 Obstructive hypertrophic cardiomyopathy: Secondary | ICD-10-CM

## 2023-11-21 NOTE — Progress Notes (Unsigned)
  Electrophysiology Office Note:   ID:  Dale Rivera, [name] 1960-06-02, MRN [id number]  Primary Cardiologist: Redell Shallow, MD Electrophysiologist: OLE ONEIDA HOLTS, MD  {Click to update primary MD,subspecialty MD or APP then REFRESH:1}    History of Present Illness:   Dale Rivera is a 63 y.o. male with h/o  persistent AF and HOCM s/p ICD seen today for {VISITTYPE:28148}  Review of systems complete and found to be negative unless listed in HPI.   EP Information / Studies Reviewed:    {EKGtoday:28818}       ICD Interrogation-  reviewed in detail today,  See PACEART report.  Arrhythmia/Device History Biotronik Single Chamber ICD implanted 08/2021 for HOCM  S/p PVI and CTI 11/2021   Physical Exam:   VS:  There were no vitals taken for this visit.   Wt Readings from Last 3 Encounters:  06/23/23 219 lb 8 oz (99.6 kg)  06/09/23 219 lb (99.3 kg)  04/27/23 220 lb 6.4 oz (100 kg)     GEN: No acute distress *** NECK: No JVD; No carotid bruits CARDIAC: {EPRHYTHM:28826}, no murmurs, rubs, gallops RESPIRATORY:  Clear to auscultation without rales, wheezing or rhonchi  ABDOMEN: Soft, non-tender, non-distended EXTREMITIES:  {EDEMA LEVEL:28147::No} edema; No deformity   ASSESSMENT AND PLAN:    HCM  s/p Biotronik single chamber ICD  euvolemic today Stable on an appropriate medical regimen Normal ICD function See Pace Art report No changes today  Persistent AF EKG today shows *** *** Increasing burden   Disposition:   Follow up with {EPPROVIDERS:28135::EP Team} {EPFOLLOW UP:28173}   Signed, Ozell Prentice Passey, PA-C

## 2023-11-22 ENCOUNTER — Ambulatory Visit: Payer: PRIVATE HEALTH INSURANCE | Attending: Student | Admitting: Student

## 2023-11-22 ENCOUNTER — Encounter: Payer: Self-pay | Admitting: Student

## 2023-11-22 VITALS — BP 128/83 | HR 70 | Ht 70.0 in | Wt 215.0 lb

## 2023-11-22 DIAGNOSIS — I421 Obstructive hypertrophic cardiomyopathy: Secondary | ICD-10-CM

## 2023-11-22 DIAGNOSIS — I4819 Other persistent atrial fibrillation: Secondary | ICD-10-CM | POA: Diagnosis not present

## 2023-11-22 DIAGNOSIS — I10 Essential (primary) hypertension: Secondary | ICD-10-CM

## 2023-11-22 NOTE — Patient Instructions (Signed)
 Medication Instructions:  Your physician recommends that you continue on your current medications as directed. Please refer to the Current Medication list given to you today.  *If you need a refill on your cardiac medications before your next appointment, please call your pharmacy*  Lab Work: None ordered If you have labs (blood work) drawn today and your tests are completely normal, you will receive your results only by: MyChart Message (if you have MyChart) OR A paper copy in the mail If you have any lab test that is abnormal or we need to change your treatment, we will call you to review the results  Follow-Up: At [facility], you and your health needs are our priority.  As part of our continuing mission to provide you with exceptional heart care, our providers are all part of one team.  This team includes your primary Cardiologist (physician) and Advanced Practice Providers or APPs (Physician Assistants and Nurse Practitioners) who all work together to provide you with the care you need, when you need it.  Your next appointment:   6 month(s)  Provider:   Ole Holts, MD

## 2023-12-22 ENCOUNTER — Encounter: Payer: Self-pay | Admitting: Medical-Surgical

## 2023-12-22 ENCOUNTER — Ambulatory Visit (INDEPENDENT_AMBULATORY_CARE_PROVIDER_SITE_OTHER): Payer: PRIVATE HEALTH INSURANCE | Admitting: Medical-Surgical

## 2023-12-22 VITALS — BP 110/69 | HR 73 | Resp 20 | Ht 70.0 in | Wt 210.0 lb

## 2023-12-22 DIAGNOSIS — Z23 Encounter for immunization: Secondary | ICD-10-CM | POA: Diagnosis not present

## 2023-12-22 DIAGNOSIS — E119 Type 2 diabetes mellitus without complications: Secondary | ICD-10-CM

## 2023-12-22 DIAGNOSIS — I421 Obstructive hypertrophic cardiomyopathy: Secondary | ICD-10-CM

## 2023-12-22 DIAGNOSIS — E349 Endocrine disorder, unspecified: Secondary | ICD-10-CM

## 2023-12-22 DIAGNOSIS — M1A9XX0 Chronic gout, unspecified, without tophus (tophi): Secondary | ICD-10-CM

## 2023-12-22 DIAGNOSIS — R0982 Postnasal drip: Secondary | ICD-10-CM

## 2023-12-22 DIAGNOSIS — Z7985 Long-term (current) use of injectable non-insulin antidiabetic drugs: Secondary | ICD-10-CM

## 2023-12-22 DIAGNOSIS — N529 Male erectile dysfunction, unspecified: Secondary | ICD-10-CM

## 2023-12-22 LAB — POCT UA - MICROALBUMIN
Albumin/Creatinine Ratio, Urine, POC: <30
Creatinine, POC: 100 mg/dL
Microalbumin Ur, POC: 10 mg/L

## 2023-12-22 LAB — POCT GLYCOSYLATED HEMOGLOBIN (HGB A1C)
HbA1c, POC (controlled diabetic range): 5.9 % (ref 0.0–7.0)
Hemoglobin A1C: 5.9 % — AB (ref 4.0–5.6)

## 2023-12-22 MED ORDER — ALLOPURINOL 100 MG PO TABS: 100.0000 mg | ORAL_TABLET | Freq: Two times a day (BID) | ORAL | 3 refills | Status: AC

## 2023-12-22 MED ORDER — AMLODIPINE BESYLATE 5 MG PO TABS: 5.0000 mg | ORAL_TABLET | Freq: Every day | ORAL | 3 refills | Status: AC

## 2023-12-22 MED ORDER — IPRATROPIUM BROMIDE 0.03 % NA SOLN: 2.0000 | Freq: Two times a day (BID) | NASAL | 0 refills | Status: AC | PRN

## 2023-12-22 MED ORDER — SEMAGLUTIDE (2 MG/DOSE) 8 MG/3ML ~~LOC~~ SOPN: 2.0000 mg | PEN_INJECTOR | SUBCUTANEOUS | 3 refills | Status: AC

## 2023-12-22 NOTE — Assessment & Plan Note (Signed)
 Managed with Cialis  5mg  daily as needed, effective and well tolerated.  - Continue Cialis  as prescribed.

## 2023-12-22 NOTE — Assessment & Plan Note (Signed)
 A1c of 5.9% at last check and on recheck today indicating excellent control. Ozempic  2mg  weekly well tolerated, effective.  Weight loss and regular exercise noted. - Continue Ozempic  as prescribed.  - Monitor glucose 2-3 times weekly with a fasting goal of 90-120.  - Continue to follow diabetic diet recommendations.

## 2023-12-22 NOTE — Assessment & Plan Note (Signed)
 Last level in June 2023 was low normal. Not on replacement therapy. Still experience fatigue and low libido. - Order testosterone  level.

## 2023-12-22 NOTE — Assessment & Plan Note (Signed)
 Managed well with allopurinol  100mg  BID. No recent flares. Recent labs missing uric acid level. - Order uric acid level.

## 2023-12-22 NOTE — Assessment & Plan Note (Signed)
 BP well managed with Amlodipine  with normal readings in office today. Home monitoring with readings at goal. Regular exercise and dietary modifications have helped with weight loss.  - Continue Amlodipine  5mg  daily.

## 2023-12-22 NOTE — Progress Notes (Signed)
 Established patient visit   History of Present Illness   Discussed the use of AI scribe software for clinical note transcription with the patient, who gave verbal consent to proceed.  History of Present Illness   Dale Rivera is a 63 year old male who presents for a follow-up visit and vaccination.  Chronic postnasal drip and laryngeal symptoms - Chronic postnasal drip for several weeks,  affecting his voice - History of laryngitis related to postnasal drip - Antibiotic prescribed three weeks ago was ineffective - Allergy medications have not provided relief - No chest pain, shortness of breath, cough, or wheezing  Hypertension management - Hypertension managed with amlodipine  5mg  daily - Home blood pressure readings typically around 120/80 mmHg - Today's blood pressure reading is 110/69 mmHg - Regularly monitors blood pressure at home - Requires medication refill  Type 2 diabetes and lifestyle modification - Type 2 diabetes with most recent A1c of 5.9% - Engages in regular exercise, including strength training and playing golf - Taking Ozempic  2mg  weekly, well tolerated - Actively working on dietary improvements   Erectile dysfunction - Managed with Cialis  5mg  daily prn, effective, well tolerated  Gout - Taking Allopurinol  100mg  BID, well tolerated - No recent flares  Testosterone  deficiency  - Most recent check in 2023 on the low side of normal - No on TRT - Still experiencing fatigue and low libido      Physical Exam   Physical Exam Vitals and nursing note reviewed.  Constitutional:      General: He is not in acute distress.    Appearance: Normal appearance. He is not ill-appearing.  HENT:     Head: Normocephalic and atraumatic.  Cardiovascular:     Rate and Rhythm: Normal rate and regular rhythm.     Pulses: Normal pulses.     Heart sounds: Normal heart sounds. No murmur heard.    No friction rub. No gallop.  Pulmonary:     Effort: Pulmonary  effort is normal. No respiratory distress.     Breath sounds: Normal breath sounds.  Skin:    General: Skin is warm and dry.  Neurological:     Mental Status: He is alert and oriented to person, place, and time.  Psychiatric:        Mood and Affect: Mood normal.        Behavior: Behavior normal.        Thought Content: Thought content normal.        Judgment: Judgment normal.    Assessment & Plan   Problem List Items Addressed This Visit       Cardiovascular and Mediastinum   Hypertrophic obstructive cardiomyopathy (HCC)   BP well managed with Amlodipine  with normal readings in office today. Home monitoring with readings at goal. Regular exercise and dietary modifications have helped with weight loss.  - Continue Amlodipine  5mg  daily.       Relevant Medications   amLODipine  (NORVASC ) 5 MG tablet     Endocrine   Type 2 diabetes mellitus without complication, without long-term current use of insulin (HCC)   A1c of 5.9% at last check and on recheck today indicating excellent control. Ozempic  2mg  weekly well tolerated, effective.  Weight loss and regular exercise noted. - Continue Ozempic  as prescribed.  - Monitor glucose 2-3 times weekly with a fasting goal of 90-120.  - Continue to follow diabetic diet recommendations.       Relevant Medications   Semaglutide , 2 MG/DOSE,  8 MG/3ML SOPN   Other Relevant Orders   POCT HgB A1C (Completed)   POCT UA - Microalbumin (Completed)     Other   Erectile dysfunction   Managed with Cialis  5mg  daily as needed, effective and well tolerated.  - Continue Cialis  as prescribed.       Gout - Primary   Managed well with allopurinol  100mg  BID. No recent flares. Recent labs missing uric acid level. - Order uric acid level.      Relevant Medications   allopurinol  (ZYLOPRIM ) 100 MG tablet   Other Relevant Orders   Uric acid   Testosterone  deficiency   Last level in June 2023 was low normal. Not on replacement therapy. Still experience  fatigue and low libido. - Order testosterone  level.       Relevant Orders   Testosterone    Other Visit Diagnoses       Immunization due       Relevant Orders   Flu vaccine HIGH DOSE PF(Fluzone Trivalent) (Completed)   Varicella-zoster vaccine IM (Completed)     Postnasal drip              Chronic postnasal drip Persistent symptoms despite allergy medication. - Prescribed Atrovent  nasal spray, two sprays in each nostril as needed, up to twice daily. - Educated on using the spray in the symptomatic nostril.   Follow up   Return in about 6 months (around 06/20/2024) for DM/HTN/HLD follow up. __________________________________ Zada FREDRIK Palin, DNP, APRN, FNP-BC Primary Care and Sports Medicine [facility] [place]

## 2023-12-23 ENCOUNTER — Ambulatory Visit: Payer: Self-pay | Admitting: Medical-Surgical

## 2023-12-23 LAB — URIC ACID: Uric Acid: 3.8 mg/dL (ref 3.8–8.4)

## 2023-12-23 LAB — TESTOSTERONE: Testosterone: 584 ng/dL (ref 264–916)

## 2024-01-05 NOTE — Progress Notes (Signed)
 Remote ICD Transmission

## 2024-01-14 ENCOUNTER — Other Ambulatory Visit: Payer: Self-pay

## 2024-01-14 MED ORDER — TADALAFIL 5 MG PO TABS: 5.0000 mg | ORAL_TABLET | Freq: Every day | ORAL | 5 refills | Status: AC | PRN

## 2024-02-03 ENCOUNTER — Ambulatory Visit: Payer: PRIVATE HEALTH INSURANCE

## 2024-02-03 DIAGNOSIS — I480 Paroxysmal atrial fibrillation: Secondary | ICD-10-CM | POA: Diagnosis not present

## 2024-02-04 LAB — CUP PACEART REMOTE DEVICE CHECK
Date Time Interrogation Session: 20251030093236
Implantable Lead Connection Status: 753985
Implantable Lead Implant Date: 20230502
Implantable Lead Location: 753860
Implantable Lead Model: 436909
Implantable Lead Serial Number: 81487767
Implantable Pulse Generator Implant Date: 20230502
Pulse Gen Model: 429525
Pulse Gen Serial Number: 84895165

## 2024-02-08 ENCOUNTER — Ambulatory Visit: Payer: Self-pay | Admitting: Cardiology

## 2024-02-08 NOTE — Progress Notes (Signed)
 Remote ICD Transmission

## 2024-03-09 ENCOUNTER — Other Ambulatory Visit: Payer: Self-pay | Admitting: Cardiology

## 2024-03-09 DIAGNOSIS — I480 Paroxysmal atrial fibrillation: Secondary | ICD-10-CM

## 2024-03-09 MED ORDER — APIXABAN 5 MG PO TABS: 5.0000 mg | ORAL_TABLET | Freq: Two times a day (BID) | ORAL | 1 refills | Status: AC

## 2024-03-09 NOTE — Telephone Encounter (Signed)
 Prescription refill request for Eliquis  received. Indication:afib Last office visit:8/25 Scr: 1.11  3/25 Age:63 Weight:95.3  kg  Prescription refilled

## 2024-03-27 ENCOUNTER — Other Ambulatory Visit: Payer: Self-pay | Admitting: Cardiology

## 2024-03-27 DIAGNOSIS — E7800 Pure hypercholesterolemia, unspecified: Secondary | ICD-10-CM

## 2024-03-28 MED ORDER — ATORVASTATIN CALCIUM 80 MG PO TABS: 80.0000 mg | ORAL_TABLET | Freq: Every day | ORAL | 1 refills | Status: AC

## 2024-03-31 ENCOUNTER — Telehealth: Payer: Self-pay

## 2024-03-31 NOTE — Telephone Encounter (Signed)
 BIOTRONIK ICD W/DX LEAD: ALERT   RV threshold measurement repetitively failed for at least 7 times No valid measurement since Mar 21, 2024, 1:25:02 AM - Last reason was signal quality check on Mar 31, 2024, 1:25:02 AM    Reviewed with Warren from Biotronik:  There is no noise, impedance and sensing are very stable and this is an old, well established lead without concern for micro dislodgement.   Recommend just monitoring and testing at next OV as patient is due in the next month for annual follow up. (Appears in past patient's RV lead was turned to automatic which suggests this was likely seen in the past and turned off of on which would cause auto adjusting and throwing patient in to a higher output unnecessarily).  Patient VP 0%.   She does mention considering the following programming adjustments:  Turning on early ATP  Turn AT/AF detection down to 160 from 200.

## 2024-04-13 ENCOUNTER — Ambulatory Visit: Payer: PRIVATE HEALTH INSURANCE | Admitting: Dermatology

## 2024-05-03 ENCOUNTER — Ambulatory Visit: Admitting: Dermatology

## 2024-05-03 ENCOUNTER — Encounter: Payer: Self-pay | Admitting: Dermatology

## 2024-05-03 DIAGNOSIS — Z1283 Encounter for screening for malignant neoplasm of skin: Secondary | ICD-10-CM | POA: Diagnosis not present

## 2024-05-03 DIAGNOSIS — L820 Inflamed seborrheic keratosis: Secondary | ICD-10-CM | POA: Diagnosis not present

## 2024-05-03 DIAGNOSIS — D485 Neoplasm of uncertain behavior of skin: Secondary | ICD-10-CM

## 2024-05-03 DIAGNOSIS — L814 Other melanin hyperpigmentation: Secondary | ICD-10-CM | POA: Diagnosis not present

## 2024-05-03 DIAGNOSIS — W908XXA Exposure to other nonionizing radiation, initial encounter: Secondary | ICD-10-CM

## 2024-05-03 DIAGNOSIS — L821 Other seborrheic keratosis: Secondary | ICD-10-CM | POA: Diagnosis not present

## 2024-05-03 DIAGNOSIS — D229 Melanocytic nevi, unspecified: Secondary | ICD-10-CM

## 2024-05-03 DIAGNOSIS — D1801 Hemangioma of skin and subcutaneous tissue: Secondary | ICD-10-CM

## 2024-05-03 DIAGNOSIS — L578 Other skin changes due to chronic exposure to nonionizing radiation: Secondary | ICD-10-CM

## 2024-05-03 NOTE — Patient Instructions (Signed)

## 2024-05-03 NOTE — Progress Notes (Signed)
 "  Total Body Skin Exam (TBSE) Visit  Patient (and/or pt guardian) consented to the use of AI-assisted tools for note generation.    Subjective  Dale Rivera is a 64 y.o. male who presents for the following: Skin Cancer Screening and Full Body Skin Exam  Patient presents today for follow up visit for TBSE.  Patient was last evaluated on 10/26/23 .  Patient denies medication changes.  Patient reports he does not have spots, moles and lesions of concern to be evaluated.  Patient reports throughout his  lifetime he has had moderate sun exposure.  Currently, patient reports if he  has excessive sun exposure, he does apply sunscreen and/or wears protective coverings.  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  The patient has spots, moles and lesions to be evaluated, some may be new or changing and the patient has concerns that these could be cancer.  The following portions of the chart were reviewed this encounter and updated as appropriate: medications, allergies, medical history  Review of Systems:  No other skin or systemic complaints except as noted in HPI or Assessment and Plan.  Objective  Well appearing patient in no apparent distress; mood and affect are within normal limits.  A full examination was performed including scalp, head, eyes, ears, nose, lips, neck, chest, axillae, abdomen, back, buttocks, bilateral upper extremities, bilateral lower extremities, hands, feet, fingers, toes, fingernails, and toenails. All findings within normal limits unless otherwise noted below.   Relevant physical exam findings are noted in the Assessment and Plan. Left Upper Arm - Posterior 8 mm pink flat top papule    Assessment & Plan   LENTIGINES, SEBORRHEIC  KERATOSES, HEMANGIOMAS - Benign normal skin lesions - Benign-appearing - Call for any changes  MELANOCYTIC NEVI - Tan-brown and/or pink-flesh-colored symmetric macules and papules - Benign appearing on exam today - Observation - Call clinic for new or changing moles - Recommend daily use of broad spectrum spf 30+ sunscreen to sun-exposed areas.   ACTINIC DAMAGE - Chronic condition, secondary to cumulative UV/sun exposure - diffuse scaly erythematous macules with underlying dyspigmentation - Recommend daily broad spectrum sunscreen SPF 30+ to sun-exposed areas, reapply every 2 hours as needed.  - Staying in the shade or wearing long sleeves, sun glasses (UVA+UVB protection) and wide brim hats (4-inch brim around the entire circumference of the hat) are also recommended for sun protection.  - Call for new or changing lesions.  SKIN CANCER SCREENING PERFORMED TODAY.  Recommended daily moisturizer with sunscreen  8mm pink flat top papule left upper arm NEOPLASM OF UNCERTAIN BEHAVIOR OF SKIN Left Upper Arm - Posterior - Epidermal / dermal shaving  Lesion diameter (cm):  0.8 Informed consent: discussed and consent obtained   Timeout: patient name, date of birth, surgical site, and procedure verified   Procedure prep:  Patient was prepped and draped in usual sterile fashion Prep type:  Isopropyl alcohol Instrument used: DermaBlade   Hemostasis achieved with: aluminum chloride   Outcome: patient tolerated procedure well   Post-procedure details: sterile dressing applied and wound care instructions given   Dressing type: petrolatum and bandage    Specimen A - Surgical pathology Differential Diagnosis: r/o DN  Check Margins: No SKIN EXAM FOR MALIGNANT NEOPLASM  MULTIPLE BENIGN MELANOCYTIC NEVI   CHERRY ANGIOMA   LENTIGINES   SEBORRHEIC KERATOSIS   ACTINIC SKIN DAMAGE   Return in 1 year (on 05/03/2025) for TBSE f/u.  LILLETTE Lyle Cords, am acting as a neurosurgeon for  [employer], DO .   Documentation: I have reviewed the above documentation for accuracy and completeness, and I agree with the above.  Delon Lenis, DO   "

## 2024-05-04 ENCOUNTER — Ambulatory Visit: Payer: Self-pay | Admitting: Cardiology

## 2024-05-04 ENCOUNTER — Ambulatory Visit: Attending: Cardiology

## 2024-05-04 DIAGNOSIS — I429 Cardiomyopathy, unspecified: Secondary | ICD-10-CM

## 2024-05-04 LAB — CUP PACEART REMOTE DEVICE CHECK
Date Time Interrogation Session: 20260129084026
Implantable Lead Connection Status: 753985
Implantable Lead Implant Date: 20230502
Implantable Lead Location: 753860
Implantable Lead Model: 436909
Implantable Lead Serial Number: 81487767
Implantable Pulse Generator Implant Date: 20230502
Pulse Gen Model: 429525
Pulse Gen Serial Number: 84895165

## 2024-05-04 LAB — SURGICAL PATHOLOGY

## 2024-05-09 ENCOUNTER — Ambulatory Visit: Payer: Self-pay | Admitting: Dermatology

## 2024-05-09 NOTE — Progress Notes (Signed)
 Remote ICD Transmission

## 2024-05-22 ENCOUNTER — Ambulatory Visit: Admitting: Cardiology

## 2024-06-21 ENCOUNTER — Ambulatory Visit: Admitting: Cardiology

## 2024-06-22 ENCOUNTER — Ambulatory Visit: Admitting: Medical-Surgical

## 2024-08-03 ENCOUNTER — Ambulatory Visit

## 2024-11-02 ENCOUNTER — Ambulatory Visit

## 2025-02-01 ENCOUNTER — Ambulatory Visit

## 2025-05-03 ENCOUNTER — Ambulatory Visit
